# Patient Record
Sex: Female | Born: 1960 | Race: Black or African American | Hispanic: No | Marital: Single | Smoking: Current some day smoker
Health system: Southern US, Community
[De-identification: ages and names within clinical notes are randomized; demographics above are authoritative.]

## PROBLEM LIST (undated history)

## (undated) DIAGNOSIS — R7689 Other specified abnormal immunological findings in serum: Secondary | ICD-10-CM

## (undated) DIAGNOSIS — K219 Gastro-esophageal reflux disease without esophagitis: Secondary | ICD-10-CM

## (undated) DIAGNOSIS — T8859XA Other complications of anesthesia, initial encounter: Secondary | ICD-10-CM

## (undated) DIAGNOSIS — M1990 Unspecified osteoarthritis, unspecified site: Secondary | ICD-10-CM

## (undated) DIAGNOSIS — M62830 Muscle spasm of back: Secondary | ICD-10-CM

## (undated) DIAGNOSIS — Z9889 Other specified postprocedural states: Secondary | ICD-10-CM

## (undated) DIAGNOSIS — L309 Dermatitis, unspecified: Secondary | ICD-10-CM

## (undated) DIAGNOSIS — B999 Unspecified infectious disease: Secondary | ICD-10-CM

## (undated) DIAGNOSIS — R0609 Other forms of dyspnea: Secondary | ICD-10-CM

## (undated) DIAGNOSIS — I10 Essential (primary) hypertension: Secondary | ICD-10-CM

## (undated) DIAGNOSIS — E119 Type 2 diabetes mellitus without complications: Secondary | ICD-10-CM

## (undated) DIAGNOSIS — R768 Other specified abnormal immunological findings in serum: Secondary | ICD-10-CM

## (undated) DIAGNOSIS — M62838 Other muscle spasm: Secondary | ICD-10-CM

## (undated) DIAGNOSIS — E7800 Pure hypercholesterolemia, unspecified: Secondary | ICD-10-CM

## (undated) DIAGNOSIS — T4145XA Adverse effect of unspecified anesthetic, initial encounter: Secondary | ICD-10-CM

## (undated) DIAGNOSIS — R112 Nausea with vomiting, unspecified: Secondary | ICD-10-CM

## (undated) DIAGNOSIS — H409 Unspecified glaucoma: Secondary | ICD-10-CM

## (undated) DIAGNOSIS — R0600 Dyspnea, unspecified: Secondary | ICD-10-CM

## (undated) HISTORY — DX: Pure hypercholesterolemia, unspecified: E78.00

## (undated) HISTORY — PX: TUBAL LIGATION: SHX77

## (undated) HISTORY — PX: SHOULDER SURGERY: SHX246

## (undated) HISTORY — DX: Other specified abnormal immunological findings in serum: R76.89

## (undated) HISTORY — DX: Unspecified glaucoma: H40.9

## (undated) HISTORY — DX: Other forms of dyspnea: R06.09

## (undated) HISTORY — DX: Essential (primary) hypertension: I10

## (undated) HISTORY — DX: Other specified abnormal immunological findings in serum: R76.8

## (undated) HISTORY — PX: JOINT REPLACEMENT: SHX530

## (undated) HISTORY — PX: MOUTH SURGERY: SHX715

## (undated) HISTORY — DX: Type 2 diabetes mellitus without complications: E11.9

## (undated) HISTORY — DX: Dyspnea, unspecified: R06.00

---

## 2000-12-26 ENCOUNTER — Encounter: Payer: Self-pay | Admitting: Internal Medicine

## 2000-12-26 ENCOUNTER — Emergency Department (HOSPITAL_COMMUNITY): Admission: EM | Admit: 2000-12-26 | Discharge: 2000-12-26 | Payer: Self-pay | Admitting: Internal Medicine

## 2001-08-15 ENCOUNTER — Emergency Department (HOSPITAL_COMMUNITY): Admission: EM | Admit: 2001-08-15 | Discharge: 2001-08-15 | Payer: Self-pay | Admitting: *Deleted

## 2001-10-01 ENCOUNTER — Emergency Department (HOSPITAL_COMMUNITY): Admission: EM | Admit: 2001-10-01 | Discharge: 2001-10-01 | Payer: Self-pay | Admitting: Emergency Medicine

## 2001-10-05 ENCOUNTER — Encounter: Payer: Self-pay | Admitting: Emergency Medicine

## 2001-10-05 ENCOUNTER — Emergency Department (HOSPITAL_COMMUNITY): Admission: EM | Admit: 2001-10-05 | Discharge: 2001-10-05 | Payer: Self-pay | Admitting: Emergency Medicine

## 2001-11-06 ENCOUNTER — Emergency Department (HOSPITAL_COMMUNITY): Admission: EM | Admit: 2001-11-06 | Discharge: 2001-11-06 | Payer: Self-pay | Admitting: Emergency Medicine

## 2001-11-24 ENCOUNTER — Emergency Department (HOSPITAL_COMMUNITY): Admission: EM | Admit: 2001-11-24 | Discharge: 2001-11-24 | Payer: Self-pay | Admitting: *Deleted

## 2002-05-03 ENCOUNTER — Emergency Department (HOSPITAL_COMMUNITY): Admission: EM | Admit: 2002-05-03 | Discharge: 2002-05-03 | Payer: Self-pay | Admitting: Emergency Medicine

## 2002-08-05 ENCOUNTER — Emergency Department (HOSPITAL_COMMUNITY): Admission: EM | Admit: 2002-08-05 | Discharge: 2002-08-05 | Payer: Self-pay | Admitting: Emergency Medicine

## 2002-08-22 ENCOUNTER — Emergency Department (HOSPITAL_COMMUNITY): Admission: EM | Admit: 2002-08-22 | Discharge: 2002-08-22 | Payer: Self-pay | Admitting: Emergency Medicine

## 2002-09-18 ENCOUNTER — Emergency Department (HOSPITAL_COMMUNITY): Admission: EM | Admit: 2002-09-18 | Discharge: 2002-09-18 | Payer: Self-pay | Admitting: Emergency Medicine

## 2002-10-03 ENCOUNTER — Emergency Department (HOSPITAL_COMMUNITY): Admission: EM | Admit: 2002-10-03 | Discharge: 2002-10-03 | Payer: Self-pay | Admitting: Emergency Medicine

## 2003-01-07 ENCOUNTER — Emergency Department (HOSPITAL_COMMUNITY): Admission: EM | Admit: 2003-01-07 | Discharge: 2003-01-07 | Payer: Self-pay | Admitting: Internal Medicine

## 2003-01-17 ENCOUNTER — Ambulatory Visit (HOSPITAL_COMMUNITY): Admission: RE | Admit: 2003-01-17 | Discharge: 2003-01-17 | Payer: Self-pay | Admitting: Orthopaedic Surgery

## 2003-01-17 ENCOUNTER — Encounter: Payer: Self-pay | Admitting: Orthopaedic Surgery

## 2003-05-28 ENCOUNTER — Emergency Department (HOSPITAL_COMMUNITY): Admission: EM | Admit: 2003-05-28 | Discharge: 2003-05-28 | Payer: Self-pay | Admitting: Emergency Medicine

## 2003-05-28 ENCOUNTER — Encounter: Payer: Self-pay | Admitting: Emergency Medicine

## 2003-06-23 ENCOUNTER — Emergency Department (HOSPITAL_COMMUNITY): Admission: EM | Admit: 2003-06-23 | Discharge: 2003-06-23 | Payer: Self-pay | Admitting: Emergency Medicine

## 2003-10-28 ENCOUNTER — Emergency Department (HOSPITAL_COMMUNITY): Admission: EM | Admit: 2003-10-28 | Discharge: 2003-10-28 | Payer: Self-pay | Admitting: Emergency Medicine

## 2004-01-20 ENCOUNTER — Emergency Department (HOSPITAL_COMMUNITY): Admission: EM | Admit: 2004-01-20 | Discharge: 2004-01-20 | Payer: Self-pay | Admitting: *Deleted

## 2004-03-05 ENCOUNTER — Emergency Department (HOSPITAL_COMMUNITY): Admission: EM | Admit: 2004-03-05 | Discharge: 2004-03-05 | Payer: Self-pay | Admitting: Emergency Medicine

## 2004-05-27 ENCOUNTER — Emergency Department (HOSPITAL_COMMUNITY): Admission: EM | Admit: 2004-05-27 | Discharge: 2004-05-27 | Payer: Self-pay | Admitting: Emergency Medicine

## 2004-06-27 ENCOUNTER — Emergency Department (HOSPITAL_COMMUNITY): Admission: EM | Admit: 2004-06-27 | Discharge: 2004-06-27 | Payer: Self-pay | Admitting: Emergency Medicine

## 2004-07-16 ENCOUNTER — Ambulatory Visit (HOSPITAL_COMMUNITY): Admission: RE | Admit: 2004-07-16 | Discharge: 2004-07-16 | Payer: Self-pay | Admitting: Orthopaedic Surgery

## 2005-05-09 ENCOUNTER — Emergency Department (HOSPITAL_COMMUNITY): Admission: EM | Admit: 2005-05-09 | Discharge: 2005-05-09 | Payer: Self-pay | Admitting: Emergency Medicine

## 2005-12-24 ENCOUNTER — Emergency Department (HOSPITAL_COMMUNITY): Admission: EM | Admit: 2005-12-24 | Discharge: 2005-12-24 | Payer: Self-pay | Admitting: Emergency Medicine

## 2006-02-24 ENCOUNTER — Emergency Department (HOSPITAL_COMMUNITY): Admission: EM | Admit: 2006-02-24 | Discharge: 2006-02-24 | Payer: Self-pay | Admitting: Emergency Medicine

## 2006-04-19 ENCOUNTER — Emergency Department (HOSPITAL_COMMUNITY): Admission: EM | Admit: 2006-04-19 | Discharge: 2006-04-19 | Payer: Self-pay | Admitting: Emergency Medicine

## 2006-10-02 ENCOUNTER — Emergency Department (HOSPITAL_COMMUNITY): Admission: EM | Admit: 2006-10-02 | Discharge: 2006-10-02 | Payer: Self-pay | Admitting: Emergency Medicine

## 2007-08-28 ENCOUNTER — Emergency Department (HOSPITAL_COMMUNITY): Admission: EM | Admit: 2007-08-28 | Discharge: 2007-08-28 | Payer: Self-pay | Admitting: Emergency Medicine

## 2007-09-26 ENCOUNTER — Ambulatory Visit (HOSPITAL_COMMUNITY): Admission: RE | Admit: 2007-09-26 | Discharge: 2007-09-26 | Payer: Self-pay | Admitting: Family Medicine

## 2008-10-21 ENCOUNTER — Emergency Department (HOSPITAL_COMMUNITY): Admission: EM | Admit: 2008-10-21 | Discharge: 2008-10-21 | Payer: Self-pay | Admitting: Emergency Medicine

## 2008-11-07 ENCOUNTER — Emergency Department (HOSPITAL_COMMUNITY): Admission: EM | Admit: 2008-11-07 | Discharge: 2008-11-07 | Payer: Self-pay | Admitting: Emergency Medicine

## 2009-03-24 ENCOUNTER — Emergency Department (HOSPITAL_COMMUNITY): Admission: EM | Admit: 2009-03-24 | Discharge: 2009-03-24 | Payer: Self-pay | Admitting: Emergency Medicine

## 2009-04-09 ENCOUNTER — Emergency Department (HOSPITAL_COMMUNITY): Admission: EM | Admit: 2009-04-09 | Discharge: 2009-04-09 | Payer: Self-pay | Admitting: Emergency Medicine

## 2009-07-31 ENCOUNTER — Emergency Department (HOSPITAL_COMMUNITY): Admission: EM | Admit: 2009-07-31 | Discharge: 2009-07-31 | Payer: Self-pay | Admitting: Emergency Medicine

## 2009-11-21 ENCOUNTER — Emergency Department (HOSPITAL_COMMUNITY): Admission: EM | Admit: 2009-11-21 | Discharge: 2009-11-21 | Payer: Self-pay | Admitting: Emergency Medicine

## 2010-02-08 ENCOUNTER — Emergency Department (HOSPITAL_COMMUNITY): Admission: EM | Admit: 2010-02-08 | Discharge: 2010-02-08 | Payer: Self-pay | Admitting: Emergency Medicine

## 2010-04-13 ENCOUNTER — Emergency Department (HOSPITAL_COMMUNITY): Admission: EM | Admit: 2010-04-13 | Discharge: 2010-04-13 | Payer: Self-pay | Admitting: Emergency Medicine

## 2010-06-14 ENCOUNTER — Emergency Department (HOSPITAL_COMMUNITY): Admission: EM | Admit: 2010-06-14 | Discharge: 2010-06-14 | Payer: Self-pay | Admitting: Emergency Medicine

## 2010-07-14 ENCOUNTER — Emergency Department (HOSPITAL_COMMUNITY): Admission: EM | Admit: 2010-07-14 | Discharge: 2010-07-14 | Payer: Self-pay | Admitting: Emergency Medicine

## 2010-09-22 ENCOUNTER — Ambulatory Visit (HOSPITAL_COMMUNITY)
Admission: RE | Admit: 2010-09-22 | Discharge: 2010-09-22 | Payer: Self-pay | Source: Home / Self Care | Attending: Family Medicine | Admitting: Family Medicine

## 2011-01-01 ENCOUNTER — Emergency Department (HOSPITAL_COMMUNITY)
Admission: EM | Admit: 2011-01-01 | Discharge: 2011-01-01 | Disposition: A | Discharge status: Home / Self Care | Payer: Self-pay | Attending: Emergency Medicine | Admitting: Emergency Medicine

## 2011-01-01 DIAGNOSIS — L259 Unspecified contact dermatitis, unspecified cause: Secondary | ICD-10-CM | POA: Insufficient documentation

## 2011-01-01 DIAGNOSIS — M129 Arthropathy, unspecified: Secondary | ICD-10-CM | POA: Insufficient documentation

## 2011-01-01 DIAGNOSIS — Z79899 Other long term (current) drug therapy: Secondary | ICD-10-CM | POA: Insufficient documentation

## 2011-07-07 ENCOUNTER — Emergency Department (HOSPITAL_COMMUNITY)
Admission: EM | Admit: 2011-07-07 | Discharge: 2011-07-07 | Disposition: A | Discharge status: Home / Self Care | Payer: Medicaid Other | Attending: Emergency Medicine | Admitting: Emergency Medicine

## 2011-07-07 ENCOUNTER — Encounter: Payer: Self-pay | Admitting: Emergency Medicine

## 2011-07-07 DIAGNOSIS — F17200 Nicotine dependence, unspecified, uncomplicated: Secondary | ICD-10-CM | POA: Insufficient documentation

## 2011-07-07 DIAGNOSIS — J029 Acute pharyngitis, unspecified: Secondary | ICD-10-CM

## 2011-07-07 DIAGNOSIS — L259 Unspecified contact dermatitis, unspecified cause: Secondary | ICD-10-CM | POA: Insufficient documentation

## 2011-07-07 DIAGNOSIS — M129 Arthropathy, unspecified: Secondary | ICD-10-CM | POA: Insufficient documentation

## 2011-07-07 HISTORY — DX: Dermatitis, unspecified: L30.9

## 2011-07-07 HISTORY — DX: Unspecified osteoarthritis, unspecified site: M19.90

## 2011-07-07 LAB — RAPID STREP SCREEN (MED CTR MEBANE ONLY): Streptococcus, Group A Screen (Direct): NEGATIVE

## 2011-07-07 MED ORDER — TRIAMCINOLONE ACETONIDE 0.1 % EX CREA: TOPICAL_CREAM | Freq: Two times a day (BID) | CUTANEOUS | Status: DC

## 2011-07-07 MED ORDER — IBUPROFEN 600 MG PO TABS: 600.0000 mg | ORAL_TABLET | Freq: Two times a day (BID) | ORAL | Status: AC

## 2011-07-07 MED ORDER — METHYLPREDNISOLONE SODIUM SUCC 125 MG IJ SOLR
125.0000 mg | Freq: Once | INTRAMUSCULAR | Status: AC
  Administered 2011-07-07: 125 mg via INTRAMUSCULAR
  Filled 2011-07-07: qty 2

## 2011-07-07 NOTE — ED Notes (Signed)
No adverse reaction at injection site.

## 2011-07-07 NOTE — ED Notes (Signed)
Patient c/o sore throat and bilateral ear pain that started yesterday. Per patient had "bumps in ear" with green drainage that "busted." Patient also requesting refill prescription on triamcinolone for her eczema and ibuprofen 600mg  for her arthritis.

## 2011-07-07 NOTE — ED Provider Notes (Signed)
History    Scribed for Benny Lennert, MD, the patient was seen in room APA03/APA03. This chart was scribed by Katha Cabal.   CSN: [id number] Arrival date & time: 07/07/2011  9:17 AM   First MD Initiated Contact with Patient 07/07/11 [id number]      Chief Complaint  Patient presents with  . Sore Throat  . Otalgia  . Medication Refill    (Consider location/radiation/quality/duration/timing/severity/associated sxs/prior treatment) Patient is a 50 y.o. female presenting with pharyngitis and ear pain. The history is provided by the patient. No language interpreter was used.  Sore Throat This is a new problem. The current episode started yesterday. The problem has not changed since onset.Pertinent negatives include no abdominal pain. The symptoms are aggravated by swallowing. The symptoms are relieved by nothing. She has tried nothing for the symptoms.  Otalgia This is a new problem. The current episode started more than 2 days ago. There is pain in both ears. The problem has been gradually improving (Patient reports "green discharge that ran out" of  bumps that  patient released using a hairpin.  Patient treated  ears with hydrrogen perroxide. Reports improvement. ). There has been no fever. The pain is mild. Associated symptoms include sore throat and cough. Pertinent negatives include no ear discharge, no abdominal pain, no diarrhea and no rash.    Past Medical History  Diagnosis Date  . Arthritis   . Eczema     History reviewed. No pertinent past surgical history.  History reviewed. No pertinent family history.  History  Substance Use Topics  . Smoking status: Current Everyday Smoker -- 0.1 packs/day for 1 years    Types: Cigarettes  . Smokeless tobacco: Never Used  . Alcohol Use: No    OB History    Grav Para Term Preterm Abortions TAB SAB Ect Mult Living   6 6 6       6       Review of Systems  Constitutional: Negative for fatigue.  HENT: Positive for ear pain, sore  throat and trouble swallowing. Negative for congestion, sinus pressure and ear discharge.   Eyes: Negative for discharge.  Respiratory: Positive for cough.   Gastrointestinal: Negative for abdominal pain and diarrhea.  Genitourinary: Negative for frequency and hematuria.  Musculoskeletal: Negative for back pain.  Skin: Negative for rash.  Neurological: Negative for seizures.  Hematological: Negative.   Psychiatric/Behavioral: Negative for hallucinations.    Allergies  Review of patient's allergies indicates no known allergies.  Home Medications   Current Outpatient Rx  Name Route Sig Dispense Refill  . BC FAST PAIN RELIEF PO Oral Take 1 packet by mouth 2 (two) times daily as needed. Pain     . IBUPROFEN 800 MG PO TABS Oral Take 800 mg by mouth every 8 (eight) hours as needed. Pain     . NEOMYCIN-COLIST-HC-THONZONIUM 3.10-31-08-0.5 MG/ML OT SUSP Both Ears Place 3 drops into both ears 3 (three) times daily as needed. Ear Pain     . IBUPROFEN 600 MG PO TABS Oral Take 1 tablet (600 mg total) by mouth 2 (two) times daily. 60 tablet 0  . TRIAMCINOLONE ACETONIDE 0.1 % EX CREA Topical Apply topically 2 (two) times daily. 240 g 0    BP 118/68  Pulse 88  Temp(Src) 98.9 F (37.2 C) (Oral)  Resp 19  Ht 5\' 2"  (1.575 m)  Wt 191 lb 4.8 oz (86.773 kg)  BMI 34.99 kg/m2  SpO2 100%  LMP 06/28/2011  Physical Exam  Constitutional:  She is oriented to person, place, and time. She appears well-developed and well-nourished. No distress.  HENT:  Head: Normocephalic and atraumatic.  Right Ear: Tympanic membrane normal.  Left Ear: Tympanic membrane normal.  Mouth/Throat: Posterior oropharyngeal erythema (mild ) present. No oropharyngeal exudate or posterior oropharyngeal edema.  Eyes: Conjunctivae and EOM are normal. No scleral icterus.  Neck: Neck supple. No thyromegaly present.  Cardiovascular: Normal rate, regular rhythm and intact distal pulses.  Exam reveals no gallop and no friction rub.   No  murmur heard. Pulmonary/Chest: Effort normal and breath sounds normal. No stridor. No respiratory distress. She has no wheezes. She has no rales. She exhibits no tenderness.  Abdominal: Soft. She exhibits no distension. There is no tenderness. There is no rebound.  Musculoskeletal: Normal range of motion. She exhibits no edema.  Lymphadenopathy:    She has no cervical adenopathy.  Neurological: She is alert and oriented to person, place, and time. Coordination normal.  Skin: No rash noted. No erythema.  Psychiatric: She has a normal mood and affect. Her behavior is normal.    ED Course  Procedures (including critical care time)   DIAGNOSTIC STUDIES: Oxygen Saturation is 100% on room air, normal by my interpretation.    COORDINATION OF CARE:  9: 38 AM  Physical exam complete.  Rapid strep.  Will provide prescriptions for eczema and ibuprofen medications.   10:18 AM  Plan to discharge patient.  Patient agrees with plan.    Orders Placed This Encounter  Procedures  . Rapid strep screen     LABS / RADIOLOGY:     Labs Reviewed  RAPID STREP SCREEN   Results for orders placed during the hospital encounter of 07/07/11  RAPID STREP SCREEN      Component Value Range   Streptococcus, Group A Screen (Direct) NEGATIVE  NEGATIVE     No results found.       MDM   MDM: pharyngitis.  Possible chemical irritation  Pt states she had been cleaning with a chemical  And was coughing and had irritation in throat. The pharyngitis could be inflamation from the chemical Pt also needed refill of motrin and steroid cream  MEDICATIONS GIVEN IN THE E.D. Scheduled Meds:    . methylPREDNISolone (SOLU-MEDROL) injection  125 mg Intramuscular Once         IMPRESSION: 1. Pharyngitis       The chart was scribed for me under my direct supervision.  I personally performed the history, physical, and medical decision making and all procedures in the evaluation of this  patient.Benny Lennert, MD 07/07/11 1025

## 2011-08-11 ENCOUNTER — Ambulatory Visit (INDEPENDENT_AMBULATORY_CARE_PROVIDER_SITE_OTHER): Payer: Medicaid Other | Admitting: Orthopedic Surgery

## 2011-08-11 ENCOUNTER — Encounter: Payer: Self-pay | Admitting: Orthopedic Surgery

## 2011-08-11 VITALS — BP 140/80 | Ht 62.0 in | Wt 191.0 lb

## 2011-08-11 DIAGNOSIS — M259 Joint disorder, unspecified: Secondary | ICD-10-CM

## 2011-08-11 DIAGNOSIS — R2230 Localized swelling, mass and lump, unspecified upper limb: Secondary | ICD-10-CM

## 2011-08-13 ENCOUNTER — Encounter: Payer: Self-pay | Admitting: Orthopedic Surgery

## 2011-08-13 DIAGNOSIS — R2230 Localized swelling, mass and lump, unspecified upper limb: Secondary | ICD-10-CM | POA: Insufficient documentation

## 2011-08-13 NOTE — Patient Instructions (Signed)
You have an MRI of her LEFT shoulder and then results will be relayed to you

## 2011-08-13 NOTE — Progress Notes (Addendum)
  Subjective:    Patient ID: [name], female    DOB: 1961-04-13, 50 y.o.   MRN: [id number]  Shoulder Pain  The pain is present in the left shoulder. This is a chronic problem. The current episode started more than 1 year ago. There has been no history of extremity trauma. The problem occurs constantly. The problem has been gradually worsening. The quality of the pain is described as pounding and sharp. The pain is at a severity of 10/10. The pain is severe. Associated symptoms include a fever, joint locking, a limited range of motion and numbness. The symptoms are aggravated by lying down. Family history does not include gout or rheumatoid arthritis.      Review of Systems  Constitutional: Positive for fever and chills.  Respiratory: Positive for cough and wheezing.   Cardiovascular: Positive for chest pain.  Genitourinary: Negative.   Musculoskeletal: Positive for joint swelling and arthralgias.  Skin: Positive for rash.  Neurological: Positive for numbness.  Hematological: Positive for adenopathy. Bruises/bleeds easily.  Psychiatric/Behavioral: Negative.        Objective:   Physical Exam  Constitutional: She is oriented to person, place, and time. She appears well-developed and well-nourished.  Neck: Normal range of motion.  Musculoskeletal:       Left shoulder: She exhibits decreased range of motion, tenderness, swelling, crepitus, deformity and pain. She exhibits normal pulse and normal strength.       Left elbow: Normal.       Left wrist: Normal.       Arms:      Fluid-filled mass over the LEFT shoulder primarily over the deltoid area and clavicle distally.  It is soft fluctuant and measures 6 x 6 inches.    Lymphadenopathy:    She has no cervical adenopathy.    She has no axillary adenopathy.  Neurological: She is alert and oriented to person, place, and time. She has normal reflexes.  Skin: Skin is warm and dry.  Psychiatric: She has a normal mood and affect. Her  behavior is normal. Judgment and thought content normal.          Assessment & Plan:  Mass LEFT shoulder  Plan MRI.  Separate note for x-ray AP lateral LEFT shoulder  Glenohumeral joint is normal with some irregularity at the end of the humeral head inferiorly which may represent an early osteophyte.  The acromion is slightly curved but not hooked.  Soft tissue swelling is noted over the shoulder.  Impression fairly normal-appearing glenohumeral joint

## 2011-09-07 ENCOUNTER — Other Ambulatory Visit: Payer: Self-pay | Admitting: *Deleted

## 2011-09-07 DIAGNOSIS — Z01812 Encounter for preprocedural laboratory examination: Secondary | ICD-10-CM

## 2011-09-09 ENCOUNTER — Ambulatory Visit (HOSPITAL_COMMUNITY)
Admission: RE | Admit: 2011-09-09 | Discharge: 2011-09-09 | Disposition: A | Discharge status: Home / Self Care | Payer: Medicaid Other | Source: Ambulatory Visit | Attending: Orthopedic Surgery | Admitting: Orthopedic Surgery

## 2011-09-09 DIAGNOSIS — R2230 Localized swelling, mass and lump, unspecified upper limb: Secondary | ICD-10-CM

## 2011-09-10 ENCOUNTER — Telehealth: Payer: Self-pay | Admitting: Orthopedic Surgery

## 2011-09-10 NOTE — Telephone Encounter (Signed)
[name] mother came by today with [name] and said that she went for the MRI but was not able to get it done.   Said she will need to go  to [facility] for an open unit.  Explained to her that you  will have to start over with the precert   process   And it will several  Days before you can get to this

## 2011-09-18 ENCOUNTER — Other Ambulatory Visit (HOSPITAL_COMMUNITY): Payer: Self-pay | Admitting: Family Medicine

## 2011-09-18 DIAGNOSIS — Z139 Encounter for screening, unspecified: Secondary | ICD-10-CM

## 2011-09-21 ENCOUNTER — Other Ambulatory Visit: Payer: Self-pay | Admitting: *Deleted

## 2011-09-21 DIAGNOSIS — R2230 Localized swelling, mass and lump, unspecified upper limb: Secondary | ICD-10-CM

## 2011-09-25 ENCOUNTER — Ambulatory Visit (HOSPITAL_COMMUNITY)
Admission: RE | Admit: 2011-09-25 | Discharge: 2011-09-25 | Disposition: A | Discharge status: Home / Self Care | Payer: Medicaid Other | Source: Ambulatory Visit | Attending: Family Medicine | Admitting: Family Medicine

## 2011-09-25 DIAGNOSIS — Z139 Encounter for screening, unspecified: Secondary | ICD-10-CM

## 2011-09-25 DIAGNOSIS — Z1231 Encounter for screening mammogram for malignant neoplasm of breast: Secondary | ICD-10-CM | POA: Insufficient documentation

## 2011-10-05 ENCOUNTER — Other Ambulatory Visit: Payer: Self-pay | Admitting: *Deleted

## 2011-10-05 DIAGNOSIS — R2230 Localized swelling, mass and lump, unspecified upper limb: Secondary | ICD-10-CM

## 2011-10-06 ENCOUNTER — Telehealth: Payer: Self-pay | Admitting: Radiology

## 2011-10-06 NOTE — Telephone Encounter (Signed)
I called to give the patient her MRI appointment at [facility] on 10-12-11 at 2:45. She has Medicaid, authorization #[id number]. Patient is  aware.

## 2011-10-12 ENCOUNTER — Other Ambulatory Visit: Payer: Medicaid Other

## 2011-10-13 ENCOUNTER — Telehealth: Payer: Self-pay | Admitting: Orthopedic Surgery

## 2011-10-13 NOTE — Telephone Encounter (Signed)
Received call from [place] at [facility] ph [phone], needing to verify which test is scheduled to be performed tomorrow 10/13/12 for "evaluate a mass". States they normally do a "with and without contrast MRI for this problem (which is one of the orders in patient's chart?   Is it an arthrogram (as also ordered) or is it an MRI w/and w/o contrast?  Please advise.  States either is fine as they can do it tomorrow as scheduled.

## 2011-10-14 ENCOUNTER — Ambulatory Visit
Admission: RE | Admit: 2011-10-14 | Discharge: 2011-10-14 | Disposition: A | Discharge status: Home / Self Care | Payer: Medicaid Other | Source: Ambulatory Visit | Attending: Orthopedic Surgery | Admitting: Orthopedic Surgery

## 2011-10-14 ENCOUNTER — Telehealth: Payer: Self-pay | Admitting: Orthopedic Surgery

## 2011-10-14 DIAGNOSIS — R2230 Localized swelling, mass and lump, unspecified upper limb: Secondary | ICD-10-CM

## 2011-10-14 MED ORDER — DIAZEPAM 10 MG PO TABS: ORAL_TABLET | ORAL | Status: DC

## 2011-10-14 NOTE — Telephone Encounter (Signed)
Order for MRI sent.

## 2011-10-14 NOTE — Telephone Encounter (Signed)
Ok

## 2011-10-14 NOTE — Telephone Encounter (Signed)
Judeth Cornfield with [facility] called this afternoon for [employer].  Annslee was scheduled for her MRI today and could not get it done Because she was too anxious.  [place] wants you to call her something in to relax her.  She uses [facility] in [place]. Judeth Cornfield said she will reschedule her once she gets the medicine. Deyana's # [phone]

## 2011-10-14 NOTE — Telephone Encounter (Signed)
Check into this

## 2011-10-23 ENCOUNTER — Ambulatory Visit
Admission: RE | Admit: 2011-10-23 | Discharge: 2011-10-23 | Disposition: A | Discharge status: Home / Self Care | Payer: Medicaid Other | Source: Ambulatory Visit | Attending: Orthopedic Surgery | Admitting: Orthopedic Surgery

## 2011-10-23 MED ORDER — GADOBENATE DIMEGLUMINE 529 MG/ML IV SOLN
18.0000 mL | Freq: Once | INTRAVENOUS | Status: AC | PRN
  Administered 2011-10-23: 18 mL via INTRAVENOUS

## 2011-10-28 ENCOUNTER — Ambulatory Visit (HOSPITAL_COMMUNITY)
Admission: RE | Admit: 2011-10-28 | Discharge: 2011-10-28 | Disposition: A | Discharge status: Home / Self Care | Payer: Medicaid Other | Source: Ambulatory Visit | Attending: Family Medicine | Admitting: Family Medicine

## 2011-10-28 ENCOUNTER — Other Ambulatory Visit (HOSPITAL_COMMUNITY): Payer: Self-pay | Admitting: Family Medicine

## 2011-10-28 DIAGNOSIS — M549 Dorsalgia, unspecified: Secondary | ICD-10-CM

## 2011-10-28 DIAGNOSIS — M5137 Other intervertebral disc degeneration, lumbosacral region: Secondary | ICD-10-CM | POA: Insufficient documentation

## 2011-10-28 DIAGNOSIS — M545 Low back pain, unspecified: Secondary | ICD-10-CM | POA: Insufficient documentation

## 2011-10-28 DIAGNOSIS — M546 Pain in thoracic spine: Secondary | ICD-10-CM | POA: Insufficient documentation

## 2011-10-28 DIAGNOSIS — M51379 Other intervertebral disc degeneration, lumbosacral region without mention of lumbar back pain or lower extremity pain: Secondary | ICD-10-CM | POA: Insufficient documentation

## 2011-10-28 DIAGNOSIS — R079 Chest pain, unspecified: Secondary | ICD-10-CM | POA: Insufficient documentation

## 2011-11-04 ENCOUNTER — Ambulatory Visit (INDEPENDENT_AMBULATORY_CARE_PROVIDER_SITE_OTHER): Payer: Medicaid Other | Admitting: Orthopedic Surgery

## 2011-11-04 ENCOUNTER — Telehealth: Payer: Self-pay | Admitting: Orthopedic Surgery

## 2011-11-04 ENCOUNTER — Encounter: Payer: Self-pay | Admitting: Orthopedic Surgery

## 2011-11-04 VITALS — BP 122/64 | Ht 62.0 in | Wt 191.0 lb

## 2011-11-04 DIAGNOSIS — R2230 Localized swelling, mass and lump, unspecified upper limb: Secondary | ICD-10-CM

## 2011-11-04 DIAGNOSIS — M259 Joint disorder, unspecified: Secondary | ICD-10-CM

## 2011-11-04 NOTE — Telephone Encounter (Signed)
Faxed referral to Dr. Malvin Johns to Fax# [phone]; appointment has already been scheduled for 11/11/11 at 2:00PM per Dr. Romeo Apple, spoke w/Linda, nurse.

## 2011-11-04 NOTE — Progress Notes (Signed)
Patient ID: [name], female   DOB: 09-20-1960, 51 y.o.   MRN: [id number] Chief Complaint  Patient presents with  . Follow-up    MRI results of left shoulder.   Large LEFT shoulder mass, consistent with lipoma.  The mass appears to have gotten bigger. After the MRI was done.  Recommend general surgery consult for removal

## 2011-11-04 NOTE — Patient Instructions (Signed)
Will follow with Dr Malvin Johns weds at 2 pm

## 2011-11-05 ENCOUNTER — Telehealth: Payer: Self-pay | Admitting: *Deleted

## 2011-11-05 DIAGNOSIS — R2230 Localized swelling, mass and lump, unspecified upper limb: Secondary | ICD-10-CM

## 2011-11-05 NOTE — Telephone Encounter (Signed)
Message copied by Diamantina Monks on Thu Nov 05, 2011  2:22 PM ------      Message from: Cammie Sickle A      Created: Wed Nov 04, 2011  1:13 PM       Marijean Niemann,       Medical referral needs to be entered for [employer].  Dr. Romeo Apple already made the referral appt to Dr. Malvin Johns. I ent'd a front office ref only. Thanks,Carol

## 2011-11-12 ENCOUNTER — Encounter (HOSPITAL_COMMUNITY)
Admission: RE | Admit: 2011-11-12 | Discharge: 2011-11-12 | Disposition: A | Discharge status: Home / Self Care | Payer: Medicaid Other | Source: Ambulatory Visit | Attending: General Surgery | Admitting: General Surgery

## 2011-11-12 ENCOUNTER — Encounter (HOSPITAL_COMMUNITY): Payer: Self-pay

## 2011-11-12 HISTORY — DX: Muscle spasm of back: M62.830

## 2011-11-12 LAB — DIFFERENTIAL
Basophils Absolute: 0.1 10*3/uL (ref 0.0–0.1)
Basophils Relative: 0 % (ref 0–1)
Eosinophils Absolute: 0.4 10*3/uL (ref 0.0–0.7)
Eosinophils Relative: 2 % (ref 0–5)
Lymphocytes Relative: 16 % (ref 12–46)
Monocytes Absolute: 1 10*3/uL (ref 0.1–1.0)

## 2011-11-12 LAB — CBC
HCT: 34.3 % — ABNORMAL LOW (ref 36.0–46.0)
MCH: 24.7 pg — ABNORMAL LOW (ref 26.0–34.0)
MCHC: 32.1 g/dL (ref 30.0–36.0)
MCV: 76.9 fL — ABNORMAL LOW (ref 78.0–100.0)
Platelets: 390 10*3/uL (ref 150–400)
RDW: 16.4 % — ABNORMAL HIGH (ref 11.5–15.5)

## 2011-11-12 LAB — BASIC METABOLIC PANEL
CO2: 26 mEq/L (ref 19–32)
Calcium: 9.6 mg/dL (ref 8.4–10.5)
Creatinine, Ser: 0.79 mg/dL (ref 0.50–1.10)
GFR calc non Af Amer: >90 mL/min (ref >90)

## 2011-11-12 LAB — SURGICAL PCR SCREEN: MRSA, PCR: POSITIVE — AB

## 2011-11-12 MED ORDER — CHLORHEXIDINE GLUCONATE 4 % EX LIQD
1.0000 "application " | Freq: Once | CUTANEOUS | Status: DC
  Filled 2011-11-12: qty 15

## 2011-11-12 NOTE — Consult Note (Signed)
[name], [name] NO.:  [id number]  MEDICAL RECORD NO.:  [id number]  LOCATION:                                 FACILITY:  PHYSICIAN:  Barbaraann Barthel, M.D. DATE OF BIRTH:  [birth year]  DATE OF CONSULTATION:  11/11/2011 DATE OF DISCHARGE:                                CONSULTATION   DIAGNOSIS:  Large neoplasm, left shoulder.  HISTORY OF PRESENT MEDICAL ILLNESS:  The patient states that she has had a mass that had continued to grow on her left shoulder since she was in her teenage years.  She recently saw the orthopedic surgeon who referred her to me.  In essence, this appeared to be a large subcutaneous mass extending from approximately the mid shoulder on the left side to the did deltoid muscle at least 10 or 11 cm in diameter and at least 5-7 cm in width.  We have reviewed the MRI of this.  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  We discussed removing this as an outpatient.  I discussed the need for a for a drain.  Discussed complications, not limited to, but including bleeding, infection, and the possibility that further surgery might be required.  Informed consent was obtained.  MEDICATIONS:  See med rec sheet.  She has no known drug allergies.  She is a nonsmoker, nondrinker.  PHYSICAL EXAMINATION:  GENERAL: This is a pleasant 51 year old black female. VITAL SIGNS:  She is 5 foot 2, weighs 195 pounds.  Her temperature is 98.7, pulse rate 72 and regular, respirations 12 per minute, blood pressure 120/80. HEAD:  Normocephalic.  EYES:  Extraocular movements are intact.  Pupils were round reactive to light and accommodation.  There is no conjunctive pallor or scleral injection.  The sclerae have a normal tincture. NECK:  No bruits are auscultated.  No adenopathy and no thyromegaly. CHEST:  Clear on both anterior and posterior auscultation. HEART:  Regular rhythm. BREASTS AND AXILLAE:  Without masses. ABDOMEN:  Soft.  There is no  visceromegaly.  No hernias are palpated. RECTAL AND PELVIC:  Deferred. EXTREMITIES:  The patient has chronic knee pain, and she has some swelling on her right knee greater than her left knee.  She is in consultation for these problems with the Orthopedic Service.  REVIEW OF SYSTEMS:  NEURO:  No history of migraines or seizures. ENDOCRINE:  No history of diabetes or thyroid disease.  CARDIOPULMONARY: The patient does have a history of hypertension.  This is controlled at present.  MUSCULOSKELETAL:  The patient is overweight and has chronic knee pain.  OB/GYN HISTORY:  She is a gravida 6, para 6, abortus 0, cesarean 0 female whose last menstrual period was October 30, 2011.  Her last mammogram was in 2013, and she reports as normal.  GI:  No history of hepatitis, constipation, diarrhea, bright red rectal bleeding, melena, or history of inflammatory bowel disease or irritable bowel syndrome.  She has no unexplained weight loss, and she has never had a colonoscopy.  GU:  No history of nephrolithiasis or dysuria or frequency.  REVIEW OF HISTORY AND PHYSICAL:  Therefore, [name] is a 51 year old female who has a large neoplasm on the left  shoulder, and we will plan to remove this via the outpatient department.  I discussed this in detail with her and all questions were answered and informed consent was obtained.     Barbaraann Barthel, M.D.     WB/MEDQ  D:  11/11/2011  T:  11/11/2011  Job:  [id number]  cc:   Vickki Hearing, M.D. Fax: [phone]  Angus G. Renard Matter, MD Fax: [phone]

## 2011-11-12 NOTE — Patient Instructions (Addendum)
Excision of Skin Lesions Excision of a skin lesion refers to the removal of a section of skin by making small cuts (incisions) in the skin. This is typically done to remove a cancerous growth (basal cell carcinoma, squamous cell carcinoma, or melanoma) or a noncancerous growth (cyst). It may be done to treat or prevent cancer or infection. It may also be done to improve cosmetic appearance (removal of mole, skin tag). LET YOUR CAREGIVER KNOW ABOUT:   Allergies to food or medicine.   Medicines taken, including vitamins, herbs, eyedrops, over-the-counter medicines, and creams.   Use of steroids (by mouth or creams).   Previous problems with anesthetics or numbing medicines.   History of bleeding problems or blood clots.   History of any prostheses.   Previous surgery.   Other health problems, including diabetes and kidney problems.   Possibility of pregnancy, if this applies.  RISKS AND COMPLICATIONS  Many complications can be managed. With appropriate treatment and rehabilitation, the following complications are very uncommon:  Bleeding.   Infection.   Scarring.   Recurrence of cyst or cancer.   Changes in skin sensation or appearance (discoloration, swelling).   Reaction to anesthesia.   Allergic reaction to surgical materials or ointments.   Damage to nerves, blood vessels, muscles, or other structures.   Continued pain.  BEFORE THE PROCEDURE  It is important to follow your caregiver's instructions prior to your procedure to avoid complications. Steps before your procedure may include:  Physical exam, blood tests, other procedures, such as removing a small sample for examination under a microscope (biopsy).   Your caregiver may review the procedure, the anesthesia being used, and what to expect after the procedure with you.  You may be asked to:  Stop taking certain medicines, such as blood thinners (including aspirin, clopidogrel, ibuprofen), for several days prior  to your procedure.   Take certain medicines.   Stop smoking.  It is a good idea to arrange for a ride home after surgery and to have someone to help you with activities during recovery. PROCEDURE  There are several excision techniques. The type of excision or surgical technique used will depend on your condition, the location of the lesion, and your overall health. After the lesion is sterilized and a local anesthetic is applied, the following may be performed: Complete surgical excision The area to be removed is marked with a pen. Using a small scalpel and scissors, the surgeon gently cuts around and under the lesion until it is completely removed. The lesion is placed in a special fluid and sent to the lab for examination. If necessary, bleeding will be controlled with a device that delivers heat. The edges of the wound are stitched together and a dressing is applied. This procedure may be performed to treat a cancerous growth or noncancerous cyst or lesion. Surgeons commonly perform an elliptical excision, to minimize scarring. Excision of a cyst The surgeon makes an incision on the cyst. The entire cyst is removed through the incision. The wound may be closed with a suture (stitch). Shave excision During shave excision, the surgeon uses a small blade or loop instrument to shave off the lesion. This may be done to remove a mole or skin tag. The wound is usually left to heal on its own without stitches. Punch excision During punch excision, the surgeon uses a small, round tool (like a cookie cutter) to cut a circle shape out of the skin. The outer edges of the skin are stitched  together. This may be done to remove a mole or scar or to perform a biopsy of the lesion. Mohs micrographic surgery During Mohs micrographic surgery, layers of the lesion are removed with a scalpel or loop instrument and immediately examined under a microscope until all of the abnormal or cancerous tissue is removed. This  procedure is minimally invasive and ensures the best cosmetic outcome, with removal of as little normal tissue as possible. Mohs is usually done to treat skin cancer, such as basal cell carcinoma or squamous cell carcinoma, particularly on the face and ears. Antibiotic ointment is applied to the surgical area after each of the procedures listed above, as necessary. AFTER THE PROCEDURE  How well you heal depends on many factors. Most patients heal quite well with proper techniques and self-care. Scarring will lessen over time. HOME CARE INSTRUCTIONS   Take medicines for pain as directed.   Keep the incision area clean, dry, and protected for at least 48 hours. Change dressings as directed.   For bleeding, apply gentle but firm pressure to the wound using a folded towel for 20 minutes. Call your caregiver if bleeding does not stop.   Avoid high-impact exercise and activities until the stitches are removed or the area heals.   Follow your caregiver's instructions to minimize scarring. Avoid sun exposure until the area has healed. Scarring should lessen over time.   Follow up with your caregiver as directed. Removal of stitches within 4 to 14 days may be necessary.  Finding out the results of your test Not all test results are available during your visit. If your test results are not back during the visit, make an appointment with your caregiver to find out the results. Do not assume everything is normal if you have not heard from your caregiver or the medical facility. It is important for you to follow up on all of your test results. SEEK MEDICAL CARE IF:   You or your child has an oral temperature above 102 F (38.9 C).   You develop signs of infection (chills, feeling unwell).   You notice bleeding, pain, discharge, redness, or swelling at the incision site.   You notice skin irregularities or changes in sensation.  MAKE SURE YOU:   Understand these instructions.   Will watch your  condition.   Will get help right away if you are not doing well or get worse.  FOR MORE INFORMATION  American Academy of [employer]icians: www.https://powers.com/ American Academy of Dermatology: InfoExam.si Document Released: 11/11/2009 Document Revised: 08/06/2011 Document Reviewed: 11/11/2009 [facility] Patient Information 2012 [place], [place].  20 Breawna Montenegro  11/12/2011   Your procedure is scheduled on:  November 16, 2011  [facility] to [facility] [place] AM.  Call this number if you have problems the morning of surgery: [phone]   Remember:   Do not eat food:After Midnight.  May have clear liquids:until Midnight .  Clear liquids include soda, tea, black coffee, apple or grape juice, broth.  Take these medicines the morning of surgery with A SIP OF WATER:Flexeril, Valium (If needed)   Do not wear jewelry, make-up or nail polish.  Do not wear lotions, powders, or perfumes. You may wear deodorant.  Do not shave 48 hours prior to surgery.  Do not bring valuables to the hospital.  Contacts, dentures or bridgework may not be worn into surgery.  Leave suitcase in the car. After surgery it may be brought to your room.  For patients admitted to the hospital, checkout  time is 11:00 AM the day of discharge.   Patients discharged the day of surgery will not be allowed to drive home.  Name and phone number of your driver: Family  Special Instructions: CHG Shower Use Special Wash: 1/2 bottle night before surgery and 1/2 bottle morning of surgery.   Please read over the following fact sheets that you were given: Pain Booklet, Coughing and Deep Breathing, Surgical Site Infection Prevention, Anesthesia Post-op Instructions and Care and Recovery After Surgery    PATIENT INSTRUCTIONS POST-ANESTHESIA  IMMEDIATELY FOLLOWING SURGERY:  Do not drive or operate machinery for the first twenty four hours after surgery.  Do not make any important decisions for twenty four hours after surgery or while taking  narcotic pain medications or sedatives.  If you develop intractable nausea and vomiting or a severe headache please notify your doctor immediately.  FOLLOW-UP:  Please make an appointment with your surgeon as instructed. You do not need to follow up with anesthesia unless specifically instructed to do so.  WOUND CARE INSTRUCTIONS (if applicable):  Keep a dry clean dressing on the anesthesia/puncture wound site if there is drainage.  Once the wound has quit draining you may leave it open to air.  Generally you should leave the bandage intact for twenty four hours unless there is drainage.  If the epidural site drains for more than 36-48 hours please call the anesthesia department.  QUESTIONS?:  Please feel free to call your physician or the hospital operator if you have any questions, and they will be happy to assist you.

## 2011-11-12 NOTE — Progress Notes (Signed)
Results for [name], [name] (MRN [id number]) as of 11/12/2011 11:29  Ref. Range 11/12/2011 10:28  Sodium Latest Range: 135-145 mEq/L 138  Potassium Latest Range: 3.5-5.1 mEq/L 4.5  Chloride Latest Range: 96-112 mEq/L 104  CO2 Latest Range: 19-32 mEq/L 26  BUN Latest Range: 6-23 mg/dL 18  Creat Latest Range: 0.50-1.10 mg/dL 4.09  Calcium Latest Range: 8.4-10.5 mg/dL 9.6  GFR calc non Af Amer Latest Range: >90 mL/min >90  GFR calc Af Amer Latest Range: >90 mL/min >90  Glucose Latest Range: 70-99 mg/dL 83  WBC Latest Range: 8.1-19.1 K/uL 19.2 (H)  RBC Latest Range: 3.87-5.11 MIL/uL 4.46  HGB Latest Range: 12.0-15.0 g/dL 47.8 (L)  HCT Latest Range: 36.0-46.0 % 34.3 (L)  MCV Latest Range: 78.0-100.0 fL 76.9 (L)  MCH Latest Range: 26.0-34.0 pg 24.7 (L)  MCHC Latest Range: 30.0-36.0 g/dL 29.5  RDW Latest Range: 11.5-15.5 % 16.4 (H)  Platelets Latest Range: 150-400 K/uL 390  Neutrophils Relative Latest Range: 43-77 % 76  Lymphocytes Relative Latest Range: 12-46 % 16  Monocytes Relative Latest Range: 3-12 % 5  Eosinophils Relative Latest Range: 0-5 % 2  Basophils Relative Latest Range: 0-1 % 0  Neutrophils Absolute Latest Range: 1.7-7.7 K/uL 14.6 (H)  Lymphocytes Absolute Latest Range: 0.7-4.0 K/uL 3.1  Monocytes Absolute Latest Range: 0.1-1.0 K/uL 1.0  Eosinophils Absolute Latest Range: 0.0-0.7 K/uL 0.4  Basophils Absolute Latest Range: 0.0-0.1 K/uL 0.1

## 2011-11-13 ENCOUNTER — Encounter (HOSPITAL_COMMUNITY): Payer: Self-pay | Admitting: Pharmacy Technician

## 2011-11-16 ENCOUNTER — Encounter (HOSPITAL_COMMUNITY)
Admission: RE | Disposition: A | Discharge status: Home / Self Care | Payer: Self-pay | Source: Ambulatory Visit | Attending: General Surgery

## 2011-11-16 ENCOUNTER — Encounter (HOSPITAL_COMMUNITY): Payer: Self-pay | Admitting: *Deleted

## 2011-11-16 ENCOUNTER — Ambulatory Visit (HOSPITAL_COMMUNITY)
Admission: RE | Admit: 2011-11-16 | Discharge: 2011-11-16 | Disposition: A | Discharge status: Home / Self Care | Payer: Medicaid Other | Source: Ambulatory Visit | Attending: General Surgery | Admitting: General Surgery

## 2011-11-16 ENCOUNTER — Ambulatory Visit (HOSPITAL_COMMUNITY): Payer: Medicaid Other | Admitting: Anesthesiology

## 2011-11-16 ENCOUNTER — Encounter (HOSPITAL_COMMUNITY): Payer: Self-pay | Admitting: Anesthesiology

## 2011-11-16 DIAGNOSIS — R2230 Localized swelling, mass and lump, unspecified upper limb: Secondary | ICD-10-CM

## 2011-11-16 DIAGNOSIS — D1779 Benign lipomatous neoplasm of other sites: Secondary | ICD-10-CM | POA: Insufficient documentation

## 2011-11-16 DIAGNOSIS — I10 Essential (primary) hypertension: Secondary | ICD-10-CM | POA: Insufficient documentation

## 2011-11-16 HISTORY — PX: LIPOMA EXCISION: SHX5283

## 2011-11-16 SURGERY — EXCISION LIPOMA
Anesthesia: General | Site: Shoulder | Laterality: Left | Wound class: Clean

## 2011-11-16 MED ORDER — MIDAZOLAM HCL 2 MG/2ML IJ SOLN
1.0000 mg | INTRAMUSCULAR | Status: DC | PRN
  Administered 2011-11-16: 2 mg via INTRAVENOUS

## 2011-11-16 MED ORDER — LACTATED RINGERS IV SOLN
INTRAVENOUS | Status: DC | PRN
  Administered 2011-11-16 (×2): via INTRAVENOUS

## 2011-11-16 MED ORDER — FENTANYL CITRATE 0.05 MG/ML IJ SOLN
INTRAMUSCULAR | Status: AC
  Administered 2011-11-16: 25 ug via INTRAVENOUS
  Filled 2011-11-16: qty 2

## 2011-11-16 MED ORDER — BACITRACIN-NEOMYCIN-POLYMYXIN 400-5-5000 EX OINT
TOPICAL_OINTMENT | CUTANEOUS | Status: DC | PRN
  Administered 2011-11-16: 1 via TOPICAL

## 2011-11-16 MED ORDER — CEFAZOLIN SODIUM 1-5 GM-% IV SOLN
INTRAVENOUS | Status: AC
  Filled 2011-11-16: qty 50

## 2011-11-16 MED ORDER — FENTANYL CITRATE 0.05 MG/ML IJ SOLN
25.0000 ug | INTRAMUSCULAR | Status: DC | PRN
  Administered 2011-11-16 (×2): 25 ug via INTRAVENOUS

## 2011-11-16 MED ORDER — LIDOCAINE HCL (PF) 1 % IJ SOLN
INTRAMUSCULAR | Status: AC
  Filled 2011-11-16: qty 5

## 2011-11-16 MED ORDER — MIDAZOLAM HCL 2 MG/2ML IJ SOLN
INTRAMUSCULAR | Status: AC
  Administered 2011-11-16: 2 mg via INTRAVENOUS
  Filled 2011-11-16: qty 2

## 2011-11-16 MED ORDER — LACTATED RINGERS IV SOLN: INTRAVENOUS | Status: DC

## 2011-11-16 MED ORDER — FENTANYL CITRATE 0.05 MG/ML IJ SOLN
INTRAMUSCULAR | Status: AC
  Filled 2011-11-16: qty 2

## 2011-11-16 MED ORDER — FENTANYL CITRATE 0.05 MG/ML IJ SOLN
INTRAMUSCULAR | Status: DC | PRN
  Administered 2011-11-16 (×8): 25 ug via INTRAVENOUS

## 2011-11-16 MED ORDER — CEFAZOLIN SODIUM 1-5 GM-% IV SOLN
INTRAVENOUS | Status: DC | PRN
  Administered 2011-11-16: 1 g via INTRAVENOUS

## 2011-11-16 MED ORDER — CEFAZOLIN SODIUM 1-5 GM-% IV SOLN: 1.0000 g | INTRAVENOUS | Status: DC

## 2011-11-16 MED ORDER — BACITRACIN-NEOMYCIN-POLYMYXIN 400-5-5000 EX OINT
TOPICAL_OINTMENT | CUTANEOUS | Status: AC
  Filled 2011-11-16: qty 1

## 2011-11-16 MED ORDER — 0.9 % SODIUM CHLORIDE (POUR BTL) OPTIME
TOPICAL | Status: DC | PRN
  Administered 2011-11-16: 1000 mL

## 2011-11-16 MED ORDER — LIDOCAINE HCL (CARDIAC) 10 MG/ML IV SOLN
INTRAVENOUS | Status: DC | PRN
  Administered 2011-11-16: 50 mg via INTRAVENOUS

## 2011-11-16 MED ORDER — ONDANSETRON HCL 4 MG/2ML IJ SOLN: 4.0000 mg | Freq: Once | INTRAMUSCULAR | Status: DC | PRN

## 2011-11-16 MED ORDER — STERILE WATER FOR IRRIGATION IR SOLN
Status: DC | PRN
  Administered 2011-11-16: 2000 mL

## 2011-11-16 MED ORDER — MUPIROCIN 2 % EX OINT
TOPICAL_OINTMENT | CUTANEOUS | Status: AC
  Administered 2011-11-16: 2 via NASAL
  Filled 2011-11-16: qty 22

## 2011-11-16 MED ORDER — PROPOFOL 10 MG/ML IV EMUL
INTRAVENOUS | Status: DC | PRN
  Administered 2011-11-16: 150 mg via INTRAVENOUS

## 2011-11-16 MED ORDER — PROPOFOL 10 MG/ML IV EMUL
INTRAVENOUS | Status: AC
  Filled 2011-11-16: qty 20

## 2011-11-16 SURGICAL SUPPLY — 74 items
APL SKNCLS STERI-STRIP NONHPOA (GAUZE/BANDAGES/DRESSINGS) ×1
APPLICATOR COTTON TIP 6IN STRL (MISCELLANEOUS) ×1 IMPLANT
APPLIER CLIP 11 MED OPEN (CLIP)
APPLIER CLIP 9.375 SM OPEN (CLIP)
APR CLP MED 11 20 MLT OPN (CLIP)
APR CLP SM 9.3 20 MLT OPN (CLIP)
ATTRACTOMAT 16X20 MAGNETIC DRP (DRAPES) ×1 IMPLANT
BAG HAMPER (MISCELLANEOUS) ×2 IMPLANT
BALL CTTN STRL GZE (GAUZE/BANDAGES/DRESSINGS)
BENZOIN TINCTURE PRP APPL 2/3 (GAUZE/BANDAGES/DRESSINGS) ×1 IMPLANT
BLADE SURG 15 STRL LF DISP TIS (BLADE) ×1 IMPLANT
BLADE SURG 15 STRL SS (BLADE) ×2
CLIP APPLIE 11 MED OPEN (CLIP) ×1 IMPLANT
CLIP APPLIE 9.375 SM OPEN (CLIP) ×1 IMPLANT
CLOTH BEACON ORANGE TIMEOUT ST (SAFETY) ×2 IMPLANT
COTTON BALL STERILE (GAUZE/BANDAGES/DRESSINGS)
COTTON BALL STERILE 2 PK (GAUZE/BANDAGES/DRESSINGS) ×1 IMPLANT
COVER SURGICAL LIGHT HANDLE (MISCELLANEOUS) ×4 IMPLANT
ELECT NDL TIP 2.8 STRL (NEEDLE) ×1 IMPLANT
ELECT NEEDLE TIP 2.8 STRL (NEEDLE) IMPLANT
ELECT REM PT RETURN 9FT ADLT (ELECTROSURGICAL) ×2
ELECTRODE REM PT RTRN 9FT ADLT (ELECTROSURGICAL) ×1 IMPLANT
EVACUATOR DRAINAGE 10X20 100CC (DRAIN) ×1 IMPLANT
EVACUATOR SILICONE 100CC (DRAIN)
FORMALIN 10 PREFIL 120ML (MISCELLANEOUS) ×2 IMPLANT
GAUZE PETROLATUM 1 X8 (GAUZE/BANDAGES/DRESSINGS) ×1 IMPLANT
GLOVE BIOGEL PI IND STRL 7.0 (GLOVE) IMPLANT
GLOVE BIOGEL PI IND STRL 7.5 (GLOVE) IMPLANT
GLOVE BIOGEL PI INDICATOR 7.0 (GLOVE) ×1
GLOVE BIOGEL PI INDICATOR 7.5 (GLOVE) ×1
GLOVE ECLIPSE 7.0 STRL STRAW (GLOVE) ×2 IMPLANT
GLOVE EXAM NITRILE MD LF STRL (GLOVE) ×1 IMPLANT
GLOVE SKINSENSE NS SZ7.0 (GLOVE) ×1
GLOVE SKINSENSE STRL SZ7.0 (GLOVE) ×1 IMPLANT
GOWN STRL REIN XL XLG (GOWN DISPOSABLE) ×5 IMPLANT
INST SET MINOR GENERAL (KITS) ×2 IMPLANT
KIT ROOM TURNOVER APOR (KITS) ×2 IMPLANT
MANIFOLD NEPTUNE II (INSTRUMENTS) ×2 IMPLANT
MARKER SKIN DUAL TIP RULER LAB (MISCELLANEOUS) ×2 IMPLANT
NDL HYPO 25X1 1.5 SAFETY (NEEDLE) IMPLANT
NEEDLE HYPO 25X1 1.5 SAFETY (NEEDLE) IMPLANT
PACK BASIC LIMB (CUSTOM PROCEDURE TRAY) IMPLANT
PACK MINOR (CUSTOM PROCEDURE TRAY) ×1 IMPLANT
PAD ABD 5X9 TENDERSORB (GAUZE/BANDAGES/DRESSINGS) ×1 IMPLANT
PAD ARMBOARD 7.5X6 YLW CONV (MISCELLANEOUS) ×2 IMPLANT
SET BASIN LINEN APH (SET/KITS/TRAYS/PACK) ×2 IMPLANT
SOL PREP PROV IODINE SCRUB 4OZ (MISCELLANEOUS) ×2 IMPLANT
SPONGE GAUZE 4X4 12PLY (GAUZE/BANDAGES/DRESSINGS) ×2 IMPLANT
SPONGE INTESTINAL PEANUT (DISPOSABLE) ×1 IMPLANT
SPONGE LAP 18X18 X RAY DECT (DISPOSABLE) ×2 IMPLANT
STAPLER VISISTAT 35W (STAPLE) ×1 IMPLANT
STRIP CLOSURE SKIN 1/2X4 (GAUZE/BANDAGES/DRESSINGS) ×2 IMPLANT
STRIP CLOSURE SKIN 1/4X3 (GAUZE/BANDAGES/DRESSINGS) ×1 IMPLANT
SUT ETHILON 3 0 FSL (SUTURE) ×1 IMPLANT
SUT ETHILON 4 0 PS 2 18 (SUTURE) ×1 IMPLANT
SUT ETHILON 5 0 P 3 18 (SUTURE)
SUT NYLON ETHILON 5-0 P-3 1X18 (SUTURE) ×1 IMPLANT
SUT SILK 2 0 (SUTURE) ×2
SUT SILK 2-0 18XBRD TIE 12 (SUTURE) ×1 IMPLANT
SUT SILK 3 0 (SUTURE)
SUT SILK 3-0 18XBRD TIE 12 (SUTURE) ×1 IMPLANT
SUT VIC AB 3-0 SH 27 (SUTURE) ×4
SUT VIC AB 3-0 SH 27X BRD (SUTURE) ×1 IMPLANT
SUT VIC AB 4-0 PS2 27 (SUTURE) ×2 IMPLANT
SUT VIC AB 4-0 SH 27 (SUTURE)
SUT VIC AB 4-0 SH 27XBRD (SUTURE) ×1 IMPLANT
SUT VIC AB 5-0 P-3 18X BRD (SUTURE) ×1 IMPLANT
SUT VIC AB 5-0 P3 18 (SUTURE)
SUT VICRYL AB 3 0 TIES (SUTURE) ×1 IMPLANT
SYR BULB IRRIGATION 50ML (SYRINGE) ×2 IMPLANT
SYR CONTROL 10ML LL (SYRINGE) ×2 IMPLANT
TAPE CLOTH SURG 4X10 WHT LF (GAUZE/BANDAGES/DRESSINGS) ×1 IMPLANT
TOWEL OR 17X26 4PK STRL BLUE (TOWEL DISPOSABLE) ×2 IMPLANT
WATER STERILE IRR 1000ML POUR (IV SOLUTION) ×4 IMPLANT

## 2011-11-16 NOTE — Progress Notes (Signed)
Post OP Check  Pt. Awake and alert; pain controlled.  Dressings dry and in tact. Filed Vitals:   11/16/11 0930  BP: 130/77  Pulse: 64  Temp:   Resp: 11  Temp 98.3  Discharge and F/U arranged.

## 2011-11-16 NOTE — Progress Notes (Signed)
50 yr. Old B. Female with large mass Left shoulder for excisional biopsy.  Procedure and risks explained and informed consent obtained.  Labs reviewed.  WBC 19-- no obvious infection site.  ? leucamoid reaction of some sort.  Site marked and all questions answered.  Filed Vitals:   11/16/11 0654  Temp: 98.4 F (36.9 C)  BP 145/74 HR 77/min  Resp. 20/min  O2 sat 99%  No clinical changes since H&P done.  H&P dict. # [id number].

## 2011-11-16 NOTE — Transfer of Care (Signed)
Immediate Anesthesia Transfer of Care Note  Patient: [name]  Procedure(s) Performed: Procedure(s) (LRB): EXCISION LIPOMA (Left)  Patient Location: PACU  Anesthesia Type: General  Level of Consciousness: awake, alert  and oriented  Airway & Oxygen Therapy: Patient Spontanous Breathing and Patient connected to face mask oxygen  Post-op Assessment: Report given to PACU RN and Post -op Vital signs reviewed and stable  Post vital signs: Reviewed and stable  Complications: No apparent anesthesia complications

## 2011-11-16 NOTE — Preoperative (Signed)
Beta Blockers   Reason not to administer Beta Blockers:Not Applicable 

## 2011-11-16 NOTE — Anesthesia Preprocedure Evaluation (Addendum)
Anesthesia Evaluation  Patient identified by MRN, date of birth, ID band Patient awake    Reviewed: Allergy & Precautions, H&P , NPO status , Patient's Chart, lab work & pertinent test results  Airway Mallampati: II      Dental  (+) Missing, Poor Dentition, Chipped and Dental Advisory Given   Pulmonary neg pulmonary ROS, Current Smoker,  breath sounds clear to auscultation        Cardiovascular hypertension, Rhythm:Regular Rate:Normal     Neuro/Psych    GI/Hepatic   Endo/Other    Renal/GU      Musculoskeletal   Abdominal   Peds  Hematology   Anesthesia Other Findings   Reproductive/Obstetrics                           Anesthesia Physical Anesthesia Plan  ASA: II  Anesthesia Plan: General   Post-op Pain Management:    Induction: Intravenous  Airway Management Planned: LMA  Additional Equipment:   Intra-op Plan:   Post-operative Plan: Extubation in OR  Informed Consent: I have reviewed the patients History and Physical, chart, labs and discussed the procedure including the risks, benefits and alternatives for the proposed anesthesia with the patient or authorized representative who has indicated his/her understanding and acceptance.     Plan Discussed with:   Anesthesia Plan Comments:         Anesthesia Quick Evaluation

## 2011-11-16 NOTE — Anesthesia Postprocedure Evaluation (Signed)
  Anesthesia Post-op Note  Patient: [name]  Procedure(s) Performed: Procedure(s) (LRB): EXCISION LIPOMA (Left)  Patient Location: PACU  Anesthesia Type: General  Level of Consciousness: awake, alert , oriented and patient cooperative  Airway and Oxygen Therapy: Patient Spontanous Breathing and Patient connected to face mask oxygen  Post-op Pain: mild  Post-op Assessment: Post-op Vital signs reviewed, Patient's Cardiovascular Status Stable, Respiratory Function Stable, Patent Airway and No signs of Nausea or vomiting  Post-op Vital Signs: Reviewed and stable  Complications: No apparent anesthesia complications

## 2011-11-16 NOTE — Op Note (Signed)
NAMETASHIMA, SCARPULLA            ACCOUNT NO.:  [id number]  MEDICAL RECORD NO.:  [id number]  LOCATION:  [facility]                          FACILITY:  [facility]  PHYSICIAN:  Barbaraann Barthel, M.D. 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  PROCEDURE:  Excision of neoplasm, left shoulder.  SURGEON:  Barbaraann Barthel, MD  SPECIMEN:  Lesion from left shoulder.  WOUND CLASSIFICATION:  Clean.  ESTIMATED BLOOD LOSS:  Less than 30 mL.  FLUID:  One liter of crystalloids.  DRAINS:  None.  NOTE:  This is a 51 year old black female who had a mass on her left shoulder that had continued to increase in size over the years.  She has had this for many years.  She does not know how many exactly.  She was referred by Dr. Renard Matter to Dr. Romeo Apple and then she was referred to me. An MRI showed that this was a large lesion.  I measured intraoperatively approximately 15 lengthwise, 12 cm in its largest dimensions.  This appeared to be a lipoma with multiple pseudopods subcutaneously and deep over the deltoid and supraclavicular area.  TECHNIQUE:  The patient was placed in the right lateral decubitus position and supported by inflatable beanbag.  She was prepped with Betadine solution and draped in the usual manner.  Incision was then carried out through skin and subcutaneous tissue down to the lipoma which was dissected free from the muscular fascia underlying this lesion.  We removed this mass in toto.  This was a tedious dissection, but without any problems.  No serious bleeding was encountered.  She had some subcutaneous ooze and basically lost less than 30 mL of blood. After this was removed, we irrigated with normal saline solution and dilute Betadine and then closed the skin subcuticularly with 4-0 Polysorb suture with half inch  Steri-Strips to further approximate the skin layer.  No drain was placed.  Prior to closure, all sponge, needle, and instrument counts were found to be correct. Estimated blood loss was as I said 30 mL.  There were no complications.     Barbaraann Barthel, M.D.     WB/MEDQ  D:  11/16/2011  T:  11/16/2011  Job:  [id number]  cc:   Angus G. Renard Matter, MD Fax: [phone]  Vickki Hearing, M.D. Fax: [phone]

## 2011-11-16 NOTE — Discharge Instructions (Addendum)
Keep dressings dry and follow up in office 10:00 AM 11/17/11.  Ice pack to left shoulder as needed.   General Anesthetic, Adult A doctor specialized in giving anesthesia (anesthesiologist) or a nurse specialized in giving anesthesia (nurse anesthetist) gives medicine that makes you sleep while a procedure is performed (general anesthetic). Once the general anesthetic has been administered, you will be in a sleeplike state in which you feel no pain. After having a general anestheticyou may feel:   Dizzy.   Weak.   Drowsy.   Confused.  These feelings are normal and can be expected to last for up to 24 hours after the procedure is completed.  LET YOUR CAREGIVER KNOW ABOUT:  Allergies you have.   Medications you are taking, including herbs, eye drops, over the counter medications, dietary supplements, and creams.   Previous problems you have had with anesthetics or numbing medicines.   Use of cigarettes, alcohol, or illicit drugs.   Possibility of pregnancy, if this applies.   History of bleeding or blood disorders, including blood clots and clotting disorders.   Previous surgeries you have had and types of anesthetics you have received.   Family medical history, especially anesthetic problems.   Other health problems.  BEFORE THE PROCEDURE  You may brush your teeth on the morning of surgery but you should have no solid food or non-clear liquids for a minimum of 8 hours prior to your procedure. Clear liquids (water, black coffee, and tea) are acceptable in small amounts until 2 hours prior to your procedure.   You may take your regular medications the morning of your procedure unless your caregiver indicates otherwise.  AFTER THE PROCEDURE  After surgery, you will be taken to the recovery area where a nurse will monitor your progress. You will be allowed to go home when you are awake, stable, taking fluids well, and without serious pain or complications.   For the first 24  hours following an anesthetic:   Have a responsible person with you.   Do not drive a car. If you are alone, do not take public transportation.   Do not engage in strenuous activity. You may usually resume normal activities the next day, or as advised by your caregiver.   Do not drink alcohol.   Do not take medicine that has not been prescribed by your caregiver.   Do not sign important papers or make important decisions as your judgement may be impaired.   You may resume a normal diet as directed.   Change bandages (dressings) as directed.   Only take over-the-counter or prescription medicines for pain, discomfort, or fever as directed by your caregiver.  If you have questions or problems that seem related to the anesthetic, call the hospital and ask for the anesthetist, anesthesiologist, or anesthesia department. SEEK IMMEDIATE MEDICAL CARE IF:   You develop a rash.   You have difficulty breathing.   You have chest pain.   You have allergic problems.   You have uncontrolled nausea.   You have uncontrolled vomiting.   You develop any serious bleeding, especially from the incision site.  Document Released: 11/24/2007 Document Revised: 08/06/2011 Document Reviewed: 12/18/2010 [facility] Patient Information 2012 [place], [place].

## 2011-11-16 NOTE — Anesthesia Procedure Notes (Signed)
Procedure Name: LMA Insertion Date/Time: 11/16/2011 7:54 AM Performed by: Carolyne Littles, Asiel Chrostowski L Pre-anesthesia Checklist: Patient identified, Timeout performed, Emergency Drugs available, Suction available and Patient being monitored Patient Re-evaluated:Patient Re-evaluated prior to inductionOxygen Delivery Method: Circle system utilized Preoxygenation: Pre-oxygenation with 100% oxygen Intubation Type: IV induction Ventilation: Mask ventilation without difficulty LMA: LMA inserted LMA Size: 4.0 Number of attempts: 1 Placement Confirmation: positive ETCO2 and breath sounds checked- equal and bilateral Tube secured with: Tape Dental Injury: Teeth and Oropharynx as per pre-operative assessment

## 2011-11-16 NOTE — Brief Op Note (Signed)
11/16/2011  9:23 AM  PATIENT:  [name]  51 y.o. female  PRE-OPERATIVE DIAGNOSIS:  Large Neoplasm Left Shoulder  POST-OPERATIVE DIAGNOSIS:  Large Neoplasm Left Shoulder  PROCEDURE:  Procedure(s) (LRB): EXCISION LIPOMA (Left)  SURGEON:  Surgeon(s) and Role:    * Marlane Hatcher, MD - Primary  PHYSICIAN ASSISTANT:   ASSISTANTS: none   ANESTHESIA:   general  EBL:  Total I/O In: 1000 [I.V.:1000] Out: 30 [Blood:30]  BLOOD ADMINISTERED:none  DRAINS: none   LOCAL MEDICATIONS USED:  NONE  SPECIMEN:  Source of Specimen:  Left shoulder  DISPOSITION OF SPECIMEN:  PATHOLOGY  COUNTS:  YES  TOURNIQUET:  * No tourniquets in log *  DICTATION: .Other Dictation: Dictation Number OR dict. # [id number]  PLAN OF CARE: Discharge to home after PACU  PATIENT DISPOSITION:  PACU - hemodynamically stable.   Delay start of Pharmacological VTE agent (>24hrs) due to surgical blood loss or risk of bleeding: not applicable

## 2011-11-17 ENCOUNTER — Encounter (HOSPITAL_COMMUNITY): Payer: Self-pay | Admitting: General Surgery

## 2011-11-19 NOTE — Addendum Note (Signed)
Addendum  created 11/19/11 1125 by Franco Nones, CRNA   Modules edited:Charges VN

## 2011-12-23 ENCOUNTER — Encounter (HOSPITAL_COMMUNITY)
Admission: RE | Admit: 2011-12-23 | Discharge: 2011-12-23 | Disposition: A | Discharge status: Home / Self Care | Payer: Medicaid Other | Source: Ambulatory Visit | Attending: Oral Surgery | Admitting: Oral Surgery

## 2011-12-23 ENCOUNTER — Encounter (HOSPITAL_COMMUNITY): Payer: Self-pay

## 2011-12-23 LAB — CBC
Hemoglobin: 11 g/dL — ABNORMAL LOW (ref 12.0–15.0)
MCV: 77.1 fL — ABNORMAL LOW (ref 78.0–100.0)
Platelets: 377 10*3/uL (ref 150–400)
RBC: 4.36 MIL/uL (ref 3.87–5.11)
WBC: 9.6 10*3/uL (ref 4.0–10.5)

## 2011-12-23 NOTE — Pre-Procedure Instructions (Signed)
20 MYNDI WAMBLE  12/23/2011   Your procedure is scheduled on:  12/28/2011  Report to [facility] at 0830  AM.  Call this number if you have problems the morning of surgery: [phone]   Remember:   Do not eat food:After Midnight.  May have clear liquids: up to 4 Hours before arrival.0430.09/09/1960 NOT DRINK ANY LIQUIDS AFTER 0430 AM THE DAY OF SURGERY.   Clear liquids include soda, tea, black coffee, apple or grape juice, broth.  Take these medicines the morning of surgery with A SIP OF WATER: NONE   Do not wear jewelry, make-up or nail polish.  Do not wear lotions, powders, or perfumes. You may wear deodorant.  Do not shave 48 hours prior to surgery.  Do not bring valuables to the hospital.  Contacts, dentures or bridgework may not be worn into surgery.  Leave suitcase in the car. After surgery it may be brought to your room.  For patients admitted to the hospital, checkout time is 11:00 AM the day of discharge.   Patients discharged the day of surgery will not be allowed to drive home.  Name and phone number of your driver: [employer] [facility]AN    Special Instructions: CHG Shower Use Special Wash: 1/2 bottle night before surgery and 1/2 bottle morning of surgery.   Please read over the following fact sheets that you were given: Pain Booklet, Coughing and Deep Breathing, MRSA Information and Surgical Site Infection Prevention

## 2011-12-28 ENCOUNTER — Encounter (HOSPITAL_COMMUNITY): Payer: Self-pay | Admitting: Certified Registered Nurse Anesthetist

## 2011-12-28 ENCOUNTER — Encounter (HOSPITAL_COMMUNITY): Payer: Self-pay | Admitting: *Deleted

## 2011-12-28 ENCOUNTER — Ambulatory Visit (HOSPITAL_COMMUNITY): Payer: Medicaid Other | Admitting: Anesthesiology

## 2011-12-28 ENCOUNTER — Encounter (HOSPITAL_COMMUNITY): Payer: Self-pay | Admitting: Anesthesiology

## 2011-12-28 ENCOUNTER — Ambulatory Visit (HOSPITAL_COMMUNITY)
Admission: RE | Admit: 2011-12-28 | Discharge: 2011-12-28 | Disposition: A | Discharge status: Home / Self Care | Payer: Medicaid Other | Source: Ambulatory Visit | Attending: Oral Surgery | Admitting: Oral Surgery

## 2011-12-28 ENCOUNTER — Encounter (HOSPITAL_COMMUNITY)
Admission: RE | Disposition: A | Discharge status: Home / Self Care | Payer: Self-pay | Source: Ambulatory Visit | Attending: Oral Surgery

## 2011-12-28 DIAGNOSIS — R2230 Localized swelling, mass and lump, unspecified upper limb: Secondary | ICD-10-CM

## 2011-12-28 DIAGNOSIS — Z01812 Encounter for preprocedural laboratory examination: Secondary | ICD-10-CM | POA: Insufficient documentation

## 2011-12-28 DIAGNOSIS — K029 Dental caries, unspecified: Secondary | ICD-10-CM | POA: Insufficient documentation

## 2011-12-28 HISTORY — PX: MULTIPLE EXTRACTIONS WITH ALVEOLOPLASTY: SHX5342

## 2011-12-28 SURGERY — MULTIPLE EXTRACTION WITH ALVEOLOPLASTY
Anesthesia: General | Site: Mouth | Laterality: Bilateral | Wound class: Clean Contaminated

## 2011-12-28 MED ORDER — MIDAZOLAM HCL 2 MG/2ML IJ SOLN: 1.0000 mg | INTRAMUSCULAR | Status: DC | PRN

## 2011-12-28 MED ORDER — OXYMETAZOLINE HCL 0.05 % NA SOLN
NASAL | Status: DC | PRN
  Administered 2011-12-28: 1 via NASAL

## 2011-12-28 MED ORDER — PROPOFOL 10 MG/ML IV EMUL
INTRAVENOUS | Status: DC | PRN
  Administered 2011-12-28: 200 mg via INTRAVENOUS
  Administered 2011-12-28: 100 mg via INTRAVENOUS

## 2011-12-28 MED ORDER — MIDAZOLAM HCL 5 MG/5ML IJ SOLN
INTRAMUSCULAR | Status: DC | PRN
  Administered 2011-12-28: 2 mg via INTRAVENOUS

## 2011-12-28 MED ORDER — FENTANYL CITRATE 0.05 MG/ML IJ SOLN: 50.0000 ug | INTRAMUSCULAR | Status: DC | PRN

## 2011-12-28 MED ORDER — LORAZEPAM 2 MG/ML IJ SOLN: 1.0000 mg | Freq: Once | INTRAMUSCULAR | Status: DC | PRN

## 2011-12-28 MED ORDER — HYDROMORPHONE HCL PF 1 MG/ML IJ SOLN: 0.2500 mg | INTRAMUSCULAR | Status: DC | PRN

## 2011-12-28 MED ORDER — SUCCINYLCHOLINE CHLORIDE 20 MG/ML IJ SOLN
INTRAMUSCULAR | Status: DC | PRN
  Administered 2011-12-28: 100 mg via INTRAVENOUS

## 2011-12-28 MED ORDER — OXYCODONE-ACETAMINOPHEN 5-325 MG PO TABS: 1.0000 | ORAL_TABLET | ORAL | Status: AC | PRN

## 2011-12-28 MED ORDER — FENTANYL CITRATE 0.05 MG/ML IJ SOLN
INTRAMUSCULAR | Status: DC | PRN
  Administered 2011-12-28: 125 ug via INTRAVENOUS

## 2011-12-28 MED ORDER — ONDANSETRON HCL 4 MG/2ML IJ SOLN
INTRAMUSCULAR | Status: DC | PRN
  Administered 2011-12-28: 4 mg via INTRAVENOUS

## 2011-12-28 MED ORDER — SODIUM CHLORIDE 0.9 % IR SOLN
Status: DC | PRN
  Administered 2011-12-28: 1000 mL

## 2011-12-28 MED ORDER — CEFAZOLIN SODIUM 1-5 GM-% IV SOLN
INTRAVENOUS | Status: DC | PRN
  Administered 2011-12-28: 1 g via INTRAVENOUS

## 2011-12-28 MED ORDER — CEFAZOLIN SODIUM 1-5 GM-% IV SOLN
INTRAVENOUS | Status: AC
  Filled 2011-12-28: qty 50

## 2011-12-28 MED ORDER — PHENYLEPHRINE HCL 10 MG/ML IJ SOLN
INTRAMUSCULAR | Status: DC | PRN
  Administered 2011-12-28: 40 ug via INTRAVENOUS
  Administered 2011-12-28: 80 ug via INTRAVENOUS
  Administered 2011-12-28: 40 ug via INTRAVENOUS
  Administered 2011-12-28: 80 ug via INTRAVENOUS

## 2011-12-28 MED ORDER — LACTATED RINGERS IV SOLN
INTRAVENOUS | Status: DC | PRN
  Administered 2011-12-28: 11:00:00 via INTRAVENOUS

## 2011-12-28 MED ORDER — LIDOCAINE-EPINEPHRINE 2 %-1:100000 IJ SOLN
INTRAMUSCULAR | Status: DC | PRN
  Administered 2011-12-28: 17 mL

## 2011-12-28 SURGICAL SUPPLY — 31 items
BUR CROSS CUT FISSURE 1.6 (BURR) ×2 IMPLANT
BUR EGG ELITE 4.0 (BURR) ×2 IMPLANT
CANISTER SUCTION 2500CC (MISCELLANEOUS) ×2 IMPLANT
CATH URET WHISTLE 8FR 331008 (CATHETERS) ×1 IMPLANT
CLOTH BEACON ORANGE TIMEOUT ST (SAFETY) ×2 IMPLANT
COVER SURGICAL LIGHT HANDLE (MISCELLANEOUS) ×2 IMPLANT
CRADLE DONUT ADULT HEAD (MISCELLANEOUS) ×2 IMPLANT
DECANTER SPIKE VIAL GLASS SM (MISCELLANEOUS) ×2 IMPLANT
GAUZE PACKING FOLDED 2  STR (GAUZE/BANDAGES/DRESSINGS) ×1
GAUZE PACKING FOLDED 2 STR (GAUZE/BANDAGES/DRESSINGS) ×1 IMPLANT
GLOVE BIO SURGEON STRL SZ 6.5 (GLOVE) ×2 IMPLANT
GLOVE BIO SURGEON STRL SZ7 (GLOVE) ×2 IMPLANT
GLOVE BIO SURGEON STRL SZ7.5 (GLOVE) ×2 IMPLANT
GLOVE BIOGEL PI IND STRL 7.0 (GLOVE) ×1 IMPLANT
GLOVE BIOGEL PI INDICATOR 7.0 (GLOVE) ×3
GOWN STRL NON-REIN LRG LVL3 (GOWN DISPOSABLE) ×4 IMPLANT
GOWN STRL REIN XL XLG (GOWN DISPOSABLE) ×2 IMPLANT
KIT BASIN OR (CUSTOM PROCEDURE TRAY) ×2 IMPLANT
KIT ROOM TURNOVER OR (KITS) ×2 IMPLANT
NEEDLE 22X1 1/2 (OR ONLY) (NEEDLE) ×2 IMPLANT
NS IRRIG 1000ML POUR BTL (IV SOLUTION) ×2 IMPLANT
PAD ARMBOARD 7.5X6 YLW CONV (MISCELLANEOUS) ×3 IMPLANT
SUT CHROMIC 3 0 PS 2 (SUTURE) ×3 IMPLANT
SYR 50ML SLIP (SYRINGE) IMPLANT
SYR CONTROL 10ML LL (SYRINGE) ×1 IMPLANT
TOWEL OR 17X24 6PK STRL BLUE (TOWEL DISPOSABLE) ×1 IMPLANT
TOWEL OR 17X26 10 PK STRL BLUE (TOWEL DISPOSABLE) ×2 IMPLANT
TRAY ENT MC OR (CUSTOM PROCEDURE TRAY) ×2 IMPLANT
TUBING IRRIGATION (MISCELLANEOUS) ×1 IMPLANT
WATER STERILE IRR 1000ML POUR (IV SOLUTION) IMPLANT
YANKAUER SUCT BULB TIP NO VENT (SUCTIONS) ×2 IMPLANT

## 2011-12-28 NOTE — Preoperative (Signed)
Beta Blockers   Reason not to administer Beta Blockers:Not Applicable 

## 2011-12-28 NOTE — Transfer of Care (Signed)
Immediate Anesthesia Transfer of Care Note  Patient: [name]  Procedure(s) Performed: Procedure(s) (LRB): MULTIPLE EXTRACION WITH ALVEOLOPLASTY (Bilateral)  Patient Location: PACU  Anesthesia Type: General  Level of Consciousness: awake, alert , oriented and patient cooperative  Airway & Oxygen Therapy: Patient Spontanous Breathing and Patient connected to nasal cannula oxygen  Post-op Assessment: Report given to PACU RN, Post -op Vital signs reviewed and stable and Patient moving all extremities  Post vital signs: Reviewed and stable  Complications: No apparent anesthesia complications

## 2011-12-28 NOTE — H&P (Signed)
HISTORY AND PHYSICAL  [name] is a 51 y.o. female patient with CC: Painful teeth.  No diagnosis found.  Past Medical History  Diagnosis Date  . Eczema   . Muscle spasm of back   . Shortness of breath     with exertion     Current Facility-Administered Medications  Medication Dose Route Frequency Provider Last Rate Last Dose  . fentaNYL (SUBLIMAZE) injection 50-100 mcg  50-100 mcg Intravenous PRN Bedelia Person, MD      . midazolam (VERSED) injection 1-2 mg  1-2 mg Intravenous PRN Bedelia Person, MD       Allergies  Allergen Reactions  . Chocolate     hives   Active Problems:  * No active hospital problems. *   Vitals: Blood pressure 140/87, pulse 84, temperature 98.7 F (37.1 C), temperature source Oral, resp. rate 18, SpO2 98.00%. Lab results:No results found for this or any previous visit (from the past 24 hour(s)). Radiology Results: No results found. General appearance: alert, cooperative, appears stated age and no distress Head: Normocephalic, without obvious abnormality, atraumatic Eyes: conjunctivae/corneas clear. PERRL, EOM's intact. Fundi benign. Ears: normal TM's and external ear canals both ears Nose: Nares normal. Septum midline. Mucosa normal. No drainage or sinus tenderness. Throat: multiple severe dental caries. Neck: no adenopathy, no carotid bruit, no JVD, supple, symmetrical, trachea midline and thyroid not enlarged, symmetric, no tenderness/mass/nodules Resp: clear to auscultation bilaterally Cardio: regular rate and rhythm, S1, S2 normal, no murmur, click, rub or gallop GI: soft, non-tender; bowel sounds normal; no masses,  no organomegaly  Assessment: 51 YO BF Multiple non-restorable teeth #'s 1, 2, 3, 4, 7, 8, 9, 12, 13, 14, 15, 16, 17, 29, 30, 31.  Plan: Extractionnon-restorable teeth #'s 1, 2, 3, 4, 7, 8, 9, 12, 13, 14, 15, 16, 17, 29, 30, 31. Alveoloplasty.    Georgia Lopes 12/28/2011

## 2011-12-28 NOTE — Anesthesia Postprocedure Evaluation (Signed)
  Anesthesia Post-op Note  Patient: [name]  Procedure(s) Performed: Procedure(s) (LRB): MULTIPLE EXTRACION WITH ALVEOLOPLASTY (Bilateral)  Patient Location: PACU and Short Stay  Anesthesia Type: General  Level of Consciousness: awake, alert  and oriented  Airway and Oxygen Therapy: Patient Spontanous Breathing  Post-op Pain: none  Post-op Assessment: Post-op Vital signs reviewed, Patient's Cardiovascular Status Stable, Respiratory Function Stable, Patent Airway, No signs of Nausea or vomiting and Pain level controlled  Post-op Vital Signs: Reviewed and stable  Complications: No apparent anesthesia complications

## 2011-12-28 NOTE — Anesthesia Preprocedure Evaluation (Addendum)
Anesthesia Evaluation  Patient identified by MRN, date of birth, ID band Patient awake    Reviewed: Allergy & Precautions, H&P , NPO status , Patient's Chart, lab work & pertinent test results  Airway Mallampati: I TM Distance: >3 FB Neck ROM: Full    Dental   Pulmonary shortness of breath, Current Smoker,    Pulmonary exam normal       Cardiovascular     Neuro/Psych    GI/Hepatic   Endo/Other    Renal/GU      Musculoskeletal   Abdominal (+) + obese,   Peds  Hematology   Anesthesia Other Findings   Reproductive/Obstetrics                          Anesthesia Physical Anesthesia Plan  ASA: II  Anesthesia Plan: General   Post-op Pain Management:    Induction: Intravenous  Airway Management Planned: Oral ETT  Additional Equipment:   Intra-op Plan:   Post-operative Plan: Extubation in OR  Informed Consent: I have reviewed the patients History and Physical, chart, labs and discussed the procedure including the risks, benefits and alternatives for the proposed anesthesia with the patient or authorized representative who has indicated his/her understanding and acceptance.     Plan Discussed with: CRNA and Surgeon  Anesthesia Plan Comments:         Anesthesia Quick Evaluation

## 2011-12-28 NOTE — Op Note (Signed)
NAMEJAYDEE, INGMAN            ACCOUNT NO.:  [id number]  MEDICAL RECORD NO.:  [id number]  LOCATION:  [facility]                         FACILITY:  [facility]  PHYSICIAN:  Georgia Lopes, M.D.  DATE OF BIRTH:  02/26/1961  DATE OF PROCEDURE:  12/28/2011 DATE OF DISCHARGE:  12/28/2011                              OPERATIVE REPORT   PREOPERATIVE DIAGNOSIS:  Nonrestorable teeth numbers 1, 2, 3, 4, 7, 8, 9, 12, 13, 14, 15, 16, 17, 29, 31.  POSTOPERATIVE DIAGNOSIS:  Nonrestorable teeth numbers 1, 2, 3, 4, 7, 8, 9, 12, 13, 14, 15, 16, 17, 29, 31.  PROCEDURES: 1. Removal of nonrestorable teeth numbers 1, 2, 3, 4, 7, 8, 9, 12, 13,     14, 15, 16, 17, 29, 31. 2. Alveoplasty of right and left maxilla.  SURGEON:  Georgia Lopes, MD  ANESTHESIA:  General, Dr. Gypsy Balsam attending.  INDICATIONS FOR PROCEDURE:  [name] is a 51 year old female who was referred to me by her general dentist for multiple dental extractions. She had multiple teeth that were deemed to be nonrestorable because of dental caries, and she needed alveoplasty in the right and left maxilla. Because of the extensiveness of this procedure, it was recommended that the patient undergo the procedure with general anesthesia.  PROCEDURE:  The patient was taken to the operating room, placed on the table in supine position.  General anesthesia was administered intravenously, and nasal endotracheal tube was placed atraumatically and secured.  The eyes were protected.  The patient was then draped for the procedure.  Time-out was performed.  The posterior pharynx was suctioned with Yankauer suction and a throat pack was placed.  Then 2% lidocaine with 1:100,000 epinephrine was infiltrated in the inferior alveolar block on the right and left side and the buccal and palatal infiltration in the maxilla, a total of 17 mL was utilized.  A bite block was placed on the right side of the mouth and a 15 blade was used to make an incision around  tooth #17 in the left mandible and around teeth numbers 7, 8, 9, 12, 13, 14, 15, 16 in the left maxilla.  The periosteum was reflected from around these teeth using the periosteal elevator.  Bone was removed around tooth numbers 17, 16, 15, 14, 13, and 12 using the Stryker handpiece with a fissure bur under irrigation.  The teeth were then elevated.  Tooth #17 was removed with the lower universal forceps, then the upper teeth were removed with the upper number 150 forceps and rongeurs.  Teeth numbers 14, 13, and 12 all fractured during removal and additional bone was removed with the dental drill and then the teeth were elevated with a 301 elevator and removed with the hemostat.  The periosteum was further reflected in the left maxilla and an alveoplasty was performed with an egg-shaped bur and a bone file.  Then, the teeth numbers 7, 8, and 9 were elevated with a 301 elevator and removed from the mouth with the universal forceps.  All sockets were then irrigated and then closed with 3-0 chromic.  The bite block and sweetheart retractor were repositioned to the other side of the mouth.  A 15 blade  was used to make a full-thickness incision in the area of teeth numbers 31 and 29 and were connected together.  It appeared on x-ray that fragment of tooth #30 may be present.  The periosteum was reflected in this area and there was no tooth identified in the area of tooth #30. Bone was then removed from around these teeth with a dental drill and then the teeth were elevated and removed with the lower universal forceps.  The sockets were irrigated and then closed with 3-0 chromic. Teeth numbers 1 through 4 were removed using a 15 blade to make a palatal and buccal incision.  The periosteum was reflected, bone was removed from around these teeth.  The teeth were elevated with a 301 elevator and removed from the mouth with the upper universal 150 forceps and the rongeurs.  In removal of teeth  numbers 3 and 4, a portion of the buccal plate fractured, it was deemed not to have enough periosteal contact to be viable, so it was removed and morselized and then replaced back into the right maxillary area.  The periosteum releasing incisions were made with a 15 blade, and the area was sutured primarily after alveoplasty with 3-0 chromic.  Then, the oral cavity was inspected and found to have good contact closure hemostasis.  The oral cavity was irrigated, suctioned.  Throat pack was removed.  The patient was awakened and taken to the recovery room breathing spontaneously in good condition.  ESTIMATED BLOOD LOSS:  Minimum.  COMPLICATIONS:  None.  SPECIMENS:  None.     Georgia Lopes, M.D.     SMJ/MEDQ  D:  12/28/2011  T:  12/28/2011  Job:  [id number]

## 2011-12-28 NOTE — Op Note (Signed)
12/28/2011  12:32 PM  PATIENT:  [name]  51 y.o. female  PRE-OPERATIVE DIAGNOSIS:  Nonrestorable Teeth #'s 1, 2, 3, 4, 7, 8, 9, 12, 13, 14, 15, 16, 17, 29, 31  POST-OPERATIVE DIAGNOSIS:  SAME  PROCEDURE:  Procedure(s): MULTIPLE EXTRACIONNonrestorable Teeth #"s 1, 2, 3, 4, 7, 8, 9, 12, 13, 14, 15, 16, 17, 29, 31. WITH ALVEOLOPLASTY R and L mandible  SURGEON:  Surgeon(s): Georgia Lopes, DDS  ANESTHESIA:   local and general  EBL:  minimal  DRAINS: none   SPECIMEN:  No Specimen  COUNTS:  YES  PLAN OF CARE: Discharge to home after PACU  PATIENT DISPOSITION:  PACU - hemodynamically stable.   PROCEDURE DETAILS: Dictation # [id number]  Georgia Lopes, DMD 12/28/2011 12:32 PM

## 2011-12-28 NOTE — Discharge Instructions (Signed)
Instructions Following General Anesthetic, Adult A nurse specialized in giving anesthesia (anesthetist) or a doctor specialized in giving anesthesia (anesthesiologist) gave you a medicine that made you sleep while a procedure was performed. For as long as 24 hours following this procedure, you may feel:  Dizzy.   Weak.   Drowsy.  AFTER THE PROCEDURE After surgery, you will be taken to the recovery area where a nurse will monitor your progress. You will be allowed to go home when you are awake, stable, taking fluids well, and without complications. For the first 24 hours following an anesthetic:  Have a responsible person with you.   Do not drive a car. If you are alone, do not take public transportation.   Do not drink alcohol.   Do not take medicine that has not been prescribed by your caregiver.   Do not sign important papers or make important decisions.   You may resume normal diet and activities as directed.   Change bandages (dressings) as directed.   Only take over-the-counter or prescription medicines for pain, discomfort, or fever as directed by your caregiver.  If you have questions or problems that seem related to the anesthetic, call the hospital and ask for the anesthetist or anesthesiologist on call. SEEK IMMEDIATE MEDICAL CARE IF:   You develop a rash.   You have difficulty breathing.   You have chest pain.   You develop any allergic problems.  Document Released: 11/23/2000 Document Revised: 08/06/2011 Document Reviewed: 07/04/2007 [employer] Patient Information 2012 [employer] 

## 2011-12-29 ENCOUNTER — Encounter (HOSPITAL_COMMUNITY): Payer: Self-pay | Admitting: Oral Surgery

## 2012-02-16 ENCOUNTER — Encounter: Payer: Self-pay | Admitting: Orthopedic Surgery

## 2012-02-16 ENCOUNTER — Ambulatory Visit (INDEPENDENT_AMBULATORY_CARE_PROVIDER_SITE_OTHER): Payer: Medicaid Other | Admitting: Orthopedic Surgery

## 2012-02-16 ENCOUNTER — Ambulatory Visit (INDEPENDENT_AMBULATORY_CARE_PROVIDER_SITE_OTHER): Payer: Medicaid Other

## 2012-02-16 VITALS — BP 130/90 | Ht 62.0 in | Wt 202.0 lb

## 2012-02-16 DIAGNOSIS — M25569 Pain in unspecified knee: Secondary | ICD-10-CM

## 2012-02-16 MED ORDER — DICLOFENAC POTASSIUM 50 MG PO TABS: 50.0000 mg | ORAL_TABLET | Freq: Two times a day (BID) | ORAL | Status: DC

## 2012-02-16 NOTE — Patient Instructions (Addendum)
Knee Problems, Questions and Answers Knee problems are common in young people and adults. This document contains general information about several knee problems. It includes:  Descriptions of the different parts of the knee.   Diagram of the different parts of the knee.  Individual sections describe specific types of knee injuries and their:    Symptoms.   Diagnosis.   Treatment.  Information on how to prevent these problems is also provided. WHAT DO THE KNEES DO? HOW DO THEY WORK? The knees provide stable support for the body. Knees allow the legs to bend and straighten. Flexibility and stability are needed for standing and for motions like:  Walking.   Jumping.   Running.   Turning.   Crouching.  Supporting and moving parts help the knees do their job, these parts include:  Bones.   Cartilage.   Muscles.   Ligaments.   Tendons.  Any of these parts can be involved in knee pain or a knee not working right (dysfunction). WHAT CAUSES KNEE PROBLEMS? There are two general kinds of knee problems: mechanical and inflammatory. Mechanical Knee Problems are problems that result from:  Injury, such as a direct blow or sudden movements that strain the knee beyond its normal range of movement.   Overuse, repetitive motions that produce partial fiber failure in tendon or ligaments.   Osteoarthritis in the knee, result from wear and tear on its parts.  Inflammatory Knee Problems are inflammation that occurs in certain rheumatic diseases, such as:    Rheumatoid arthritis.   Systemic lupus erythematosus.  JOINT BASICS  The point at which two or more bones are connected is called a joint.   In all joints, the bones are kept from grinding against each other by the tissue lining the ends of the bones called cartilage.   Bones are joined to bones by strong, elastic bands of tissue called ligaments.   Tendons are tough cords of tissue that connect muscle to bone.   Muscles  work in opposing pairs to bend and straighten joints. While muscles are not technically part of a joint, they are important because strong muscles help support and protect joints.  WHAT ARE THE PARTS OF THE KNEE? Like any joint, the knee is composed of bones and cartilage, ligaments, tendons, and muscles. BONES AND CARTILAGE The knee joint is the junction of four bones:    The thigh bone or upper leg bone (femur).   The shin bone or larger bone of the lower leg (tibia).   The small bone on the outside of the knee where ligaments attach (fibula).   The knee cap (patella). The patella is 2 to 3 inches wide and 3 to 4 inches long. It sits over the other bones at the front of the knee joint and slides when the leg moves. It protects the knee and gives leverage to muscles.  The ends of the bones in the knee joint are covered with articular cartilage, a tough, elastic material that helps absorb shock and allows the knee joint to move smoothly. Separating the bones of the knee are pads of connective tissue which are called meniscus. The plural is menisci. The menisci are divided into two crescent-shaped discs positioned between the tibia and femur on the outer and inner sides of each knee. The two menisci in each knee act as shock absorbers, cushioning the lower part of the leg from the weight of the rest of the body as well as enhancing stability. MUSCLES There are  two groups of muscles at the knee.  The quadriceps muscle are four muscles on the front of the thigh that work to straighten the leg from a bent position.   The hamstring muscles, which bend the leg at the knee, run along the back of the thigh from the hip to just below the knee.  Keeping these muscles strong with exercises such as walking up stairs or riding a stationary bicycle helps support and protect the knee. TENDONS AND LIGAMENTS  The quadriceps tendon connects the quadriceps muscle to the patella and provides the power to extend  the leg. The patella is a bone within this tendon. Four ligaments connect the femur and tibia and give the joint strength and stability:   The medial collateral ligament (MCL) provides stability to the inner (medial) part of the knee.   The lateral collateral ligament (LCL) provides stability to the outer (lateral) part of the knee.   The anterior cruciate ligament (ACL), in the center of the knee, limits rotation and the forward movement of the tibia.   The posterior cruciate ligament (PCL), also in the center of the knee, limits backward movement of the tibia.   Other ligaments are part of the knee capsule. This is the protective, fiber-like structure that wraps around the knee joint. Inside the capsule, the joint is lined with a thin, soft tissue called synovium. This tissue produces the fluid (synovial fluid) which lubricates the joint.  HOW ARE KNEE PROBLEMS DIAGNOSED? Caregivers use several methods to diagnose knee problems:  Medical history--The patient tells the caregiver details about:   Symptoms.   Injuries.   Medical conditions.   Physical examination-- To help the caregiver understand how the knee is working, the patient may be asked to stand, walk or squat. The caregiver, to discover the limits of movement and the location of pain in the knee, may:   Bend the knee.   Straighten the knee.   Rotate (turn) turn the knee.   Press on the knee to feel for injury.   Diagnostic tests--The caregiver uses one or more stress tests to determine the nature of a knee problem.   X-ray (radiography)--An x-ray beam is passed through the knee to produce a two-dimensional picture of the bones.   Computerized axial tomography (CAT) scan--X-rays are passed through the knee at different angles, detected by a scanner, and analyzed by a computer. This produces a series of clear cross-sectional images ("slices") of the knee tissues on a computer screen. CAT scan images show details of bone  structure, show soft tissues such as ligaments or muscles to a limited degree, can give a three-dimensional view of the knee.   Bone scan (radionuclide scanning)--A very small amount of radioactive material is injected into the patient's bloodstream and detected by a scanner. This test detects blood flow to the bone and cell activity within the bone and can show abnormalities. This may help the caregiver understand what is wrong.   Magnetic resonance imaging (MRI)--Energy from a powerful magnet (rather than x-rays) stimulates knee tissue to produce signals. These signals are detected by a scanner and analyzed by a computer. Like a CAT scan, a computer is used to produce three-dimensional views of the knee during MRI. The MRI provides precise details of ligament, tendon and cartilage structure.   Arthroscopy--The surgeon manipulates a small, lighted optic tube (arthroscope) that has been inserted into the joint through a small incision in the knee. Images of the inside of the knee joint are  projected onto a television screen. While the arthroscope is inside the knee joint, removal of loose pieces of bone or cartilage, or the repair of torn ligaments and menisci can be preformed.   Biopsy--The caregiver removes tissue to examine under a microscope.   Aspiration of fluid from the knee--The laboratory will analyze the fluid for cell count, presence of crystals that produce inflammation (as in gout where Uric Acid crystals are the cause of the inflammation) and check for infection.  WHAT IS ARTHRITIS OF THE KNEE?    Arthritis of the knee is most often osteoarthritis. In this disease, the cartilage in the joint gradually wears away. It may be caused by excess stress on the joint from:   Trauma.   Deformity.   Repeated injury.   Excess weight.   It most often affects middle-aged and older people. A young person who develops osteoarthritis may have an inherited form of the disease or may have  experienced continuous irritation from an unrepaired knee injury or other injury.   In rheumatoid arthritis, which can also affect the knees, the joint becomes inflamed and cartilage may be destroyed. Rheumatoid arthritis often affects people at an earlier age than osteoarthritis and often involves multiple joints.   Arthritis can also affect supporting structures such as muscles, tendons, and ligaments.  WHAT ARE SIGNS OF ARTHRITIS OF THE KNEE?  Someone who has arthritis of the knee may experience:   Pain.   Swelling/ fluid on the knee.   A decrease in knee motion.   A common symptom is morning stiffness. This generally improves as the person moves around.   Sometimes the joint locks or clicks. These signs may occur in other knee disorders as well.   The caregiver may confirm the diagnosis by:   Performing a physical examination.   Examining x-rays, which typically show a loss of joint space.   Blood tests may be helpful for diagnosing rheumatoid arthritis, but other tests may be needed.   Analyzing fluid from the knee joint may be helpful in diagnosing some kinds of arthritis.   The caregiver may use arthroscopy to directly see damage to cartilage, tendons, and ligaments and to confirm a diagnosis. Arthroscopy is usually done only if a repair procedure is to be performed.  WHAT IS TREATMENT FOR ARTHRITIS OF THE KNEE?  Most often osteoarthritis of the knee is treated with pain-reducing medicines, such as:   Nonsteroidal anti-inflammatory drugs (NSAID's)   Exercises to restore joint movement and strengthen the knee.   Losing excess weight can also help people with osteoarthritis.   Rheumatoid arthritis of the knee may require physical therapy and more powerful medicines. In people with severe arthritis of the knee, a seriously damaged joint may need to be replaced with an artificial one.  WHAT IS CHONDROMALACIA?  Chondromalacia refers to softening of the articular cartilage  of the knee cap. This disorder occurs most often in young adults. Instead of gliding smoothly across the lower end of the thigh bone, the knee cap rubs against it, thereby roughening the cartilage underneath the knee cap. The damage may range from a slightly abnormal surface of the cartilage to a surface that has been worn away to the bone. It can be caused by:   Injury.   Overuse.   Misalignment of the patellar tendon.   Muscle weakness (generally the quadriceps).   Chondromalacia related to injury occurs when a blow to the knee cap tears off either a small piece of cartilage or  a large fragment containing a piece of bone.  WHAT ARE SYMPTOMS OF CHONDROMALACIA?  The most frequent symptom is a dull pain around or under the knee cap. This pain worsens when walking down stairs, or hills or sitting with the knee bent for long periods of time.   A person may also feel pain when climbing stairs or when the knee bears weight as it straightens.   The disorder is common in:   Runners.   Skiers.   Cyclists.   Soccer players.   A patient's description of symptoms, the physical exam, and a follow-up x-ray usually help the caregiver make a diagnosis.   Although arthroscopy can confirm the diagnosis. It is not used unless the condition requires extensive treatment.  WHAT IS TREATMENT FOR CHONDROMALACIA?  Many caregivers recommend that patients with chondromalacia perform low-impact exercises. The knee must not bend more than 90 degrees. This includes:   Swimming.   Riding a stationary bicycle.   Using a cross-country ski machine.   Electrical stimulation may also be used to strengthen the muscles.   If these treatments do not improve the condition, the caregiver may perform arthroscopic. Goals of this surgery include smoothing the surface of the cartilage and "washing out" the cartilage fragments that cause the joint to catch during bending and straightening.   In more severe cases,  surgery may be necessary to:   Correct the alignment of the knee cap.   Decrease the pressure on the undersurface of the patella.   Relieve friction with the cartilage.   Reposition parts that are out of alignment.  WHAT CAUSES INJURIES TO THE MENISCUS? The meniscus is easily injured by the force of rotating the knee while bearing weight. A partial or total tear may occur when a person quickly twists or rotates the upper leg while the foot stays still. For example, when dribbling a basketball around an opponent or turning to hit a tennis ball. If the tear is tiny, the meniscus stays connected to the front and back of the knee. If the tear is large, the meniscus may be left in an abnormally mobile position which produces instability. The seriousness of a tear depends on its location and extent. WHAT ARE SYMPTOMS OF INJURIES TO THE MENISCUS?  Pain, particularly when the knee is straightened.   If the pain is mild, the patient may continue with normal activity.   Severe pain may occur if a fragment of the meniscus catches between the femur and the tibia.   Swelling may occur soon after injury if blood vessels are disrupted. Swelling may occur several hours later if the joint fills with fluid produced by the joint lining (synovium) as a result of inflammation. If the synovium is injured, it may become inflamed and produce fluid. This makes the knee swell.   Sometimes, an injury that occurred in the past but was not treated becomes painful months or years later.   After any injury, the knee may click, lock, feel weak, or give way without warning.   Although symptoms of meniscal injury may disappear on their own (particularly with a stable meniscal tear), they frequently persist or return and require treatment.  HOW IS INJURY TO THE MENISCUS DIAGNOSED?  The caregiver will listen to the patient's description of the pain and swelling. The caregiver will perform a physical examination and take  x-rays of the knee. The examination may include a test in which the caregiver bends the leg, and then rotates the leg outward  and inward while extending it. Pain along the joint line or an audible click suggests a meniscal tear.   An MRI may be done.   Occasionally, the caregiver may use arthroscopy without obtaining the MRI to diagnose and treat a meniscal tear.  WHAT IS TREATMENT FOR INJURY TO THE MENISCUS?  The caregiver may recommend a muscle-strengthening program if:   The tear is minor.   The pain and symptoms are improving.   Exercises for meniscal problems are best started with guidance from a caregiver and physical therapist or athletic trainer. The therapist will make sure that the patient does the exercises properly and without risking new or repeat injury. The following exercises after injury to the meniscus are designed to build up the quadriceps and hamstring muscles and increase flexibility and strength.   Warming up the joint by riding a stationary bicycle, then straightening and raising the leg (but not straightening it too much).   Extending the leg while sitting (a weight may be worn on the ankle for this exercise).   Raising the leg while lying on the stomach.   Exercising in a pool (walking as fast as possible in chest-deep water, performing small flutter kicks while holding onto the side of the pool, and raising each leg to 90 in chest-deep water while pressing the back against the side of the pool).   If the tear is more extensive, the caregiver may perform arthroscopic with or without open surgery to see the extent of injury and to repair the tear. The caregiver can sew the meniscus back in place if the patient is relatively young, if the injury is in an area with a good blood supply, and if the ligaments are intact. Most young athletes are able to return to active sports after meniscus repair.   If the patient is elderly or the tear is in an area with a poor blood  supply, the caregiver may trim a small portion of the meniscus to even the surface. In rare cases, the caregiver removes the entire meniscus. Osteoarthritis is more likely to develop in the knee if the entire meniscus is removed.   Recovery after surgical repair takes several weeks to months. Activity after surgery is slightly more restricted than when the meniscus is partially removed. However, putting weight on the joint actually helps recovery. Regardless of the form of surgery, rehabilitation usually includes:   Walking.   Bending the legs.   Doing exercises that stretch and build up leg muscles.   The best results of treatment for meniscal injury are obtained in people who:   Do not have articular cartilage changes.   Have an intact ACL.  LIGAMENT INJURIES WHAT ARE THE CAUSES OF ANTERIOR AND POSTERIOR CRUCIATE LIGAMENT INJURIES?  Injury to the cruciate ligaments is sometimes referred to as a "sprain".   The ACL is most often stretched or torn (or both) by a sudden twisting or pushing the ACL beyond its normal range. For example, when the feet are planted one way and the knee rotates in the opposite direction.   The PCL is most often injured by a direct impact, such as in an automobile accident or football tackle.  WHAT ARE SYMPTOMS?  Injury to a cruciate ligament may not cause pain. Symptoms may include:   A popping sound   Buckling when trying to stand on the leg.   The caregiver will perform several physical exam tests. These tests are to see whether the parts of the knee  stay in proper position when pressure is applied in different directions.   A thorough examination is essential. An MRI is very accurate in detecting a complete tear. Arthroscopy may be the only reliable means of detecting a partial one.  TREATMENT  For an incomplete tear, the caregiver may recommend that the patient begin an exercise program to strengthen surrounding muscles.   The caregiver may also  prescribe a brace to protect the knee during activity.   For a completely torn ACL in an active athlete and motivated person, the caregiver is likely to recommend surgery. The surgeon may reconstruct the torn ligament by using:   A piece (graft) of healthy ligament from the patient (autograft)   A piece of ligament from a tissue bank (allograft). One of the most important elements in a patient's successful recovery after cruciate ligament surgery is a 4- to 44-month exercise program. This program may involve using special exercise equipment at a rehabilitation or sports center. Successful surgery special exercises will allow the patient to return to a normal, active lifestyle.  MEDIAL AND LATERAL COLLATERAL LIGAMENT INJURIES WHAT IS THE MOST COMMON CAUSE OF MEDIAL AND LATERAL COLLATERAL LIGAMENT INJURIES? The MCL is more commonly injured than the LCL. The cause is most often a blow to the outer side of the knee. This injury stretches and tears the ligament on the inner side of the knee. Such blows frequently occur in contact sports like football or hockey. SYMPTOMS AND DIAGNOSIS  When injury to the MCL occurs, a person may feel a pop and the knee may buckle sideways.   Pain and swelling are common.   A thorough exam is needed to determine the kind and extent of the injury.   To diagnose a collateral ligament injury, the caregiver exerts pressure on the side of the knee to determine the degree of pain and the looseness of the joint.   An MRI is helpful in diagnosing injuries to these ligaments.  TREATMENT  Most sprains of the collateral ligaments will heal if the patient follows a prescribed exercise program.   In addition to exercise, the caregiver may recommend ice packs to reduce pain and swelling and a small sleeve-type brace to protect and stabilize the knee.   A sprain may take 4 to 6 weeks to heal.   A patient with a severely sprained or torn collateral ligament may also have a torn  ACL. This usually requires surgical repair.  TENDON INJURIES AND DISORDERS WHAT CAUSES TENDINITIS AND RUPTURED TENDONS?  Knee tendon injuries range from tendinitis to a torn (ruptured) tendon.   If a person overuses a tendon during certain activities such as dancing, cycling, or running, the tendon stretches like a worn-out rubber band and becomes inflamed.   Also, trying to break a fall may cause the quadriceps muscles to contract and tear the quadriceps tendon above the patella or the patellar tendon below the patella. This type of injury is most likely to happen in older people.   Tendinitis of the patellar tendon is sometimes called jumper's knee because in sports that require jumping, such as basketball or volleyball, the muscle contraction and force of hitting the ground after a jump strain the tendon.   After repeated stress, the tendon may become inflamed or tear.  SYMPTOMS AND DIAGNOSIS  People with tendinitis often have tenderness at the point where the patellar tendon meets the bone. In addition, they may feel pain during running, fast walking, or jumping.   A complete  rupture of the quadriceps or patellar tendon is painful. It also makes it difficult for a person to bend, extend, or lift the leg against gravity.   If there is not much swelling, the caregiver may be able to feel a defect in the tendon near the tear during a physical examination.   An x-ray will show that the patella is lower than normal in a quadriceps tendon tear and higher than normal in a patellar tendon tear. The caregiver may use an MRI to confirm a partial or total tear.  TREATMENT  Initially, the caregiver may ask a patient with tendinitis to rest, elevate, and apply ice to the knee and to take medicines to relieve pain and decrease inflammation and swelling.   If the quadriceps or patellar tendon is completely ruptured, a surgeon will reattach the ends. After surgery, the patient will wear a cast or brace  for 3 to 6 weeks and use crutches.   For a partial tear, the caregiver might apply a cast or an extension knee brace without performing surgery.   Rehabilitating a partial or complete tear of a tendon requires an exercise program that is similar to but less forceful than that used for ligament injuries. The goals of exercise are to restore the ability to bend and straighten the knee and to strengthen the leg to prevent repeat injury. A rehabilitation program may last 4 to 6 months. A patient can return to many activities before then.  OSGOOD-SCHLATTER DISEASE WHAT CAUSES OSGOOD-SCHLATTER DISEASE?  Osgood-Schlatter disease is caused by repetitive stress or tension on part of the growth area of the upper tibia (the apophysis). Symptoms included inflammation of the patellar tendon and surrounding soft tissues at the point where the tendon attaches to the tibia.   The disease may also be associated with an injury in which the tendon is stretched so much that it tears away from the tibia and takes a fragment of bone with it.   The disease generally affects active young people. Particularly boys between the ages of 21 and 20, who play games or sports that include frequent running and jumping and who have open growth plates.  SYMPTOMS AND DIAGNOSIS  People with this disease experience pain just below the knee joint. This pain usually worsens with activity and is relieved by rest.   The bony bump that is particularly painful when pressed may increase in size at the upper edge of the tibia (below the knee cap).   Usually motion of the knee is not affected.   Pain may last a few months and may come back with periods of high activity until the child's growth is completed.   Osgood-Schlatter disease is most often diagnosed by the symptoms and the physical exam. An x-ray may be normal, or show an injury. An x-ray, more typically, will show that the growth area is fragmented.  TREATMENT  Usually, the  disease goes away without aggressive treatment.   Applying ice to the knee when pain begins helps relieve inflammation. Applying ice is sometimes used along with stretching and strengthening exercises.   The caregiver may advise the patient to limit participation in vigorous sports. Children who wish to continue less stressful sports activities may need to wear knee pads for protection and apply ice to the knee after activity. If there is a great deal of pain, sports activities may be limited until discomfort becomes tolerable.  ILIOTIBIAL BAND SYNDROME WHAT CAUSES ILIOTIBIAL BAND SYNDROME? This is an overuse condition in  which inflammation results when a band of a tendon rubs over the outer bone of the knee. Although iliotibial band syndrome may be caused by direct injury to the knee, it is most often caused by the stress of long-term overuse. SYMPTOMS AND DIAGNOSIS  A person with this syndrome feels an ache or burning sensation at the outside of the knee during activity. Pain may be localized at the outside of the knee or radiate up the side of the thigh.   A person may also feel a snap when the knee is bent and then straightened.   Swelling may be absent and knee motion is normal.   The diagnosis of this disorder is typically based on the symptoms. Symptoms include pain at the outer side of the knee. Other problems with similar symptoms must also be excluded.  TREATMENT  Usually, the problem disappears if the person reduces activity and performs stretching exercises followed by muscle-strengthening exercises.   In rare cases when the syndrome does not disappear, surgery may be necessary to split the tendon so it is not stretched too tightly over the bone.  OTHER KNEE INJURIES WHAT IS OSTEOCHONDRITIS DISSECANS?  Osteochondritis dissecans results from a loss of the blood supply to an area of bone at the joint surface and usually involves the knee. The affected bone and its covering of  cartilage gradually loosen and cause pain.   This problem usually arises in an active adolescent or young adult. It may be due to a slight blockage of a small artery or to an unrecognized injury or tiny fracture that damages the overlying cartilage.   Lack of a blood supply can cause bone to break down (avascular necrosis).   The involvement of several joints or the appearance of the condition in several family members may indicate that the disorder is inherited.   A person with this condition may eventually develop osteoarthritis.  SYMPTOMS AND DIAGNOSIS  If normal healing does not occur, cartilage separates from the diseased bone and a fragment breaks loose into the knee joint. This causes weakness, sharp pain, and locking of the joint.   An x-ray, MRI, or arthroscopy can determine the condition of the cartilage and can be used to diagnose osteochondritis dissecans.  TREATMENT  If cartilage fragments have not broken loose, a surgeon may fix the cartilage and underlying bone in place with pins or screws. These pins or screws are sunk into the cartilage to stimulate a new blood supply.   If fragments are loose, the surgeon may scrape down the cavity to reach fresh bone and add a bone graft and fix the fragments in position. Fragments that cannot be mended are removed, and the cavity is drilled or scraped to stimulate new cartilage growth.   Research is being done to assess the use of cartilage cell implants and other tissue transplants to treat this disorder.  WHAT IS PLICA SYNDROME?  Plica syndrome occurs when plicae (bands of synovial tissue) are irritated by overuse or injury.   Synovial plicae are the remains of tissue pouches found in the early stages of fetal development. As the fetus develops, these pouches normally combine to form one large synovial cavity. If this process is incomplete, plicae remain as folds or bands of synovial tissue within the knee.   Injury, chronic overuse,  or inflammatory conditions are associated with this syndrome.  SYMPTOMS AND DIAGNOSIS  People with this syndrome are likely to experience pain and swelling, a clicking sensation, and locking and weakness  of the knee.   Because the symptoms are similar to those of some other knee problems, plica syndrome is often misdiagnosed. Diagnosis usually depends on excluding other conditions that cause similar symptoms.  TREATMENT  The goal of treatment is to reduce inflammation of the synovium and thickening of the plicae.   The caregiver usually prescribes medicine to reduce inflammation.   The patient is also advised to reduce activity, apply ice and an elastic bandage to the knee, and do strengthening exercises.   A cortisone injection into the plica folds helps about half of those treated.   If treatment fails to relieve symptoms within 3 months, the caregiver may recommend arthroscopic or open surgery to remove the plicae.  WHAT KINDS OF CAREGIVERS TREAT KNEE PROBLEMS?  Extensive injuries and diseases of the knees are usually treated by an orthopedic surgeon, a doctor who has been trained in the nonsurgical and surgical treatment of bones, joints, and soft tissues such as ligaments, tendons, and muscles.   Patients seeking nonsurgical treatment of arthritis of the knee may also consult a rheumatologist. This is a caregiver specializing in the diagnosis and treatment of arthritis and related disorders.  HOW CAN PEOPLE PREVENT KNEE PROBLEMS? Some knee problems cannot be foreseen or prevented. However, a person can prevent many knee problems by following these suggestions:  Before exercising or playing sports, warm up by walking or riding a stationary bicycle, and then do stretches. Stretching the muscles in the front of the thigh (quadriceps) and back of the thigh (hamstrings) reduces tension on the tendons. Stretching also relieves pressure on the knee during activity.   Strengthen the leg  muscles by doing specific exercises (for example, by walking up stairs or hills, or by riding a stationary bicycle). A supervised workout with weights is another way to strengthen the leg muscles that support the knee.   Avoid sudden changes in the intensity of exercise. Increase the force or duration of activity gradually.   Wear shoes that both fit properly and are in good condition. Good shoes will help maintain balance and leg alignment when walking or running. Knee problems can be caused by flat feet or feet that roll inward. People can often reduce some of these problems by wearing special shoe inserts (orthotics).   Maintain a healthy weight to reduce stress on the knee. Obesity increases the risk of degenerative (wearing) conditions such as osteoarthritis of the knee.  WHAT TYPES OF EXERCISE ARE MOST SUITABLE FOR SOMEONE WITH KNEE PROBLEMS? Three types of exercise are best for people with arthritis:  Range-of-motion exercises help maintain normal joint movement and relieve stiffness. This type of exercise helps maintain or increase flexibility.   Strengthening exercises help maintain or increase muscle strength. Strong muscles help support and protect joints affected by arthritis.   Aerobic or endurance exercises improve function of the heart and circulation and help control weight. Weight control can be important to people who have arthritis because extra weight puts pressure on many joints. Some studies show that aerobic exercise can reduce inflammation in some joints.  WHERE CAN PEOPLE FIND MORE INFORMATION ABOUT KNEE PROBLEMS? [employer] of Arthritis and Musculoskeletal and Skin: www.niams.SelfMillionaire.nl American Academy of Orthopedic Surgeons: www.aaos.org [employer] of Rheumatology: www.rheumatology.org American Physical Therapy Association: www.apta.org Arthritis Foundation: www.arthritis.org Document Released: 08/20/2003 Document Revised: 08/06/2011 Document Reviewed:  12/05/2008 [facility] Patient Information 2012 [place], [place].  You have received a steroid shot. 15% of patients experience increased pain at the injection site with in the  next 24 hours. This is best treated with ice and tylenol extra strength 2 tabs every 8 hours. If you are still having pain please call the office.  New med sent to your pharmacy

## 2012-02-25 ENCOUNTER — Encounter: Payer: Self-pay | Admitting: Orthopedic Surgery

## 2012-02-25 NOTE — Progress Notes (Signed)
  Subjective:    Patient ID: [name], female    DOB: [birth year], 51 y.o.   MRN: [id number] Chief Complaint  Patient presents with  . Knee Pain    right knee pain and some swelling for several years, gradual onset, no known injury- patient does have a history of back pain and leg pain     Knee Pain    51 year old female reports lateral knee pain gradual onset started years ago partially relieved by ibuprofen as worse at night but per persist throughout the day she reports 10 out of 10 pain which is sharp burning with swelling but no mechanical symptoms  Review of systems rash itching and redness related to her skin she also reports easy bleeding and bruising and joint stiffness otherwise normal review of systems  No major medical problems     Review of Systems     Objective:   Physical Exam  Constitutional: Vital signs are normal. She appears well-developed and well-nourished.  Non-toxic appearance.  Neck: Neck supple.  Cardiovascular: Intact distal pulses.   Musculoskeletal:       Right knee: She exhibits normal range of motion, no swelling, no effusion, no ecchymosis, no deformity, no laceration, no erythema, normal alignment, no LCL laxity, normal patellar mobility, no bony tenderness, normal meniscus and no MCL laxity. tenderness found. No MCL, no LCL and no patellar tendon tenderness noted.       Left knee: She exhibits normal range of motion, no swelling, no effusion, no ecchymosis, no deformity, no laceration, no erythema, normal alignment, no LCL laxity, normal patellar mobility, no bony tenderness, normal meniscus and no MCL laxity. tenderness found. No MCL, no LCL and no patellar tendon tenderness noted.       Upper extremity exam  Inspection and palpation revealed no abnormalities in the upper extremities.  Range of motion is full without contracture.  Motor exam is normal with grade 5 strength.  The joints are fully reduced without subluxation.  There is  no atrophy or tremor and muscle tone is normal.  All joints are stable.       Neurological: She is alert.   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 Time out was completed.The joint was prepped with alcohol, followed by  Ethyl chloride spray and A 20 gauge needle was inserted into the knee via lateral approach; 4ml 1% lidocaine and 1 ml of depomedrol  was then injected into the joint . The needle was removed and the area cleansed and dressed.  Complications:  None; patient tolerated the procedure well.

## 2012-04-02 ENCOUNTER — Emergency Department (HOSPITAL_COMMUNITY): Payer: Medicaid Other

## 2012-04-02 ENCOUNTER — Emergency Department (HOSPITAL_COMMUNITY)
Admission: EM | Admit: 2012-04-02 | Discharge: 2012-04-02 | Disposition: A | Discharge status: Home / Self Care | Payer: Medicaid Other | Attending: Emergency Medicine | Admitting: Emergency Medicine

## 2012-04-02 ENCOUNTER — Encounter (HOSPITAL_COMMUNITY): Payer: Self-pay

## 2012-04-02 DIAGNOSIS — N898 Other specified noninflammatory disorders of vagina: Secondary | ICD-10-CM | POA: Insufficient documentation

## 2012-04-02 DIAGNOSIS — R509 Fever, unspecified: Secondary | ICD-10-CM | POA: Insufficient documentation

## 2012-04-02 DIAGNOSIS — R10819 Abdominal tenderness, unspecified site: Secondary | ICD-10-CM | POA: Insufficient documentation

## 2012-04-02 DIAGNOSIS — R319 Hematuria, unspecified: Secondary | ICD-10-CM | POA: Insufficient documentation

## 2012-04-02 DIAGNOSIS — R109 Unspecified abdominal pain: Secondary | ICD-10-CM

## 2012-04-02 LAB — CBC WITH DIFFERENTIAL/PLATELET
Basophils Absolute: 0.1 10*3/uL (ref 0.0–0.1)
Eosinophils Absolute: 0.1 10*3/uL (ref 0.0–0.7)
Eosinophils Relative: 1 % (ref 0–5)
Lymphocytes Relative: 30 % (ref 12–46)
MCH: 26.8 pg (ref 26.0–34.0)
MCV: 78.9 fL (ref 78.0–100.0)
Neutrophils Relative %: 61 % (ref 43–77)
Platelets: 306 10*3/uL (ref 150–400)
RDW: 17.2 % — ABNORMAL HIGH (ref 11.5–15.5)
WBC: 9.2 10*3/uL (ref 4.0–10.5)

## 2012-04-02 LAB — BASIC METABOLIC PANEL
Calcium: 10 mg/dL (ref 8.4–10.5)
GFR calc non Af Amer: >90 mL/min (ref >90)
Sodium: 135 mEq/L (ref 135–145)

## 2012-04-02 LAB — URINALYSIS, ROUTINE W REFLEX MICROSCOPIC
Leukocytes, UA: NEGATIVE
Nitrite: NEGATIVE
Protein, ur: NEGATIVE mg/dL
Urobilinogen, UA: 0.2 mg/dL (ref 0.0–1.0)

## 2012-04-02 LAB — PREGNANCY, URINE: Preg Test, Ur: NEGATIVE

## 2012-04-02 MED ORDER — OXYCODONE-ACETAMINOPHEN 5-325 MG PO TABS: 1.0000 | ORAL_TABLET | Freq: Four times a day (QID) | ORAL | Status: AC | PRN

## 2012-04-02 MED ORDER — SODIUM CHLORIDE 0.9 % IV BOLUS (SEPSIS)
500.0000 mL | Freq: Once | INTRAVENOUS | Status: AC
  Administered 2012-04-02: 500 mL via INTRAVENOUS

## 2012-04-02 MED ORDER — MORPHINE SULFATE 4 MG/ML IJ SOLN
4.0000 mg | Freq: Once | INTRAMUSCULAR | Status: AC
  Administered 2012-04-02: 4 mg via INTRAVENOUS
  Filled 2012-04-02: qty 1

## 2012-04-02 NOTE — ED Notes (Signed)
Discharge instructions reviewed with pt, questions answered. Pt verbalized understanding.  

## 2012-04-02 NOTE — ED Notes (Signed)
Pt reports right flank pain and lower left groin pain for the past few days.  Pt reports some hematuria.  Pt also reports some nausea.

## 2012-04-02 NOTE — ED Provider Notes (Signed)
History   This chart was scribed for [employer]. Rubin Payor, MD by Charolett Bumpers . The patient was seen in room APA10/APA10. Patient's care was started at 1518.    CSN: [id number]  Arrival date & time 04/02/12  1506   First MD Initiated Contact with Patient 04/02/12 1518      Chief Complaint  Patient presents with  . Flank Pain    (Consider location/radiation/quality/duration/timing/severity/associated sxs/prior treatment) HPI [name] is a 51 y.o. female who presents to the Emergency Department complaining of constant, moderate right-sided flank pain for the past few days. Pt reports associated lower left abdominal pain, vaginal bleeding, hematuria and nausea. Pt also reports an intermittent subjective fever and chills. Pt denies any blood in stool. Pt states that her pain started in her LLQ and then moved to her right flank. Pt denies any known injuries or recent falls. Pt reports that her symptoms are worsening. Pt denies any modifying factors. Pt states that she still has her menstrual cycle intermittently. Pt states that she has an appointment at [facility] Monday.    Past Medical History  Diagnosis Date  . Eczema   . Muscle spasm of back   . Shortness of breath     with exertion     Past Surgical History  Procedure Date  . No past surgeries   . Lipoma excision 11/16/2011    Procedure: EXCISION LIPOMA;  Surgeon: Marlane Hatcher, MD;  Location: [facility];  Service: General;  Laterality: Left;  Excision of neoplasm left shoulder  . Tubal ligation     and burned per patient.   . Multiple extractions with alveoloplasty 12/28/2011    Procedure: MULTIPLE EXTRACION WITH ALVEOLOPLASTY;  Surgeon: Georgia Lopes, DDS;  Location: [facility] OR;  Service: Oral Surgery;  Laterality: Bilateral;    Family History  Problem Relation Age of Onset  . Arthritis    . Anesthesia problems Neg Hx   . Hypotension Neg Hx   . Malignant hyperthermia Neg Hx   . Pseudochol deficiency Neg  Hx     History  Substance Use Topics  . Smoking status: Former Smoker -- 0.1 packs/day for 1 years    Types: Cigarettes  . Smokeless tobacco: Never Used  . Alcohol Use: No    OB History    Grav Para Term Preterm Abortions TAB SAB Ect Mult Living   6 6 6       6       Review of Systems  Constitutional: Positive for fever and chills. Negative for unexpected weight change.  Gastrointestinal: Positive for nausea and abdominal pain. Negative for vomiting and blood in stool.  Genitourinary: Positive for hematuria, flank pain and vaginal bleeding. Negative for dysuria.  Musculoskeletal: Negative for back pain.  Neurological: Negative for dizziness and light-headedness.  Hematological: Does not bruise/bleed easily.  All other systems reviewed and are negative.    Allergies  Chocolate  Home Medications   Current Outpatient Rx  Name Route Sig Dispense Refill  . CYCLOBENZAPRINE HCL 5 MG PO TABS Oral Take 5 mg by mouth 4 (four) times daily. For muscle spasms    . DICLOFENAC POTASSIUM 50 MG PO TABS Oral Take 1 tablet (50 mg total) by mouth 2 (two) times daily. 60 tablet 5  . MEGESTROL ACETATE 40 MG PO TABS Oral Take 40 mg by mouth daily.      BP 134/83  Pulse 129  Temp 98.3 F (36.8 C) (Oral)  Resp 18  Ht  5\' 2"  (1.575 m)  Wt 220 lb (99.791 kg)  BMI 40.24 kg/m2  SpO2 100%  Physical Exam  Nursing note and vitals reviewed. Constitutional: She is oriented to person, place, and time. She appears well-developed and well-nourished. No distress.  HENT:  Head: Normocephalic and atraumatic.  Eyes: Conjunctivae and EOM are normal.  Neck: Neck supple. No tracheal deviation present.  Cardiovascular: Normal rate, regular rhythm and normal heart sounds.   Pulmonary/Chest: Effort normal and breath sounds normal. No respiratory distress. She has no wheezes.  Abdominal: Soft. Bowel sounds are normal. She exhibits no distension and no mass. There is no tenderness. There is CVA tenderness.         Right-sided CVA tenderness.   Musculoskeletal: Normal range of motion. She exhibits no edema.  Neurological: She is alert and oriented to person, place, and time. No sensory deficit.  Skin: Skin is warm and dry.  Psychiatric: She has a normal mood and affect. Her behavior is normal.    ED Course  Procedures (including critical care time)  DIAGNOSTIC STUDIES: Oxygen Saturation is 100% on room air, normal by my interpretation.    COORDINATION OF CARE:  15:29-Discussed planned course of treatment with the patient, who is agreeable at this time.   15:45-Medication Orders: Morphine 4 mg/mL injection 4 mg-once; Sodium chloride 0.9% bolus 500 mL-once.   16:53-Recheck: Informed pt of lab results and discussed the need for a CT scan, pt is agreeable with this plan.   Results for orders placed during the hospital encounter of 04/02/12  CBC WITH DIFFERENTIAL      Component Value Range   WBC 9.2  4.0 - 10.5 K/uL   RBC 4.89  3.87 - 5.11 MIL/uL   Hemoglobin 13.1  12.0 - 15.0 g/dL   HCT 95.6  21.3 - 08.6 %   MCV 78.9  78.0 - 100.0 fL   MCH 26.8  26.0 - 34.0 pg   MCHC 33.9  30.0 - 36.0 g/dL   RDW 57.8 (*) 46.9 - 62.9 %   Platelets 306  150 - 400 K/uL   Neutrophils Relative 61  43 - 77 %   Neutro Abs 5.6  1.7 - 7.7 K/uL   Lymphocytes Relative 30  12 - 46 %   Lymphs Abs 2.8  0.7 - 4.0 K/uL   Monocytes Relative 7  3 - 12 %   Monocytes Absolute 0.6  0.1 - 1.0 K/uL   Eosinophils Relative 1  0 - 5 %   Eosinophils Absolute 0.1  0.0 - 0.7 K/uL   Basophils Relative 1  0 - 1 %   Basophils Absolute 0.1  0.0 - 0.1 K/uL  BASIC METABOLIC PANEL      Component Value Range   Sodium 135  135 - 145 mEq/L   Potassium 3.8  3.5 - 5.1 mEq/L   Chloride 101  96 - 112 mEq/L   CO2 22  19 - 32 mEq/L   Glucose, Bld 90  70 - 99 mg/dL   BUN 11  6 - 23 mg/dL   Creatinine, Ser 5.28  0.50 - 1.10 mg/dL   Calcium 41.3  8.4 - 24.4 mg/dL   GFR calc non Af Amer >90  >90 mL/min   GFR calc Af Amer >90  >90  mL/min  URINALYSIS, ROUTINE W REFLEX MICROSCOPIC      Component Value Range   Color, Urine YELLOW  YELLOW   APPearance CLEAR  CLEAR   Specific Gravity, Urine <1.005 (*)  1.005 - 1.030   pH 6.0  5.0 - 8.0   Glucose, UA NEGATIVE  NEGATIVE mg/dL   Hgb urine dipstick TRACE (*) NEGATIVE   Bilirubin Urine NEGATIVE  NEGATIVE   Ketones, ur NEGATIVE  NEGATIVE mg/dL   Protein, ur NEGATIVE  NEGATIVE mg/dL   Urobilinogen, UA 0.2  0.0 - 1.0 mg/dL   Nitrite NEGATIVE  NEGATIVE   Leukocytes, UA NEGATIVE  NEGATIVE  PREGNANCY, URINE      Component Value Range   Preg Test, Ur NEGATIVE  NEGATIVE  URINE MICROSCOPIC-ADD ON      Component Value Range   Squamous Epithelial / LPF RARE  RARE   RBC / HPF 0-2  <3 RBC/hpf   Bacteria, UA RARE  RARE   Ct Abdomen Pelvis Wo Contrast  04/02/2012  *RADIOLOGY REPORT*  Clinical Data: Right-sided flank pain.  CT ABDOMEN AND PELVIS WITHOUT CONTRAST  Technique:  Multidetector CT imaging of the abdomen and pelvis was performed following the standard protocol without intravenous contrast.  Comparison: No priors.  Findings:  Lung Bases: Unremarkable.  Abdomen/Pelvis:  No abnormal calcifications within the collecting system of either kidney, along the course of either ureter, or within the lumen of the urinary bladder.  No hydroureteronephrosis or perinephric stranding to suggest urinary tract obstruction. Notably, however, there are numerous phleboliths within the gonadal veins bilaterally, which may appear as though there are ureteral calculi on plain film examinations.  The unenhanced appearance of the liver, gallbladder, pancreas, spleen, bilateral adrenal glands and bilateral kidneys is unremarkable.  There is no ascites or pneumoperitoneum and no pathologic distension of bowel.  No definite pathologic lymphadenopathy identified within the abdomen or pelvis.  Normal appendix.  Small metallic foreign body appears to be within the fecal stream in the region of the splenic flexure of  the colon (images 32 - 33 of series 2), presumably something ingested by the patient.  The uterus appears mildly enlarged and slightly heterogeneous in appearance, likely indicative of underlying fibroids.  Urinary bladder is normal in appearance.  Musculoskeletal: There are no aggressive appearing lytic or blastic lesions noted in the visualized portions of the skeleton.  IMPRESSION: 1.  No acute findings in the abdomen or pelvis to account for the patient's symptoms.  Specifically, no abnormal urinary tract calculi or findings to suggest urinary tract obstruction. 2.  Normal appendix. 3.  Small metallic foreign body within the fecal stream in the splenic flexure of the colon, presumably something ingested by the patient.  A foreign body of this size would be expected to pass spontaneously. 4.  Probable fibroid uterus.  Original Report Authenticated By: Florencia Reasons, M.D.      No diagnosis found.    MDM  Patient presented with right flank pain. She states it began with left lower quadrant abdominal pain about a month ago. She states it now hurts in her right flank. She states she has had some vaginal bleeding which which is going to see someone for started on medicines for her. No fevers. She states she has had some blood in her urine but thinks it may have come from the vagina. She had CVA tenderness. Patient felt much better after some IV pain medicine. Lab work is overall reassuring but she did have some hemoglobin in the urine. CT scan was done and was negative for acute cause the pain. She does have fibroids her uterus. Patient be discharged home to followup as needed.  I personally performed the services described in  this documentation, which was scribed in my presence. The recorded information has been reviewed and considered.         Juliet Rude. Rubin Payor, MD 04/02/12 1728

## 2012-08-29 ENCOUNTER — Other Ambulatory Visit (HOSPITAL_COMMUNITY): Payer: Self-pay | Admitting: Family Medicine

## 2012-08-29 DIAGNOSIS — Z139 Encounter for screening, unspecified: Secondary | ICD-10-CM

## 2012-09-05 ENCOUNTER — Other Ambulatory Visit: Payer: Self-pay | Admitting: *Deleted

## 2012-09-05 DIAGNOSIS — M1990 Unspecified osteoarthritis, unspecified site: Secondary | ICD-10-CM

## 2012-09-05 MED ORDER — DICLOFENAC POTASSIUM 50 MG PO TABS: 50.0000 mg | ORAL_TABLET | Freq: Two times a day (BID) | ORAL | Status: DC

## 2012-09-26 ENCOUNTER — Ambulatory Visit (HOSPITAL_COMMUNITY)
Admission: RE | Admit: 2012-09-26 | Discharge: 2012-09-26 | Disposition: A | Discharge status: Home / Self Care | Payer: Medicaid Other | Source: Ambulatory Visit | Attending: Family Medicine | Admitting: Family Medicine

## 2012-09-26 DIAGNOSIS — Z139 Encounter for screening, unspecified: Secondary | ICD-10-CM

## 2012-09-26 DIAGNOSIS — Z1231 Encounter for screening mammogram for malignant neoplasm of breast: Secondary | ICD-10-CM | POA: Insufficient documentation

## 2012-11-24 ENCOUNTER — Other Ambulatory Visit: Payer: Self-pay | Admitting: Obstetrics & Gynecology

## 2012-12-01 ENCOUNTER — Other Ambulatory Visit: Payer: Self-pay | Admitting: Obstetrics & Gynecology

## 2013-01-20 ENCOUNTER — Encounter: Payer: Self-pay | Admitting: *Deleted

## 2013-01-24 ENCOUNTER — Ambulatory Visit: Payer: Medicaid Other | Admitting: Obstetrics & Gynecology

## 2013-01-24 ENCOUNTER — Ambulatory Visit (INDEPENDENT_AMBULATORY_CARE_PROVIDER_SITE_OTHER): Payer: Medicaid Other | Admitting: Obstetrics & Gynecology

## 2013-01-24 ENCOUNTER — Encounter: Payer: Self-pay | Admitting: Obstetrics & Gynecology

## 2013-01-24 ENCOUNTER — Encounter (HOSPITAL_COMMUNITY): Payer: Self-pay

## 2013-01-24 ENCOUNTER — Emergency Department (HOSPITAL_COMMUNITY)
Admission: EM | Admit: 2013-01-24 | Discharge: 2013-01-24 | Disposition: A | Discharge status: Home / Self Care | Payer: Medicaid Other | Attending: Emergency Medicine | Admitting: Emergency Medicine

## 2013-01-24 VITALS — BP 140/90 | Ht 62.0 in | Wt 227.5 lb

## 2013-01-24 DIAGNOSIS — W57XXXA Bitten or stung by nonvenomous insect and other nonvenomous arthropods, initial encounter: Secondary | ICD-10-CM | POA: Insufficient documentation

## 2013-01-24 DIAGNOSIS — N949 Unspecified condition associated with female genital organs and menstrual cycle: Secondary | ICD-10-CM

## 2013-01-24 DIAGNOSIS — D259 Leiomyoma of uterus, unspecified: Secondary | ICD-10-CM

## 2013-01-24 DIAGNOSIS — Z8709 Personal history of other diseases of the respiratory system: Secondary | ICD-10-CM | POA: Insufficient documentation

## 2013-01-24 DIAGNOSIS — Y9389 Activity, other specified: Secondary | ICD-10-CM | POA: Insufficient documentation

## 2013-01-24 DIAGNOSIS — S40269A Insect bite (nonvenomous) of unspecified shoulder, initial encounter: Secondary | ICD-10-CM | POA: Insufficient documentation

## 2013-01-24 DIAGNOSIS — Z79899 Other long term (current) drug therapy: Secondary | ICD-10-CM | POA: Insufficient documentation

## 2013-01-24 DIAGNOSIS — Y9289 Other specified places as the place of occurrence of the external cause: Secondary | ICD-10-CM | POA: Insufficient documentation

## 2013-01-24 DIAGNOSIS — Z872 Personal history of diseases of the skin and subcutaneous tissue: Secondary | ICD-10-CM | POA: Insufficient documentation

## 2013-01-24 DIAGNOSIS — Z8739 Personal history of other diseases of the musculoskeletal system and connective tissue: Secondary | ICD-10-CM | POA: Insufficient documentation

## 2013-01-24 DIAGNOSIS — Z87891 Personal history of nicotine dependence: Secondary | ICD-10-CM | POA: Insufficient documentation

## 2013-01-24 MED ORDER — MEGESTROL ACETATE 40 MG PO TABS: 40.0000 mg | ORAL_TABLET | Freq: Every day | ORAL | Status: DC

## 2013-01-24 MED ORDER — DOXYCYCLINE HYCLATE 100 MG PO CAPS: 100.0000 mg | ORAL_CAPSULE | Freq: Two times a day (BID) | ORAL | Status: DC

## 2013-01-24 MED ORDER — DIPHENHYDRAMINE HCL 25 MG PO CAPS
25.0000 mg | ORAL_CAPSULE | Freq: Once | ORAL | Status: AC
  Administered 2013-01-24: 25 mg via ORAL
  Filled 2013-01-24: qty 1

## 2013-01-24 MED ORDER — DOXYCYCLINE HYCLATE 100 MG PO TABS
100.0000 mg | ORAL_TABLET | Freq: Once | ORAL | Status: AC
  Administered 2013-01-24: 100 mg via ORAL
  Filled 2013-01-24: qty 1

## 2013-01-24 NOTE — ED Provider Notes (Signed)
History     CSN: [id number]  Arrival date & time 01/24/13  1156   First MD Initiated Contact with Patient 01/24/13 1210      Chief Complaint  Patient presents with  . Insect Bite    (Consider location/radiation/quality/duration/timing/severity/associated sxs/prior treatment) HPI Comments: [name] is a 52 y.o. female who presents to the Emergency Department complaining of insect bite.  States she was sitting outside when she felt some type of bite or sting to her left shoulder.  States she did not see what type of insect it was.  Noticed a red blistered area to her shoulder with a red streak extending to her underarm.  She also c/o mild pain and itching to the area.  She denies fever,chills, swelling or numbness to the arm.     The history is provided by the patient.    Past Medical History  Diagnosis Date  . Eczema   . Muscle spasm of back   . Shortness of breath     with exertion   . Arthritis     Past Surgical History  Procedure Laterality Date  . No past surgeries    . Lipoma excision  11/16/2011    Procedure: EXCISION LIPOMA;  Surgeon: Marlane Hatcher, MD;  Location: [facility];  Service: General;  Laterality: Left;  Excision of neoplasm left shoulder  . Tubal ligation      and burned per patient.   . Multiple extractions with alveoloplasty  12/28/2011    Procedure: MULTIPLE EXTRACION WITH ALVEOLOPLASTY;  Surgeon: Georgia Lopes, DDS;  Location: [facility];  Service: Oral Surgery;  Laterality: Bilateral;  . Mouth surgery    . Shoulder surgery      Family History  Problem Relation Age of Onset  . Arthritis    . Anesthesia problems Neg Hx   . Hypotension Neg Hx   . Malignant hyperthermia Neg Hx   . Pseudochol deficiency Neg Hx   . Diabetes Brother     History  Substance Use Topics  . Smoking status: Former Smoker -- 0.10 packs/day for 1 years    Types: Cigarettes  . Smokeless tobacco: Never Used  . Alcohol Use: No    OB History   Grav Para Term  Preterm Abortions TAB SAB Ect Mult Living   5 5 5      1 6       Review of Systems  Constitutional: Negative for fever, chills, activity change and appetite change.  HENT: Negative for sore throat, facial swelling, trouble swallowing, neck pain and neck stiffness.   Respiratory: Negative for chest tightness, shortness of breath and wheezing.   Skin: Positive for color change.       Insect bite   Neurological: Negative for dizziness, weakness, numbness and headaches.  All other systems reviewed and are negative.    Allergies  Chocolate  Home Medications   Current Outpatient Rx  Name  Route  Sig  Dispense  Refill  . cyclobenzaprine (FLEXERIL) 5 MG tablet   Oral   Take 10 mg by mouth 4 (four) times daily. For muscle spasms         . diclofenac (CATAFLAM) 50 MG tablet   Oral   Take 1 tablet (50 mg total) by mouth 2 (two) times daily.   60 tablet   5   . ibuprofen (ADVIL,MOTRIN) 600 MG tablet   Oral   Take 600 mg by mouth 2 (two) times daily.         Marland Kitchen  megestrol (MEGACE) 40 MG tablet   Oral   Take 1 tablet (40 mg total) by mouth daily.   30 tablet   11   . traMADol (ULTRAM) 50 MG tablet   Oral   Take 50 mg by mouth every 4 (four) hours as needed for pain.         Marland Kitchen travoprost, benzalkonium, (TRAVATAN) 0.004 % ophthalmic solution   Both Eyes   Place 1 drop into both eyes at bedtime.         Marland Kitchen zolpidem (AMBIEN) 10 MG tablet   Oral   Take 10 mg by mouth at bedtime as needed for sleep.           BP 148/94  Pulse 90  Temp(Src) 98.2 F (36.8 C) (Oral)  Resp 18  Ht 5\' 2"  (1.575 m)  Wt 210 lb (95.255 kg)  BMI 38.4 kg/m2  SpO2 100%  Physical Exam  Nursing note and vitals reviewed. Constitutional: She is oriented to person, place, and time. She appears well-developed and well-nourished. No distress.  HENT:  Head: Normocephalic and atraumatic.  Mouth/Throat: Oropharynx is clear and moist.  Neck: Normal range of motion. Neck supple.  Cardiovascular:  Normal rate, regular rhythm, normal heart sounds and intact distal pulses.   No murmur heard. Pulmonary/Chest: Effort normal and breath sounds normal. No respiratory distress.  Musculoskeletal: She exhibits no edema and no tenderness.  Lymphadenopathy:    She has no cervical adenopathy.  Neurological: She is alert and oriented to person, place, and time. She exhibits normal muscle tone. Coordination normal.  Skin: Rash noted. There is erythema.  Vesicular lesion to the left shoulder with moderate surrounding erythema and single red streak extending to the left axilla.    ED Course  Procedures (including critical care time)  Labs Reviewed - No data to display No results found.      MDM    Patient has a 5 x 7 cm area of erythema to the left shoulder with a single red streak radiating to the left axilla.  Leading edge of erythema was marked by me.  Will start  Doxy.  Pt agrees to return here tomorrow if the sx's are worsening.    Are plan discussed and patient appears stable for discharge     Jasia Hiltunen L. Kolt Mcwhirter, PA-C 01/28/13 2231

## 2013-01-24 NOTE — Progress Notes (Signed)
Patient ID: [name], female   DOB: [birth year], 52 y.o.   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

## 2013-01-24 NOTE — ED Notes (Signed)
Pt reports was at a cookout last night and was bit by a bug.  Pt says felt something bite or sting her but didn't see what kind of insect it was.  Pt has blistered area to left shoulder with red streak radiating under left arm.

## 2013-01-29 NOTE — ED Provider Notes (Signed)
Medical screening examination/treatment/procedure(s) were performed by non-physician practitioner and as supervising physician I was immediately available for consultation/collaboration.   Jguadalupe Opiela W. Belton Peplinski, MD 01/29/13 1558 

## 2013-02-15 ENCOUNTER — Ambulatory Visit: Payer: Medicaid Other | Admitting: Orthopedic Surgery

## 2013-02-21 ENCOUNTER — Other Ambulatory Visit: Payer: Self-pay | Admitting: *Deleted

## 2013-02-21 ENCOUNTER — Ambulatory Visit: Payer: Medicaid Other | Admitting: Orthopedic Surgery

## 2013-02-21 DIAGNOSIS — M1990 Unspecified osteoarthritis, unspecified site: Secondary | ICD-10-CM

## 2013-02-21 MED ORDER — DICLOFENAC POTASSIUM 50 MG PO TABS: 50.0000 mg | ORAL_TABLET | Freq: Two times a day (BID) | ORAL | Status: DC

## 2013-03-09 ENCOUNTER — Ambulatory Visit: Payer: Medicaid Other | Admitting: Orthopedic Surgery

## 2013-03-30 ENCOUNTER — Ambulatory Visit (INDEPENDENT_AMBULATORY_CARE_PROVIDER_SITE_OTHER): Payer: Medicaid Other

## 2013-03-30 ENCOUNTER — Ambulatory Visit (INDEPENDENT_AMBULATORY_CARE_PROVIDER_SITE_OTHER): Payer: Medicaid Other | Admitting: Orthopedic Surgery

## 2013-03-30 ENCOUNTER — Encounter: Payer: Self-pay | Admitting: Orthopedic Surgery

## 2013-03-30 ENCOUNTER — Other Ambulatory Visit: Payer: Self-pay | Admitting: *Deleted

## 2013-03-30 VITALS — BP 150/94 | Ht 62.0 in | Wt 225.0 lb

## 2013-03-30 DIAGNOSIS — M25561 Pain in right knee: Secondary | ICD-10-CM

## 2013-03-30 DIAGNOSIS — M25569 Pain in unspecified knee: Secondary | ICD-10-CM

## 2013-03-30 DIAGNOSIS — M1711 Unilateral primary osteoarthritis, right knee: Secondary | ICD-10-CM | POA: Insufficient documentation

## 2013-03-30 NOTE — Progress Notes (Signed)
Patient ID: Madolyn Frieze, female   DOB: Nov 11, 1960, 52 y.o.   MRN: [id number] Chief Complaint  Patient presents with  . Follow-up    Annual recheck right knee with Xray    The patient complains of continued pain in her right knee with constant pain unrelieved by oral anti-inflammatories or analgesics. She's currently using a cane. The activities of daily living are  becoming more difficult for her. She would like to proceed with total knee replacement. We discussed the risks and benefits of the procedure which include but are not limited to stiffness infection blood clot. She would still like to proceed with the procedure she understands 3 day hospitalization   her knee still has a fairly good range of motion at 115 she is in valgus her knee is stable strength is normal scans intact pulses are good no peripheral edema she is ambulating with a cane. She has a limp she is oriented x3 mood and affect are normal general appearance shows mild obesity  BP 150/94  Ht 5\' 2"  (1.575 m)  Wt 225 lb (102.059 kg)  BMI 41.14 kg/m2   Today's film shows 14 of valgus and severe osteoarthritis   Arthritis right knee   Right total knee   Call patient with details

## 2013-03-30 NOTE — Patient Instructions (Signed)
Rt tka will be scheduled   We will call with the details

## 2013-03-31 ENCOUNTER — Other Ambulatory Visit: Payer: Self-pay | Admitting: *Deleted

## 2013-03-31 ENCOUNTER — Encounter (HOSPITAL_COMMUNITY): Payer: Self-pay

## 2013-03-31 MED ORDER — BUPIVACAINE LIPOSOME 1.3 % IJ SUSP: 20.0000 mL | Freq: Once | INTRAMUSCULAR | Status: DC

## 2013-04-06 ENCOUNTER — Telehealth: Payer: Self-pay | Admitting: Orthopedic Surgery

## 2013-04-06 NOTE — Telephone Encounter (Signed)
Regarding in-patient surgery scheduled at [facility], CPT [phone], ICD-9 codes 715.96, 715.16 - Contacted insurer Medicaid's phone and on-line system, [place] [employer], and no prior authorization is required.

## 2013-04-12 ENCOUNTER — Encounter (HOSPITAL_COMMUNITY): Payer: Self-pay

## 2013-04-12 ENCOUNTER — Telehealth: Payer: Self-pay | Admitting: Orthopedic Surgery

## 2013-04-12 ENCOUNTER — Encounter (HOSPITAL_COMMUNITY)
Admission: RE | Admit: 2013-04-12 | Discharge: 2013-04-12 | Disposition: A | Discharge status: Home / Self Care | Payer: Medicaid Other | Source: Ambulatory Visit | Attending: Orthopedic Surgery | Admitting: Orthopedic Surgery

## 2013-04-12 DIAGNOSIS — Z01812 Encounter for preprocedural laboratory examination: Secondary | ICD-10-CM | POA: Insufficient documentation

## 2013-04-12 LAB — CBC
Hemoglobin: 13.4 g/dL (ref 12.0–15.0)
MCV: 83.4 fL (ref 78.0–100.0)
Platelets: 358 10*3/uL (ref 150–400)
RDW: 15.4 % (ref 11.5–15.5)

## 2013-04-12 LAB — BASIC METABOLIC PANEL
CO2: 27 mEq/L (ref 19–32)
Chloride: 103 mEq/L (ref 96–112)
Creatinine, Ser: 0.97 mg/dL (ref 0.50–1.10)
GFR calc Af Amer: 77 mL/min — ABNORMAL LOW (ref >90)
Potassium: 4.6 mEq/L (ref 3.5–5.1)
Sodium: 139 mEq/L (ref 135–145)

## 2013-04-12 MED ORDER — MUPIROCIN 2 % EX OINT
TOPICAL_OINTMENT | CUTANEOUS | Status: AC
  Filled 2013-04-12: qty 22

## 2013-04-12 MED ORDER — CHLORHEXIDINE GLUCONATE 4 % EX LIQD: 60.0000 mL | Freq: Once | CUTANEOUS | Status: DC

## 2013-04-12 NOTE — Patient Instructions (Addendum)
Knee Rehabilitation, Guidelines Following Surgery Results after knee surgery are often greatly improved when you follow the exercise, range of motion and muscle strengthening exercises prescribed by your doctor. Safety measures are also important to protect the knee from further injury. Any time any of these exercises cause you to have increased pain or swelling in your knee joint, decrease the amount until you are comfortable again and slowly increase them. If you have problems or questions, call your caregiver or physical therapist for advice. HOME CARE INSTRUCTIONS   Remove items at home which could result in a fall. This includes throw rugs or furniture in walking pathways.  Continue medications as instructed.  You may shower or take tub baths when your staples or stitches are removed or as instructed.  Walk using crutches or walker as instructed.  Put weight on your legs and walk as much as is comfortable.  You may resume a sexual relationship in one month or when given the OK by your doctor.  Return to work as instructed by your doctor.  Do not drive a car for 6 weeks or as instructed.  Wear elastic stockings until instructed not to.  Make sure you keep all of your appointments after your operation with all of your doctors and caregivers. RANGE OF MOTION AND STRENGTHENING EXERCISES Rehabilitation of the knee is important following a knee injury or an operation. After just a few days of immobilization, the muscles of the thigh which control the knee become weakened and shrink (atrophy). Knee exercises are designed to build up the tone and strength of the thigh muscles and to improve knee motion. Often times heat used for twenty to thirty minutes before working out will loosen up your tissues and help with improving the range of motion. These exercises can be done on a training (exercise) mat, on the floor, on a table or on a bed. Use what ever works the best and is most comfortable for  you Knee exercises include:  Leg Lifts - While your knee is still immobilized in a splint or cast, you can do straight leg raises. Lift the leg to 60 degrees, hold for 3 sec, and slowly lower the leg. Repeat 10-20 times 2-3 times daily. Perform this exercise against resistance later as your knee gets better.  Quad and Hamstring Sets - Tighten up the muscle on the front of the thigh (Quad) and hold for 5-10 sec. Repeat this 10-20 times hourly. Hamstring sets are done by pushing the foot backward against an object and holding for 5-10 sec. Repeat as with quad sets.  Resistance and Weight exercises - After your knee no longer needs to be immobilized, progressive motion, resistance, and weight lifting exercises should be performed. This is best done under the guidance of a physical therapist. You may safely start with wall squats; with your feet 10 inches from a wall, place your back flat against the wall and lower your trunk about 6 inches until you feel work in your thighs. Hold 20-40 seconds, then rise slowly. Do 3-6 repetitions 2-3 times daily.  Endurance Training - Bicycle and walking are helpful in restoring strength and endurance to the leg. A rehabilitation program following serious knee injuries can speed recovery and prevent re-injury in the future due to weakened muscles. Contact your doctor or a physical therapist for more information on knee rehabilitation. MAKE SURE YOU:   Understand these instructions.  Will watch your condition.  Will get help right away if you are not doing  well or get worse. Document Released: 08/17/2005 Document Revised: 11/09/2011 Document Reviewed: 02/04/2007 [facility] Patient Information 2014 [place], [place]. Total Knee Replacement Care After Refer to this sheet in the next few weeks. These instructions provide you with information on caring for yourself after your procedure. Your caregiver also may give you specific instructions. Your treatment has been planned  according to the most current medical practices, but problems sometimes occur. Call your caregiver if you have any problems or questions after your procedure. HOME CARE INSTRUCTIONS   See a physical therapist as directed by your caregiver.  Take over-the-counter or prescription medicines for pain, discomfort, or fever only as directed by your caregiver.  Avoid lifting or driving until you are instructed otherwise.  If you have been sent home with a continuous passive motion machine, use it as directed by your caregiver. SEEK MEDICAL CARE IF:  You have difficulty breathing.  Your wound is red, swollen, or has become increasingly painful.  You have pus draining from your wound.  You have a bad smell coming from your wound.  You have persistent bleeding from your wound.  Your wound breaks open after sutures (stitches) or staples have been removed. SEEK IMMEDIATE MEDICAL CARE IF:   You have a fever.  You have a rash.  You have pain or swelling in your calf or thigh.  You have shortness of breath or chest pain.  Your range of motion in your knee is decreasing rather than increasing. MAKE SURE YOU:   Understand these instructions.  Will watch your condition.  Will get help right away if you are not doing well or get worse. Document Released: 03/06/2005 Document Revised: 02/16/2012 Document Reviewed: 10/06/2011 [facility] Patient Information 2014 [place], [place]. Total Knee Replacement Total knee replacement is a procedure to replace your knee joint with an artificial knee joint (prosthetic knee joint). The purpose of this surgery is to reduce pain and improve your knee function. LET YOUR CAREGIVER KNOW ABOUT:   Any allergies you have.  Any medicines you are taking, including vitamins, herbs, eyedrops, over-the-counter medicines, and creams.  Any problems you have had with the use of anesthetics.  Family history of problems with the use of anesthetics.  Any blood  disorders you have, including bleeding problems or clotting problems.  Previous surgeries you have had. RISKS AND COMPLICATIONS  Generally, total knee replacement is a safe procedure. However, as with any surgical procedure, complications can occur. Possible complications associated with total knee replacement include:  Loss of range of motion of the knee or instability.  Loosening of the prosthesis.  Infection.  Persistent pain. BEFORE THE PROCEDURE   Your caregiver will instruct you when you need to stop eating and drinking.  Ask your caregiver if you need to change or stop any regular medicines. PROCEDURE  Just before the procedure you will receive medicine that will make you drowsy (sedative). This will be given through a tube that is inserted into one of your veins (intravenous [IV] tube). Then you will either receive medicine to block pain from the waist down through your legs (spinal block) or medicine to also receive medicine to make you fall asleep (general anesthetic). You may also receive medicine to block feeling in your leg (nerve block) to help ease pain after surgery. An incision will be made in your knee. Your surgeon will take out any damaged cartilage and bone by sawing off the damaged surfaces. Then the surgeon will put a new metal liner over the sawed off  portion of your thigh bone (femur) and a plastic liner over the sawed off portion of one of the bones of your lower leg (tibia). This is to restore alignment and function to your knee. A plastic piece is often used to restore the surface of your knee cap. AFTER THE PROCEDURE  You will be taken to the recovery area. You may have drainage tubes to drain excess fluid from your knee. These tubes attach to a device that removes these fluids. Once you are awake, stable, and taking fluids well, you will be taken to your hospital room. You will receive physical therapy as prescribed by your caregiver. The length of your stay in the  hospital after a knee replacement is 2 4 days. Your surgeon may recommend that you spend time (usually an additional 10 14 days) in an extended-care facility to help you begin walking again and improve your range of motion before you go home. You may also be prescribed blood-thinning medicine to decrease your risk of developing blood clots in your leg. Document Released: 11/23/2000 Document Revised: 02/16/2012 Document Reviewed: 09/27/2011 [facility] Patient Information 2014 [place], [place]. Preparing for Knee Replacement Recovery from knee replacement surgery can be made easier and more comfortable by taking steps to be prepared before surgery. This includes:  Arranging for others to help you.  Preparing your home.  Making sure your body is prepared by doing a pre-operative exam and being as healthy as you can.  Doing exercises before your surgery as told by your caregiver. Also, you can ease any concerns about your financial responsibilities by calling your insurance company after you decide to have surgery.In addition to your surgery and hospital stay, you will want to ask about your coverage for medical equipment, rehab facilities, and home care. ARRANGING FOR HELP  You will be getting stronger and more mobile every day. However, in the first couple weeks after surgery, it is unlikely you will be able to do all your daily activities as easily as before your surgery.You will tire easily and still have limited movement in your leg.Follow these guidelines to best arrange for the help you may need after your surgery:  Arrange for someone to drive you home from the hospital.Your surgeon will be able to tell you how many days you can expect to be in the hospital.  Cancel all work, care-giving, and volunteer responsibilities for at least 4 to 6 weeks after your surgery.  If you live alone, arrange for someone to care for your home and pets for the first 4 to 6 weeks.  Select someone with whom  you feel comfortable to be with you day and night for the first week.This person will help you with your exercises and personal care, like bathing and using the toilet.  Arrange for drivers to bring you to and from your doctor and therapy appointments, as well as to the grocery store and other places you need to go, for at least 4 to 6 weeks.  Select 2 or 3 rehab facilities where you would be comfortable recovering just in case you are not able to go directly home to recover. PREPARING YOUR HOME  Remove all clutter from your floors.  To see if you will be able to move in these spaces with a wheeled walker, hold your hands out about 6 inches from your sides. Then walk from your bed to the bathroom. Walk from your resting spot to your kitchen and bathroom.If you do not hit anything with your  hands, you probably have enough room.  Remove throw rugs.  Move the items you use often to shelves and drawers that are at countertop height. Move items in your bathroom, kitchen, and bedroom.  Prepare a few meals that you can freeze and reheat later.  Do not plan on recovering in bed. It is better for your health to sit upright.You may wish to use a recliner with a small table nearby.Keep the items you use most frequently on that table. These may include the TV remote, a cordless phone, a book or laptop computer, water glass, and any other items of your choice.  Consider adding grab bars in the shower and near the toilet.  While you are in the hospital, you will learn about equipment helpful for your recovery.Some of the equipment includes raised toilet seats, tub benches, and shower benches.Often, your hospital care team will help you decide what you need and can direct you about where to buy these items.You may not need all of these items, and they are not often returnable, so it is not recommended that you buy them before going to the hospital. PREPARING YOUR BODY  Complete a pre-operative  exam.This will ensure that your body is healthy enough to safely complete this surgery.Be certain to bring a complete list of all your medicines and supplements (herbs and vitamins). You may be advised to have additional tests to ensure your safety.  Complete elective dental care and routine cleanings before the surgery.Germs from anywhere in your body, including those in your mouth, can travel to your new joint and infect it. It is important not to have any dental work performed for at least 3 months after your surgery.After surgery, be sure to tell your dentist about your joint replacement.  Maintain a healthy diet.Unless advised by your surgeon, do not drastically change your diet before your surgery.  Quit smoking.Get help from your caregiver if you need it.  The day before your surgery, follow your surgeon's directions for showering, eating and drinking.These directions are for your safety. EXERCISES Your caregiver may have you do the following exercises before your surgery. Be sure to follow the exercise program your caregiver prescribes for you. Doing the exercises on both sides will help prepare your "good" sideas well. While completing these exercises, remember:   Stretch as long as you can, up to 30 seconds, without pain developing.  You should only feel a gentle lengthening or release in the stretched tissue. Ankle Pumps 1. While sitting with your knees straight, draw the top of your feet upwards by flexing your ankles. 2. Then, reverse the motion, pointing your toes downward. 3. Repeat 10 to 20 times.Complete this exercise 1 to 2 times per day. Heel Slides 1. Lie on your back with both knees straight.(If this causes back discomfort, bend your opposite knee, placing your foot flat on the floor.) 2. Slowly slide your heel back toward your buttocks until you feel a gentle stretch in the front of your knee or thigh. 3. Slowly slide your heel back to the starting  position. 4. Repeat 10 to 20 times.Complete this exercise 1 to 2 times per day. Quadriceps Sets 1. Lie on your back with your sore leg extended and your opposite knee bent. 2. Gradually tense the muscles in the front of your thigh.You should see either your kneecap slide up toward your hip or increased dimpling just above the knee.This motion will push the back of the knee down toward the floor. 3. Hold the  muscle as tight as you can without increasing your pain for 10 seconds. 4. Relax the muscles slowly and completely in between each repetition. 5. Repeat 10 to 20 times.Complete this exercise 1 to 2 times per day. Short Arc Kicks 1. Lie on your back.Place a 4 to 6 inch towel roll under your sore knee so that the knee slightly bends. 2. Raise only your lower leg by tightening the muscles in the front of your thigh.Do not allow your thigh to rise. 3. Hold this position for 5 seconds. 4. Repeat 10 to 20 times.Complete this exercise 1 to 2 times per day. Straight Leg Raises 1. Lie on your back with your sore leg extended and your opposite knee bent. 2. Tense the muscles in the front of your sore thigh.You should see either your kneecap slide up or increased dimpling just above the knee. Your thigh may even quiver. 3. Tighten these muscles even more and raise your leg 4 to 6 inches off the floor.Hold for 3 to 5 seconds. 4. Keeping these muscles tense, lower your leg. 5. Relax the muscles slowly and completely in between each repetition. 6. Repeat 10 to 20 times.Complete this exercise 1 to 2 times per day. Arm Chair Push-ups 1. Find a firm, non-wheeled chair with solid armrests. 2. Sitting in the chair, extend your sore leg straight out in front of you. 3. Lift up your body weight, using your arms and opposite leg. 4. Slowly lower your body weight. 5. Repeat 10 to 20 times.Complete this exercise 1 to 2 times per day. Document Released: 11/21/2010 Document Revised: 11/09/2011  Document Reviewed: 11/21/2010 [facility] Patient Information 2014 [place], [place].  REMY DIA  04/12/2013   Your procedure is scheduled on:  Monday, 04/17/13  [facility] to [facility] [place] [facility].  Call this number if you have problems the morning of surgery: [phone]   Remember:   Do not eat food or drink liquids after midnight.   Take these medicines the morning of surgery with A SIP OF WATER: flexeril and tramadol   Do not wear jewelry, make-up or nail polish.  Do not wear lotions, powders, or perfumes. You may wear deodorant.  Do not shave 48 hours prior to surgery. Men may shave face and neck.  Do not bring valuables to the hospital.  [facility] is not responsible                   for any belongings or valuables.  Contacts, dentures or bridgework may not be worn into surgery.  Leave suitcase in the car. After surgery it may be brought to your room.  For patients admitted to the hospital, checkout time is 11:00 AM the day of  discharge.   Patients discharged the day of surgery will not be allowed to drive  home.  Name and phone number of your driver: sister  Special Instructions: Incentive Spirometry - Practice and bring it with you on the day of surgery. Shower using CHG 2 nights before surgery and the night before surgery.  If you shower the day of surgery use CHG.  Use special wash - you have one bottle of CHG for all showers.  You should use approximately 1/3 of the bottle for each shower.   Please read over the following fact sheets that you were given: Pain Booklet, Coughing and Deep Breathing, Total Joint Packet, MRSA Information, Surgical Site Infection Prevention, Anesthesia Post-op Instructions and Care and Recovery After Surgery

## 2013-04-12 NOTE — Telephone Encounter (Signed)
Received call from [facility] at [facility] - ph [phone].  States patient tested positive for staph at pre-op appointment; she was given prescription for Mupiricon, which she will pick up from the hospital, to self-treat.

## 2013-04-13 NOTE — H&P (Addendum)
TOTAL KNEE ADMISSION H&P  Patient is being admitted for right total knee arthroplasty.  Subjective:Knee Pain       Chief Complaint:right knee pain.  HPI: [name], 52 y.o. female, right knee pain and some swelling for several years, gradual onset, no known injury- patient does have a history of back pain and leg pain  52 year old female reports lateral knee pain gradual onset started years ago partially relieved by ibuprofen as worse at night but per persist throughout the day she reports 10 out of 10 pain which is sharp burning with swelling but no mechanical symptoms  Review of systems rash itching and redness related to her skin she also reports easy bleeding and bruising and joint stiffness otherwise normal review of systems  No major medical problems   Patient Active Problem List   Diagnosis Date Noted  . Arthritis of knee, right 03/30/2013  . Shoulder mass 08/13/2011   Past Medical History  Diagnosis Date  . Eczema   . Muscle spasm of back   . Shortness of breath     with exertion   . Arthritis     Past Surgical History  Procedure Laterality Date  . No past surgeries    . Lipoma excision  11/16/2011    Procedure: EXCISION LIPOMA;  Surgeon: Marlane Hatcher, MD;  Location: [facility];  Service: General;  Laterality: Left;  Excision of neoplasm left shoulder  . Tubal ligation      and burned per patient.   . Multiple extractions with alveoloplasty  12/28/2011    Procedure: MULTIPLE EXTRACION WITH ALVEOLOPLASTY;  Surgeon: Georgia Lopes, DDS;  Location: [facility] OR;  Service: Oral Surgery;  Laterality: Bilateral;  . Mouth surgery    . Shoulder surgery      No prescriptions prior to admission   Allergies  Allergen Reactions  . Chocolate Hives    History  Substance Use Topics  . Smoking status: Former Smoker -- 0.10 packs/day for 1 years    Types: Cigarettes  . Smokeless tobacco: Never Used  . Alcohol Use: No    Family History  Problem Relation Age of Onset   . Arthritis    . Anesthesia problems Neg Hx   . Hypotension Neg Hx   . Malignant hyperthermia Neg Hx   . Pseudochol deficiency Neg Hx   . Diabetes Brother      ROSsee above   Objective:  Physical Exam  Vital signs in last 24 hours:   Physical Exam  Constitutional: Vital signs are normal. She appears well-developed and well-nourished.  Non-toxic appearance.  Neck: Neck supple.  Cardiovascular: Intact distal pulses.   Musculoskeletal:       Right knee: She exhibits normal range of motion, no swelling, no effusion, no ecchymosis, no deformity, no laceration, no erythema, normal alignment, no LCL laxity, normal patellar mobility, no bony tenderness, normal meniscus and no MCL laxity. tenderness found. No MCL, no LCL and no patellar tendon tenderness noted.       Left knee: She exhibits normal range of motion, no swelling, no effusion, no ecchymosis, no deformity, no laceration, no erythema, normal alignment, no LCL laxity, normal patellar mobility, no bony tenderness, normal meniscus and no MCL laxity. tenderness found. No MCL, no LCL and no patellar tendon tenderness noted.       Upper extremity exam  Inspection and palpation revealed no abnormalities in the upper extremities.  Range of motion is full without contracture.  Motor exam is normal with grade  5 strength.  The joints are fully reduced without subluxation.  There is no atrophy or tremor and muscle tone is normal.  All joints are stable.      Neurological: She is alert.   BP 130/90  Ht 5\' 2"  (1.575 m)  Wt 202 lb (91.627 kg)  BMI 36.95 kg/m2  LMP 01/15/2012   X-rays were unremarkable she may have some mild degenerative changes  Recommend knee injection  Labs:   Estimated body mass index is 41.14 kg/(m^2) as calculated from the following:   Height as of 03/30/13: 5\' 2"  (1.575 m).   Weight as of 03/30/13: 225 lb (102.059 kg).   Imaging Review Plain radiographs demonstrate severe degenerative joint disease  of the right knee(s). The overall alignment issignificant valgus. The bone quality appears to be good for age and reported activity level.  Assessment/Plan:  End stage arthritis, right knee   RIGHT TOTAL KNEE   The patient history, physical examination, clinical judgment of the provider and imaging studies are consistent with end stage degenerative joint disease of the right knee(s) and total knee arthroplasty is deemed medically necessary. The treatment options including medical management, injection therapy arthroscopy and arthroplasty were discussed at length. The risks and benefits of total knee arthroplasty were presented and reviewed. The risks due to aseptic loosening, infection, stiffness, patella tracking problems, thromboembolic complications and other imponderables were discussed. The patient acknowledged the explanation, agreed to proceed with the plan and consent was signed. Patient is being admitted for inpatient treatment for surgery, pain control, PT, OT, prophylactic antibiotics, VTE prophylaxis, progressive ambulation and ADL's and discharge planning. The patient is planning to be discharged home with home health services

## 2013-04-17 ENCOUNTER — Inpatient Hospital Stay (HOSPITAL_COMMUNITY)
Admission: RE | Admit: 2013-04-17 | Discharge: 2013-04-20 | DRG: 470 | Disposition: A | Discharge status: Home Health | Payer: Medicaid Other | Source: Ambulatory Visit | Attending: Orthopedic Surgery | Admitting: Orthopedic Surgery

## 2013-04-17 ENCOUNTER — Encounter (HOSPITAL_COMMUNITY)
Admission: RE | Disposition: A | Discharge status: Home Health | Payer: Self-pay | Source: Ambulatory Visit | Attending: Orthopedic Surgery

## 2013-04-17 ENCOUNTER — Inpatient Hospital Stay (HOSPITAL_COMMUNITY): Payer: Medicaid Other

## 2013-04-17 ENCOUNTER — Encounter (HOSPITAL_COMMUNITY): Payer: Self-pay | Admitting: Anesthesiology

## 2013-04-17 ENCOUNTER — Inpatient Hospital Stay (HOSPITAL_COMMUNITY): Payer: Medicaid Other | Admitting: Anesthesiology

## 2013-04-17 ENCOUNTER — Encounter (HOSPITAL_COMMUNITY): Payer: Self-pay | Admitting: *Deleted

## 2013-04-17 DIAGNOSIS — M1711 Unilateral primary osteoarthritis, right knee: Secondary | ICD-10-CM

## 2013-04-17 DIAGNOSIS — M1710 Unilateral primary osteoarthritis, unspecified knee: Principal | ICD-10-CM | POA: Diagnosis present

## 2013-04-17 DIAGNOSIS — L259 Unspecified contact dermatitis, unspecified cause: Secondary | ICD-10-CM | POA: Diagnosis present

## 2013-04-17 DIAGNOSIS — Z87891 Personal history of nicotine dependence: Secondary | ICD-10-CM

## 2013-04-17 HISTORY — PX: TOTAL KNEE ARTHROPLASTY: SHX125

## 2013-04-17 SURGERY — ARTHROPLASTY, KNEE, TOTAL
Anesthesia: Spinal | Site: Knee | Laterality: Right | Wound class: Clean

## 2013-04-17 MED ORDER — CEFAZOLIN SODIUM-DEXTROSE 2-3 GM-% IV SOLR
2.0000 g | Freq: Four times a day (QID) | INTRAVENOUS | Status: AC
  Administered 2013-04-17 (×2): 2 g via INTRAVENOUS
  Filled 2013-04-17 (×2): qty 50

## 2013-04-17 MED ORDER — CLOBETASOL PROPIONATE 0.05 % EX OINT
TOPICAL_OINTMENT | Freq: Two times a day (BID) | CUTANEOUS | Status: DC
  Administered 2013-04-17 – 2013-04-20 (×6): via TOPICAL
  Filled 2013-04-17 (×2): qty 15

## 2013-04-17 MED ORDER — PREGABALIN 50 MG PO CAPS
ORAL_CAPSULE | ORAL | Status: AC
  Filled 2013-04-17: qty 1

## 2013-04-17 MED ORDER — FENTANYL CITRATE 0.05 MG/ML IJ SOLN
25.0000 ug | INTRAMUSCULAR | Status: DC
  Administered 2013-04-17: 25 ug via INTRAVENOUS

## 2013-04-17 MED ORDER — LIDOCAINE HCL (PF) 1 % IJ SOLN
INTRAMUSCULAR | Status: AC
  Filled 2013-04-17: qty 5

## 2013-04-17 MED ORDER — TRANEXAMIC ACID 100 MG/ML IV SOLN
1000.0000 mg | INTRAVENOUS | Status: AC
  Administered 2013-04-17: 1000 mg via INTRAVENOUS
  Filled 2013-04-17: qty 10

## 2013-04-17 MED ORDER — ASPIRIN EC 325 MG PO TBEC
325.0000 mg | DELAYED_RELEASE_TABLET | Freq: Two times a day (BID) | ORAL | Status: DC
  Administered 2013-04-18 – 2013-04-20 (×5): 325 mg via ORAL
  Filled 2013-04-17 (×5): qty 1

## 2013-04-17 MED ORDER — PHENYLEPHRINE HCL 10 MG/ML IJ SOLN
INTRAMUSCULAR | Status: AC
  Filled 2013-04-17: qty 1

## 2013-04-17 MED ORDER — METOCLOPRAMIDE HCL 10 MG PO TABS: 5.0000 mg | ORAL_TABLET | Freq: Three times a day (TID) | ORAL | Status: DC | PRN

## 2013-04-17 MED ORDER — CEFAZOLIN SODIUM-DEXTROSE 2-3 GM-% IV SOLR
2.0000 g | INTRAVENOUS | Status: AC
  Administered 2013-04-17: 2 g via INTRAVENOUS

## 2013-04-17 MED ORDER — ONDANSETRON HCL 4 MG/2ML IJ SOLN
INTRAMUSCULAR | Status: AC
  Filled 2013-04-17: qty 2

## 2013-04-17 MED ORDER — METHOCARBAMOL 100 MG/ML IJ SOLN
500.0000 mg | Freq: Once | INTRAVENOUS | Status: DC
  Administered 2013-04-17: 500 mg via INTRAVENOUS
  Filled 2013-04-17: qty 5

## 2013-04-17 MED ORDER — ONDANSETRON HCL 4 MG PO TABS: 4.0000 mg | ORAL_TABLET | Freq: Four times a day (QID) | ORAL | Status: DC | PRN

## 2013-04-17 MED ORDER — DEXTROSE 5 % IV SOLN
500.0000 mg | Freq: Four times a day (QID) | INTRAVENOUS | Status: DC | PRN
  Filled 2013-04-17: qty 5

## 2013-04-17 MED ORDER — CELECOXIB 100 MG PO CAPS
200.0000 mg | ORAL_CAPSULE | Freq: Two times a day (BID) | ORAL | Status: DC
  Administered 2013-04-17 – 2013-04-20 (×5): 200 mg via ORAL
  Filled 2013-04-17 (×8): qty 2

## 2013-04-17 MED ORDER — TRAVOPROST 0.004 % OP SOLN
1.0000 [drp] | Freq: Every day | OPHTHALMIC | Status: DC
  Filled 2013-04-17 (×9): qty 0.1

## 2013-04-17 MED ORDER — ACETAMINOPHEN 325 MG PO TABS: 650.0000 mg | ORAL_TABLET | Freq: Four times a day (QID) | ORAL | Status: DC | PRN

## 2013-04-17 MED ORDER — LACTATED RINGERS IV SOLN
INTRAVENOUS | Status: DC
  Administered 2013-04-17 (×2): via INTRAVENOUS

## 2013-04-17 MED ORDER — EPHEDRINE SULFATE 50 MG/ML IJ SOLN
INTRAMUSCULAR | Status: AC
  Filled 2013-04-17: qty 1

## 2013-04-17 MED ORDER — ONDANSETRON HCL 4 MG/2ML IJ SOLN: 4.0000 mg | Freq: Four times a day (QID) | INTRAMUSCULAR | Status: DC | PRN

## 2013-04-17 MED ORDER — OXYCODONE-ACETAMINOPHEN 5-325 MG PO TABS
1.0000 | ORAL_TABLET | ORAL | Status: DC
  Administered 2013-04-17 – 2013-04-19 (×10): 1 via ORAL
  Filled 2013-04-17 (×11): qty 1

## 2013-04-17 MED ORDER — OXYCODONE HCL 5 MG PO TABS
ORAL_TABLET | ORAL | Status: AC
  Filled 2013-04-17: qty 1

## 2013-04-17 MED ORDER — SODIUM CHLORIDE 0.9 % IJ SOLN
INTRAMUSCULAR | Status: DC | PRN
  Administered 2013-04-17: 09:00:00

## 2013-04-17 MED ORDER — METHOCARBAMOL 500 MG PO TABS: 500.0000 mg | ORAL_TABLET | Freq: Four times a day (QID) | ORAL | Status: DC | PRN

## 2013-04-17 MED ORDER — OXYCODONE HCL 5 MG PO TABS
5.0000 mg | ORAL_TABLET | Freq: Once | ORAL | Status: DC
  Administered 2013-04-17: 5 mg via ORAL

## 2013-04-17 MED ORDER — POLYETHYLENE GLYCOL 3350 17 G PO PACK
17.0000 g | PACK | Freq: Every day | ORAL | Status: DC
  Administered 2013-04-17 – 2013-04-20 (×4): 17 g via ORAL
  Filled 2013-04-17 (×4): qty 1

## 2013-04-17 MED ORDER — FENTANYL CITRATE 0.05 MG/ML IJ SOLN
INTRAMUSCULAR | Status: AC
  Filled 2013-04-17: qty 2

## 2013-04-17 MED ORDER — ONDANSETRON HCL 4 MG/2ML IJ SOLN
4.0000 mg | Freq: Once | INTRAMUSCULAR | Status: AC
  Administered 2013-04-17: 4 mg via INTRAVENOUS

## 2013-04-17 MED ORDER — SODIUM CHLORIDE 0.9 % IR SOLN
Status: DC | PRN
  Administered 2013-04-17: 3000 mL

## 2013-04-17 MED ORDER — METOCLOPRAMIDE HCL 5 MG/ML IJ SOLN: 5.0000 mg | Freq: Three times a day (TID) | INTRAMUSCULAR | Status: DC | PRN

## 2013-04-17 MED ORDER — SODIUM CHLORIDE 0.9 % IV SOLN
INTRAVENOUS | Status: DC
  Administered 2013-04-17 – 2013-04-18 (×5): via INTRAVENOUS

## 2013-04-17 MED ORDER — CLOBETASOL PROPIONATE 0.05 % EX CREA
1.0000 "application " | TOPICAL_CREAM | Freq: Two times a day (BID) | CUTANEOUS | Status: DC | PRN
  Filled 2013-04-17: qty 15

## 2013-04-17 MED ORDER — PROPOFOL 10 MG/ML IV EMUL
INTRAVENOUS | Status: AC
  Filled 2013-04-17: qty 20

## 2013-04-17 MED ORDER — FENTANYL CITRATE 0.05 MG/ML IJ SOLN
25.0000 ug | INTRAMUSCULAR | Status: DC | PRN
  Administered 2013-04-17: 50 ug via INTRAVENOUS

## 2013-04-17 MED ORDER — EPHEDRINE SULFATE 50 MG/ML IJ SOLN
INTRAMUSCULAR | Status: DC | PRN
  Administered 2013-04-17: 10 mg via INTRAVENOUS

## 2013-04-17 MED ORDER — PHENYLEPHRINE HCL 10 MG/ML IJ SOLN
INTRAMUSCULAR | Status: DC | PRN
  Administered 2013-04-17 (×2): 100 ug via INTRAVENOUS
  Administered 2013-04-17: 150 ug via INTRAVENOUS
  Administered 2013-04-17: 100 ug via INTRAVENOUS
  Administered 2013-04-17: 150 ug via INTRAVENOUS
  Administered 2013-04-17 (×2): 100 ug via INTRAVENOUS
  Administered 2013-04-17 (×2): 150 ug via INTRAVENOUS

## 2013-04-17 MED ORDER — FENTANYL CITRATE 0.05 MG/ML IJ SOLN
INTRAMUSCULAR | Status: DC | PRN
  Administered 2013-04-17: 25 ug via INTRAVENOUS
  Administered 2013-04-17: 25 ug via INTRATHECAL
  Administered 2013-04-17: 50 ug via INTRAVENOUS

## 2013-04-17 MED ORDER — MIDAZOLAM HCL 2 MG/2ML IJ SOLN
INTRAMUSCULAR | Status: AC
  Filled 2013-04-17: qty 2

## 2013-04-17 MED ORDER — CELECOXIB 100 MG PO CAPS
400.0000 mg | ORAL_CAPSULE | Freq: Once | ORAL | Status: DC
  Administered 2013-04-17: 400 mg via ORAL

## 2013-04-17 MED ORDER — MENTHOL 3 MG MT LOZG
1.0000 | LOZENGE | OROMUCOSAL | Status: DC | PRN
  Filled 2013-04-17: qty 9

## 2013-04-17 MED ORDER — PROPOFOL INFUSION 10 MG/ML OPTIME
INTRAVENOUS | Status: DC | PRN
  Administered 2013-04-17: 09:00:00 via INTRAVENOUS
  Administered 2013-04-17: 25 ug/kg/min via INTRAVENOUS

## 2013-04-17 MED ORDER — ZOLPIDEM TARTRATE 5 MG PO TABS: 10.0000 mg | ORAL_TABLET | Freq: Every evening | ORAL | Status: DC | PRN

## 2013-04-17 MED ORDER — BUPIVACAINE-EPINEPHRINE (PF) 0.5% -1:200000 IJ SOLN
INTRAMUSCULAR | Status: DC | PRN
  Administered 2013-04-17: 30 mL

## 2013-04-17 MED ORDER — BUPIVACAINE IN DEXTROSE 0.75-8.25 % IT SOLN
INTRATHECAL | Status: DC | PRN
  Administered 2013-04-17: 13 mg via INTRATHECAL

## 2013-04-17 MED ORDER — HYDROMORPHONE HCL PF 1 MG/ML IJ SOLN
0.5000 mg | INTRAMUSCULAR | Status: DC | PRN
  Administered 2013-04-17 – 2013-04-18 (×4): 0.5 mg via INTRAVENOUS
  Filled 2013-04-17 (×4): qty 1

## 2013-04-17 MED ORDER — BUPIVACAINE LIPOSOME 1.3 % IJ SUSP
20.0000 mL | Freq: Once | INTRAMUSCULAR | Status: DC
  Filled 2013-04-17: qty 20

## 2013-04-17 MED ORDER — CEFAZOLIN SODIUM-DEXTROSE 2-3 GM-% IV SOLR
INTRAVENOUS | Status: AC
  Filled 2013-04-17: qty 50

## 2013-04-17 MED ORDER — CYCLOBENZAPRINE HCL 10 MG PO TABS
10.0000 mg | ORAL_TABLET | Freq: Four times a day (QID) | ORAL | Status: DC | PRN
  Administered 2013-04-17: 10 mg via ORAL
  Filled 2013-04-17: qty 1

## 2013-04-17 MED ORDER — MIDAZOLAM HCL 5 MG/5ML IJ SOLN
INTRAMUSCULAR | Status: DC | PRN
  Administered 2013-04-17 (×2): 1 mg via INTRAVENOUS

## 2013-04-17 MED ORDER — MEGESTROL ACETATE 40 MG PO TABS
40.0000 mg | ORAL_TABLET | Freq: Every day | ORAL | Status: DC
  Administered 2013-04-17 – 2013-04-20 (×4): 40 mg via ORAL
  Filled 2013-04-17 (×5): qty 1

## 2013-04-17 MED ORDER — ONDANSETRON HCL 4 MG/2ML IJ SOLN: 4.0000 mg | Freq: Once | INTRAMUSCULAR | Status: DC | PRN

## 2013-04-17 MED ORDER — ACETAMINOPHEN 650 MG RE SUPP: 650.0000 mg | Freq: Four times a day (QID) | RECTAL | Status: DC | PRN

## 2013-04-17 MED ORDER — 0.9 % SODIUM CHLORIDE (POUR BTL) OPTIME
TOPICAL | Status: DC | PRN
  Administered 2013-04-17: 1000 mL

## 2013-04-17 MED ORDER — BUPIVACAINE-EPINEPHRINE PF 0.5-1:200000 % IJ SOLN
INTRAMUSCULAR | Status: AC
  Filled 2013-04-17: qty 30

## 2013-04-17 MED ORDER — LATANOPROST 0.005 % OP SOLN
1.0000 [drp] | Freq: Every day | OPHTHALMIC | Status: DC
  Administered 2013-04-17: 1 [drp] via OPHTHALMIC
  Filled 2013-04-17: qty 2.5

## 2013-04-17 MED ORDER — PREGABALIN 50 MG PO CAPS
50.0000 mg | ORAL_CAPSULE | Freq: Once | ORAL | Status: AC
  Administered 2013-04-17: 50 mg via ORAL

## 2013-04-17 MED ORDER — PHENOL 1.4 % MT LIQD: 1.0000 | OROMUCOSAL | Status: DC | PRN

## 2013-04-17 MED ORDER — CELECOXIB 100 MG PO CAPS
ORAL_CAPSULE | ORAL | Status: AC
  Filled 2013-04-17: qty 4

## 2013-04-17 MED ORDER — MIDAZOLAM HCL 2 MG/2ML IJ SOLN
1.0000 mg | INTRAMUSCULAR | Status: DC | PRN
  Administered 2013-04-17: 2 mg via INTRAVENOUS

## 2013-04-17 SURGICAL SUPPLY — 75 items
BAG HAMPER (MISCELLANEOUS) ×2 IMPLANT
BANDAGE ELASTIC 6 VELCRO NS (GAUZE/BANDAGES/DRESSINGS) ×1 IMPLANT
BANDAGE ESMARK 6X9 LF (GAUZE/BANDAGES/DRESSINGS) ×1 IMPLANT
BIT DRILL 3.2X128 (BIT) IMPLANT
BLADE HEX COATED 2.75 (ELECTRODE) ×2 IMPLANT
BLADE SAG 18X100X1.27 (BLADE) IMPLANT
BLADE SAGITTAL 25.0X1.27X90 (BLADE) ×2 IMPLANT
BLADE SAW SAG 90X13X1.27 (BLADE) ×2 IMPLANT
BNDG CMPR 9X6 STRL LF SNTH (GAUZE/BANDAGES/DRESSINGS) ×1
BNDG ESMARK 6X9 LF (GAUZE/BANDAGES/DRESSINGS) ×2
BOWL SMART MIX CTS (DISPOSABLE) IMPLANT
CAPT FB KNEE W/COCR XLINK ×1 IMPLANT
CEMENT HV SMART SET (Cement) ×4 IMPLANT
CLOTH BEACON ORANGE TIMEOUT ST (SAFETY) ×2 IMPLANT
COVER LIGHT HANDLE STERIS (MISCELLANEOUS) ×4 IMPLANT
COVER PROBE W GEL 5X96 (DRAPES) ×2 IMPLANT
CUFF TOURNIQUET SINGLE 34IN LL (TOURNIQUET CUFF) ×1 IMPLANT
CUFF TOURNIQUET SINGLE 44IN (TOURNIQUET CUFF) IMPLANT
DECANTER SPIKE VIAL GLASS SM (MISCELLANEOUS) ×4 IMPLANT
DRAPE BACK TABLE (DRAPES) ×2 IMPLANT
DRAPE EXTREMITY T 121X128X90 (DRAPE) ×2 IMPLANT
DRSG MEPILEX BORDER 4X12 (GAUZE/BANDAGES/DRESSINGS) ×2 IMPLANT
DURAPREP 26ML APPLICATOR (WOUND CARE) ×4 IMPLANT
ELECT REM PT RETURN 9FT ADLT (ELECTROSURGICAL) ×2
ELECTRODE REM PT RTRN 9FT ADLT (ELECTROSURGICAL) ×1 IMPLANT
EVACUATOR 3/16  PVC DRAIN (DRAIN) ×1
EVACUATOR 3/16 PVC DRAIN (DRAIN) ×1 IMPLANT
FACESHIELD LNG OPTICON STERILE (SAFETY) IMPLANT
GLOVE BIOGEL PI IND STRL 6.5 (GLOVE) IMPLANT
GLOVE BIOGEL PI IND STRL 7.0 (GLOVE) IMPLANT
GLOVE BIOGEL PI IND STRL 8 (GLOVE) IMPLANT
GLOVE BIOGEL PI INDICATOR 6.5 (GLOVE) ×2
GLOVE BIOGEL PI INDICATOR 7.0 (GLOVE) ×4
GLOVE BIOGEL PI INDICATOR 8 (GLOVE) ×1
GLOVE ECLIPSE 6.5 STRL STRAW (GLOVE) ×4 IMPLANT
GLOVE EXAM NITRILE MD LF STRL (GLOVE) ×2 IMPLANT
GLOVE OPTIFIT SS 8.0 STRL (GLOVE) ×2 IMPLANT
GLOVE SKINSENSE NS SZ8.0 LF (GLOVE) ×1
GLOVE SKINSENSE STRL SZ8.0 LF (GLOVE) ×2 IMPLANT
GLOVE SS BIOGEL STRL SZ 8 (GLOVE) IMPLANT
GLOVE SS N UNI LF 8.5 STRL (GLOVE) ×2 IMPLANT
GLOVE SUPERSENSE BIOGEL SZ 8 (GLOVE) ×1
GOWN STRL REIN XL XLG (GOWN DISPOSABLE) ×10 IMPLANT
HANDPIECE INTERPULSE COAX TIP (DISPOSABLE) ×2
HOOD W/PEELAWAY (MISCELLANEOUS) ×8 IMPLANT
INST SET MAJOR BONE (KITS) ×2 IMPLANT
IV NS IRRIG 3000ML ARTHROMATIC (IV SOLUTION) ×2 IMPLANT
KIT BLADEGUARD II DBL (SET/KITS/TRAYS/PACK) ×2 IMPLANT
KIT ROOM TURNOVER APOR (KITS) ×2 IMPLANT
MANIFOLD NEPTUNE II (INSTRUMENTS) ×2 IMPLANT
MARKER SKIN DUAL TIP RULER LAB (MISCELLANEOUS) ×2 IMPLANT
NDL HYPO 21X1.5 SAFETY (NEEDLE) ×1 IMPLANT
NDL HYPO 25X1 1.5 SAFETY (NEEDLE) ×1 IMPLANT
NEEDLE HYPO 21X1.5 SAFETY (NEEDLE) ×2 IMPLANT
NEEDLE HYPO 25X1 1.5 SAFETY (NEEDLE) ×2 IMPLANT
NS IRRIG 1000ML POUR BTL (IV SOLUTION) ×2 IMPLANT
PACK TOTAL JOINT (CUSTOM PROCEDURE TRAY) ×2 IMPLANT
PAD ARMBOARD 7.5X6 YLW CONV (MISCELLANEOUS) ×2 IMPLANT
PAD DANNIFLEX CPM (ORTHOPEDIC SUPPLIES) ×2 IMPLANT
PIN TROCAR 3 INCH (PIN) ×2 IMPLANT
SET BASIN LINEN APH (SET/KITS/TRAYS/PACK) ×2 IMPLANT
SET HNDPC FAN SPRY TIP SCT (DISPOSABLE) ×1 IMPLANT
SPONGE DRAIN TRACH 4X4 STRL 2S (GAUZE/BANDAGES/DRESSINGS) ×2 IMPLANT
STAPLER VISISTAT 35W (STAPLE) ×2 IMPLANT
SUT BRALON NAB BRD #1 30IN (SUTURE) ×2 IMPLANT
SUT MON AB 0 CT1 (SUTURE) ×2 IMPLANT
SUT MON AB 2-0 CT1 36 (SUTURE) ×2 IMPLANT
SYR 20CC LL (SYRINGE) IMPLANT
SYR 30ML LL (SYRINGE) ×2 IMPLANT
SYR BULB IRRIGATION 50ML (SYRINGE) ×2 IMPLANT
TOWEL OR 17X26 4PK STRL BLUE (TOWEL DISPOSABLE) ×2 IMPLANT
TOWER CARTRIDGE SMART MIX (DISPOSABLE) ×2 IMPLANT
TRAY FOLEY CATH 16FR SILVER (SET/KITS/TRAYS/PACK) ×2 IMPLANT
WATER STERILE IRR 1000ML POUR (IV SOLUTION) ×8 IMPLANT
YANKAUER SUCT 12FT TUBE ARGYLE (SUCTIONS) ×2 IMPLANT

## 2013-04-17 NOTE — Anesthesia Preprocedure Evaluation (Signed)
Anesthesia Evaluation  Patient identified by MRN, date of birth, ID band Patient awake    Reviewed: Allergy & Precautions, H&P , NPO status , Patient's Chart, lab work & pertinent test results  Airway Mallampati: II      Dental  (+) Missing, Poor Dentition, Chipped and Dental Advisory Given   Pulmonary neg pulmonary ROS, shortness of breath and with exertion, Current Smoker,  breath sounds clear to auscultation        Cardiovascular negative cardio ROS  Rhythm:Regular Rate:Normal     Neuro/Psych Anxiety    GI/Hepatic negative GI ROS,   Endo/Other    Renal/GU      Musculoskeletal   Abdominal   Peds  Hematology   Anesthesia Other Findings   Reproductive/Obstetrics                           Anesthesia Physical Anesthesia Plan  ASA: II  Anesthesia Plan: Spinal   Post-op Pain Management:    Induction: Intravenous  Airway Management Planned: Nasal Cannula  Additional Equipment:   Intra-op Plan:   Post-operative Plan:   Informed Consent: I have reviewed the patients History and Physical, chart, labs and discussed the procedure including the risks, benefits and alternatives for the proposed anesthesia with the patient or authorized representative who has indicated his/her understanding and acceptance.     Plan Discussed with:   Anesthesia Plan Comments:         Anesthesia Quick Evaluation  

## 2013-04-17 NOTE — Anesthesia Procedure Notes (Signed)
Spinal  Patient location during procedure: OR Start time: 04/17/2013 7:51 AM Staffing CRNA/Resident: Glynn Octave E Preanesthetic Checklist Completed: patient identified, site marked, surgical consent, pre-op evaluation, timeout performed, IV checked, risks and benefits discussed and monitors and equipment checked Spinal Block Patient position: right lateral decubitus Prep: Betadine Patient monitoring: heart rate, cardiac monitor, continuous pulse ox and blood pressure Approach: right paramedian Location: L3-4 Injection technique: single-shot Needle Needle type: Spinocan  Needle gauge: 22 G Needle length: 9 cm Assessment Sensory level: T8 Additional Notes ATTEMPTS:1 TRAY AV:[id number] TRAY EXPIRATION DATE:07/2013

## 2013-04-17 NOTE — Anesthesia Postprocedure Evaluation (Addendum)
  Anesthesia Post-op Note  Patient: [name]  Procedure(s) Performed: Procedure(s): RIGHT TOTAL KNEE ARTHROPLASTY (Right)  Patient Location: PACU  Anesthesia Type:Spinal  Level of Consciousness: awake, alert  and oriented  Airway and Oxygen Therapy: Patient Spontanous Breathing and Patient connected to nasal cannula oxygen  Post-op Pain: none  Post-op Assessment: Post-op Vital signs reviewed, Patient's Cardiovascular Status Stable, Respiratory Function Stable, Patent Airway and No signs of Nausea or vomiting  Post-op Vital Signs: Reviewed  Complications: No apparent anesthesia complications 04/18/17  Patient doing well, VSS.  No apparent anesthesia complications.

## 2013-04-17 NOTE — Interval H&P Note (Signed)
History and Physical Interval Note:  04/17/2013 7:17 AM  [name]  has presented today for surgery, with the diagnosis of Osteoarthritis right knee  The various methods of treatment have been discussed with the patient and family. After consideration of risks, benefits and other options for treatment, the patient has consented to  Procedure(s): TOTAL KNEE ARTHROPLASTY (Right) as a surgical intervention .  The patient's history has been reviewed, patient examined, no change in status, stable for surgery.  I have reviewed the patient's chart and labs.  Questions were answered to the patient's satisfaction.     Fuller Canada

## 2013-04-17 NOTE — Progress Notes (Signed)
UR Chart Review Completed  

## 2013-04-17 NOTE — Brief Op Note (Signed)
04/17/2013  9:46 AM  PATIENT:  [name]  52 y.o. female  PRE-OPERATIVE DIAGNOSIS:  Osteoarthritis right knee  POST-OPERATIVE DIAGNOSIS:  Osteoarthritis right knee  severe lateral compartment oa with moderate patellofemoral oa   PROCEDURE:  Procedure(s): RIGHT TOTAL KNEE ARTHROPLASTY (Right)  SURGEON:  Surgeon(s) and Role:    * Vickki Hearing, MD - Primary  PHYSICIAN ASSISTANT:   ASSISTANTS: wayne mcfatter and betty ashley    ANESTHESIA:   spinal  EBL:  Total I/O In: 1000 [I.V.:1000] Out: 150 [Urine:125; Blood:25]  BLOOD ADMINISTERED:none  DRAINS: hemovac drain in joint    LOCAL MEDICATIONS USED:  MARCAINE 0.5% with epi 3  , Amount: 30  ml and OTHER exparel 20ml diluted with 40 cc saline   SPECIMEN:  No Specimen  DISPOSITION OF SPECIMEN:  N/A  COUNTS:  YES  TOURNIQUET:   Total Tourniquet Time Documented: Thigh (Right) - 76 minutes Total: Thigh (Right) - 76 minutes   DICTATION: .Reubin Milan Dictation  PLAN OF CARE: Admit to inpatient   PATIENT DISPOSITION:  PACU - hemodynamically stable.   Delay start of Pharmacological VTE agent (>24hrs) due to surgical blood loss or risk of bleeding: yes

## 2013-04-17 NOTE — Op Note (Signed)
Preop diagnosis osteoarthritis right  knee Postop diagnosis same Procedure right total knee arthroplasty Surgeon Romeo Apple Assisted by Deniece Portela mcfatter and betty ashley Anesthesia spinal Findings severe oa lateral compartment and moderate oa patellofemoral compartment  Tourniquet time , pressure 300 mm of mercury  Indications for procedure disabling knee pain, failure to control pain with nonoperative measures  Details of procedure:  In the preop area the patient's right knee was marked and countersigned by the surgeon, the chart was updated, consent was signed  The patient was taken to the operating room for spinal anesthetic followed by administering 2 g of Ancef based on weight of <120 kg  A Foley catheter was inserted sterilely, then the operative extremity  was prepped and draped sterilely  The timeout was completed  The limb was then exsanguinated with a six-inch Esmarch with the knee in flexion and the tourniquet was elevated to 300 mm of mercury. A midline incision was made, the subcutaneous tissues were divided down to the extensor mechanism. A medial arthrotomy was performed, the patella was everted,  the fat pad was resected. The medial and  lateral menisci were resected. The medial soft tissue sleeve was elevated to the mid coronal plane. The anterior cruciate ligament and PCL were resected. Osteophytes were removed. The distal femur anterior surface was skeletonized with sharp dissection.  A three-eighths inch drill bit was used to enter the femoral canal which was suctioned and irrigated until the fluid was clear;  an intramedullary rod was placed in the femur with a 5 valgus right, 10 mm resection setting, the block was pinned in place; then a 10mm distal femoral resection was performed. The cut was checked for flatness.  The sizing guide was then placed on the femur, the femur measured a size 2.5; the block was pinned in external rotation 3 using the epicondyles as  reference; a  4-in-1 cutting block was placed and the 4 cuts were made with retractors protecting the collateral ligaments. The posterior osteophytes were removed with a curved osteotome. Residual PCL tissue was resected; residual meniscal tissue was resected.  The external tibial alignment instrument was set for tibial resection. The guide was   placed referencing the medial side which was the worn side and the stylus was set at 2 mm resection. Anterior slope was built in to match the patients anatomy; a neutral varus valgus cut was set using the medial third of the tibial tubercle as reference along with the medial portion of the lateral tibial spine. The guide was stabilized with pins.  A saw was used to resect the anterior tibia. The tibia was sized to a size 3 base plate.  We then balanced the knee using spacer blocks and laminar spreaders. The knee was stable and balanced in flexion and extension  with a 10 spacer.   The size 2.5 box cutting guide was placed to resect the bone for the post. We then turned our attention to the patella.  The patella measured 22 mm in thickness; we set the guide to leave 16 mm of patella.  After resection of the patella,  It was remeasured, and measured 14 mm. I sized the patella to a  38. We then drilled the 3 peg holes.   We then did a trial reduction. The trial reduction confirmed that the knee  balanced in extension and balance in flexion. Passive flexion equalled 115, and patella tracking was normal.  The tibial rotation was set avoiding internal rotation, allowing congruency with the femur.  We then punched the tibia per technique with a 3 base plate.   The bone was then irrigated and dried while cement was mixed on the back table. The implants were checked for accuracy and then cemented in place; excess cement was removed; the cement was allowed to cure. The wound was then irrigated with copious amounts of saline, the posterior capsule was injected with  exparel 20 diluted with 40 cc saline and 30cc of Marcaine with epinephrine . The 10 insert was placed. The range of motion and stability matched trial reduction  The capsule was closed with #1 Bralon in interrupted and running fashion and then the joint was injected with 30 cc of Marcaine with epinephrine.  The subcutaneous tissues were closed with 0 Monocryl.  Staples were used to reapproximate the skin edges.  Sterile dressings were applied.   The patient was then taken to the recovery room in stable condition.  Routine postop plan for knee replacement.

## 2013-04-17 NOTE — Transfer of Care (Signed)
Immediate Anesthesia Transfer of Care Note  Patient: [name]  Procedure(s) Performed: Procedure(s): RIGHT TOTAL KNEE ARTHROPLASTY (Right)  Patient Location: PACU  Anesthesia Type:Spinal  Level of Consciousness: awake, alert  and oriented  Airway & Oxygen Therapy: Patient Spontanous Breathing and Patient connected to nasal cannula oxygen  Post-op Assessment: Report given to PACU RN  Post vital signs: Reviewed and stable  Complications: No apparent anesthesia complications

## 2013-04-18 LAB — BASIC METABOLIC PANEL
CO2: 26 mEq/L (ref 19–32)
Calcium: 8.8 mg/dL (ref 8.4–10.5)
Glucose, Bld: 107 mg/dL — ABNORMAL HIGH (ref 70–99)
Sodium: 136 mEq/L (ref 135–145)

## 2013-04-18 LAB — CBC
Hemoglobin: 11.1 g/dL — ABNORMAL LOW (ref 12.0–15.0)
MCH: 27.2 pg (ref 26.0–34.0)
MCV: 82.6 fL (ref 78.0–100.0)
RBC: 4.08 MIL/uL (ref 3.87–5.11)

## 2013-04-18 MED ORDER — CELECOXIB 200 MG PO CAPS
200.0000 mg | ORAL_CAPSULE | Freq: Two times a day (BID) | ORAL | Status: DC
  Filled 2013-04-18 (×3): qty 1

## 2013-04-18 MED ORDER — KETOROLAC TROMETHAMINE 30 MG/ML IJ SOLN
30.0000 mg | Freq: Four times a day (QID) | INTRAMUSCULAR | Status: AC
  Administered 2013-04-18 – 2013-04-20 (×8): 30 mg via INTRAVENOUS
  Filled 2013-04-18 (×8): qty 1

## 2013-04-18 NOTE — Progress Notes (Signed)
Subjective: 1 Day Post-Op Procedure(s) (LRB): RIGHT TOTAL KNEE ARTHROPLASTY (Right) Patient reports pain as 10 on 0-10 scale.    Objective: Vital signs in last 24 hours: Temp:  [97.7 F (36.5 C)-98.4 F (36.9 C)] 98.4 F (36.9 C) (08/19 0543) Pulse Rate:  [86-106] 101 (08/19 0543) Resp:  [11-20] 20 (08/19 0543) BP: (103-164)/(60-98) 164/92 mmHg (08/19 0543) SpO2:  [94 %-100 %] 95 % (08/19 0543)  Intake/Output from previous day: 08/18 0701 - 08/19 0700 In: 1940 [P.O.:390; I.V.:1500; IV Piggyback:50] Out: 1150 [Urine:975; Drains:150; Blood:25] Intake/Output this shift:     Recent Labs  04/18/13 0450  HGB 11.1*    Recent Labs  04/18/13 0450  WBC 10.2  RBC 4.08  HCT 33.7*  PLT 265    Recent Labs  04/18/13 0450  NA 136  K 3.9  CL 102  CO2 26  BUN 7  CREATININE 0.70  GLUCOSE 107*  CALCIUM 8.8   No results found for this basename: LABPT, INR,  in the last 72 hours  Neurologically intact ABD soft Neurovascular intact Sensation intact distally Intact pulses distally Dorsiflexion/Plantar flexion intact Incision: scant drainage  Assessment/Plan: 1 Day Post-Op Procedure(s) (LRB): RIGHT TOTAL KNEE ARTHROPLASTY (Right) Advance diet Up with therapy  Fuller Canada 04/18/2013, 7:26 AM

## 2013-04-18 NOTE — Care Management Note (Signed)
    Page 1 of 2   04/20/2013     9:39:01 AM   CARE MANAGEMENT NOTE 04/20/2013  Patient:  [name], [name]   Account Number:  [id number]  Date Initiated:  04/18/2013  Documentation initiated by:  Sharrie Rothman  Subjective/Objective Assessment:   Pt admitted from home s/p right knee. Pt lives alone but her sister will be staying with pt at discharge for a while. Pt has a cane for home use.     Action/Plan:   Pt will need 3N1, rolling walker, and CPM for home use. Pt will also need Pt with HH and outpt for total of 10 visits. Pt did choose [facility] for [facility] and DME services.   Anticipated DC Date:  04/20/2013   Anticipated DC Plan:  HOME W HOME HEALTH SERVICES      DC Planning Services  CM consult      PAC Choice  DURABLE MEDICAL EQUIPMENT  HOME HEALTH   Choice offered to / List presented to:  C-1 Patient   DME arranged  3-N-1  CPM  WALKER - ROLLING      DME agency  [facility]     HH arranged  HH-2 PT      [place] agency  [facility]   Status of service:  Completed, signed off Medicare Important Message given?   (If response is "NO", the following Medicare IM given date fields will be blank) Date Medicare IM given:   Date Additional Medicare IM given:    Discharge Disposition:  HOME W HOME HEALTH SERVICES  Per UR Regulation:    If discussed at Long Length of Stay Meetings, dates discussed:    Comments:  04/20/13 0935 Arlyss Queen, RN BSN CM Pt discharged home today with [facility] PT. Alroy Bailiff of [facility] is aware and will collect the pts information from the chart. Rolling walker will be delivered by [facility] to pts room prior to discharge and CPM and 3N1 will be delivered to pts home after discharge today. Hh services to start within 48 hours. No other CM needs noted. Pt and pts nurse aware of discharge arrangements.  04/18/13 1540 Arlyss Queen, RN BSN Cm

## 2013-04-18 NOTE — Progress Notes (Signed)
Physical Therapy Treatment Patient Details Name: [name] MRN: [id number] DOB: 03/21/1961 Today's Date: 04/18/2013 Time: 7829-5621 PT Time Calculation (min): 47 min  PT Assessment / Plan / Recommendation  History of Present Illness     PT Comments   Pt is progressing extremely well.  Her pain is well controlled and she is now ambulating in the room with a walker and family assist.  Her right knee ROM is -12-86 degrees AA and she is now able to do a SAQ independently.  She has been given written HEP as well as other literature re: TKR.  In my opinion, since PT is so limited under Medicaid Services (10 visits/year), I would recommend only 1-2 HHPT visits and then progress directly to OP PT.  She states that she would have transportation to the hospital and she lives close by.  Follow Up Recommendations  Home health PT;Outpatient PT     Does the patient have the potential to tolerate intense rehabilitation     Barriers to Discharge        Equipment Recommendations       Recommendations for Other Services    Frequency     Progress towards PT Goals Progress towards PT goals: Progressing toward goals  Plan Current plan remains appropriate    Precautions / Restrictions     Pertinent Vitals/Pain     Mobility  Bed Mobility Supine to Sit: 5: Supervision;HOB flat Details for Bed Mobility Assistance: Pt was instructed in how to use the LLE to assist the RLE into the bed...she was successful with this Transfers Sit to Stand: 5: Supervision;From chair/3-in-1;With upper extremity assist Stand to Sit: 5: Supervision;With upper extremity assist;To bed Ambulation/Gait Ambulation/Gait Assistance: 5: Supervision Ambulation Distance (Feet): 50 Feet Assistive device: Rolling walker Gait Pattern: Decreased hip/knee flexion - right Stairs: No Wheelchair Mobility Wheelchair Mobility: No    Exercises Total Joint Exercises Ankle Circles/Pumps: AROM;Both;10 reps;Supine Quad Sets:  AROM;Both;10 reps;Supine Short Arc Quad: AROM;AAROM;Right;10 reps;Supine Heel Slides: AAROM;Right;10 reps;Supine   PT Diagnosis:    PT Problem List:   PT Treatment Interventions:     PT Goals (current goals can now be found in the care plan section)    Visit Information  Last PT Received On: 04/18/13    Subjective Data      Cognition       Balance     End of Session PT - End of Session Equipment Utilized During Treatment: Gait belt Activity Tolerance: Patient tolerated treatment well Patient left: in bed;in CPM;with call bell/phone within reach;with bed alarm set;with family/visitor present Nurse Communication: Mobility status CPM Right Knee CPM Right Knee: On Right Knee Flexion (Degrees): 60 Right Knee Extension (Degrees): 0   GP     Myrlene Broker L 04/18/2013, 2:55 PM

## 2013-04-18 NOTE — Evaluation (Addendum)
Physical Therapy Evaluation Patient Details Name: [name] MRN: [id number] DOB: 01/22/1961 Today's Date: 04/18/2013 Time: 4401-0272 PT Time Calculation (min): 41 min  PT Assessment / Plan / Recommendation History of Present Illness  Pt is seen for an elective R TKR.  She lives alone and has been ambulating with a cane.  She has excellent family support and her sister plans to stay with her at d/c.  Clinical Impression   Pt was seen for initial evaluation/treatment and tolerated very well.  Sister present to observe in order to assist pt at home.  She was instructed in TKR precautions and protocol.  Therapeutic exercise was initiated and pt had -15-78 degrees AA ROM of the right knee.  She transferred OOB with minimal to SBA and was able to ambulate in the room with a stable gait pattern using a walker.  Her pain was well controlled.  Her TED stockings do not fit well so will need to be refitted.   We will follow per protocol.  Unfortunately, Medicaid does not cover more than 10 outpatient PT visits per year, so her sister plans to follow very closely and assist pt at home.     PT Assessment       Follow Up Recommendations  Home health PT    Does the patient have the potential to tolerate intense rehabilitation      Barriers to Discharge        Equipment Recommendations  Rolling walker with 5" wheels;3in1 (PT)    Recommendations for Other Services     Frequency      Precautions / Restrictions Precautions Precautions: Knee Precaution Booklet Issued: No Precaution Comments: pt already has a copy of TKR booklet Required Braces or Orthoses: Knee Immobilizer - Right Knee Immobilizer - Right: On except when in CPM Restrictions Weight Bearing Restrictions: No   Pertinent Vitals/Pain       Mobility  Bed Mobility Bed Mobility: Supine to Sit Supine to Sit: 5: Supervision;HOB elevated Transfers Transfers: Sit to Stand;Stand to Sit Sit to Stand: 4: Min guard;From  bed;With upper extremity assist Stand to Sit: 5: Supervision;To chair/3-in-1;With upper extremity assist Ambulation/Gait Ambulation/Gait Assistance: 4: Min guard Ambulation Distance (Feet): 30 Feet Assistive device: Rolling walker Gait Pattern: Decreased step length - left;Decreased hip/knee flexion - right;Trunk flexed Gait velocity: WNL Stairs: No Wheelchair Mobility Wheelchair Mobility: No    Exercises Total Joint Exercises Ankle Circles/Pumps: AROM;Both;10 reps;Supine Quad Sets: AROM;Both;10 reps;Supine Short Arc Quad: AAROM;Right;10 reps;Supine Heel Slides: AAROM;Right;10 reps;Supine   PT Diagnosis:    PT Problem List:   PT Treatment Interventions:       PT Goals(Current goals can be found in the care plan section) Acute Rehab PT Goals Patient Stated Goal: return home to independence PT Goal Formulation: With patient/family Time For Goal Achievement: 05/02/13 Potential to Achieve Goals: Good  Visit Information  Last PT Received On: 04/18/13 History of Present Illness: Pt is seen for an elective R TKR.  She lives alone and has been ambulating with a cane.  She has excellent family support and her sister plans to stay with her at d/c.       Prior Functioning  Home Living Family/patient expects to be discharged to:: Private residence Living Arrangements: Alone Available Help at Discharge: Family (sister will stay with her ) Type of Home: House Home Access: Stairs to enter [employer] of Steps: 3 Entrance Stairs-Rails: Left;Right;Can reach both Home Layout: One level Home Equipment: Crutches;Walker - 2 wheels Prior Function Level  of Independence: Independent with assistive device(s) Communication Communication: No difficulties Dominant Hand: Right    Cognition  Cognition Arousal/Alertness: Awake/alert Behavior During Therapy: WFL for tasks assessed/performed Overall Cognitive Status: Within Functional Limits for tasks assessed    Extremity/Trunk  Assessment Upper Extremity Assessment Upper Extremity Assessment: Overall WFL for tasks assessed Lower Extremity Assessment Lower Extremity Assessment: RLE deficits/detail RLE Deficits / Details: ROM of knee =-15-78 degree, AA-- expected weakness in quadriceps following surgery   Balance Balance Balance Assessed: Yes (WNL by functional observation)  End of Session PT - End of Session Equipment Utilized During Treatment: Gait belt Activity Tolerance: Patient tolerated treatment well Patient left: with call bell/phone within reach;in chair Nurse Communication: Mobility status  GP     Myrlene Broker L 04/18/2013, 10:09 AM

## 2013-04-18 NOTE — Clinical Social Work Note (Signed)
CSW received referral for possible SNF. Pt evaluated by PT and recommendation is for home health. She plans to have help from her sister at d/c. CSW will sign off but can be reconsulted if needed.  Derenda Fennel, [place] [phone]

## 2013-04-18 NOTE — Evaluation (Signed)
Occupational Therapy Evaluation Patient Details Name: [name] MRN: [id number] DOB: 01-11-1961 Today's Date: 04/18/2013 Time: 9629-5284 OT Time Calculation (min): 33 min  OT Assessment / Plan / Recommendation History of present illness Pt is seen for an elective R TKR.  She lives alone and has been ambulating with a cane.  She has excellent family support and her sister plans to stay with her at d/c.   Clinical Impression   Patient is a 52 y/o female s/p Right TKA presenting to acute OT with all education complete. Patient was educated on AE available to assist with BADL performance. Patient's sister will be staying with patient and assisting with BADL until she is able to get around by herself. No further acute OT needs at this time.    OT Assessment  Patient does not need any further OT services    Follow Up Recommendations  No OT follow up    Barriers to Discharge  None    Equipment Recommendations  None recommended by OT          Precautions / Restrictions Precautions Precautions: Knee Precaution Booklet Issued: No Precaution Comments: pt already has a copy of TKR booklet Required Braces or Orthoses: Knee Immobilizer - Right Knee Immobilizer - Right: On except when in CPM Restrictions Weight Bearing Restrictions: No   Pertinent Vitals/Pain Pain in right knee with movement    ADL  Lower Body Dressing: Performed;Maximal assistance Where Assessed - Lower Body Dressing: Supine, head of bed up Toilet Transfer: Performed;Minimal assistance Toilet Transfer Method: Stand pivot Toilet Transfer Equipment:  (to recliner)      OT Goals(Current goals can be found in the care plan section) 1 time visit.  Visit Information  History of Present Illness: Pt is seen for an elective R TKR.  She lives alone and has been ambulating with a cane.  She has excellent family support and her sister plans to stay with her at d/c.       Prior Functioning     Home  Living Family/patient expects to be discharged to:: Private residence Living Arrangements: Alone Available Help at Discharge: Family (sister will stay with her ) Type of Home: House Home Access: Stairs to enter Secretary/administrator of Steps: 3 Entrance Stairs-Rails: Left;Right;Can reach both Home Layout: One level Home Equipment: Crutches;Walker - 2 wheels Prior Function Level of Independence: Independent with assistive device(s) Communication Communication: No difficulties Dominant Hand: Right         Vision/Perception Vision - History Baseline Vision: No visual deficits Patient Visual Report: No change from baseline   Cognition  Cognition Arousal/Alertness: Awake/alert Behavior During Therapy: WFL for tasks assessed/performed Overall Cognitive Status: Within Functional Limits for tasks assessed    Extremity/Trunk Assessment Upper Extremity Assessment Upper Extremity Assessment: Overall WFL for tasks assessed Lower Extremity Assessment Lower Extremity Assessment: RLE deficits/detail RLE Deficits / Details: ROM of knee =-15-78 degree, AA-- expected weakness in quadriceps following surgery     Mobility Bed Mobility Bed Mobility: Supine to Sit Supine to Sit: 5: Supervision;HOB elevated Transfers Sit to Stand: 4: Min guard;From bed;With upper extremity assist Stand to Sit: 5: Supervision;To chair/3-in-1;With upper extremity assist        Balance Balance Balance Assessed: Yes (WNL by functional observation)   End of Session OT - End of Session Equipment Utilized During Treatment: Gait belt;Rolling walker Activity Tolerance: Patient tolerated treatment well Patient left: in chair;with call bell/phone within reach;with family/visitor present    Limmie Patricia, OTR/L,CBIS   04/18/2013, 11:30  AM

## 2013-04-19 LAB — TYPE AND SCREEN
ABO/RH(D): O POS
Antibody Screen: NEGATIVE
Unit division: 0
Unit division: 0

## 2013-04-19 LAB — CBC
MCH: 27.5 pg (ref 26.0–34.0)
MCV: 82.5 fL (ref 78.0–100.0)
Platelets: 225 10*3/uL (ref 150–400)
RBC: 3.6 MIL/uL — ABNORMAL LOW (ref 3.87–5.11)

## 2013-04-19 MED ORDER — OXYCODONE-ACETAMINOPHEN 5-325 MG PO TABS
1.0000 | ORAL_TABLET | ORAL | Status: DC
  Administered 2013-04-19 – 2013-04-20 (×7): 1 via ORAL
  Filled 2013-04-19 (×7): qty 1

## 2013-04-19 MED ORDER — OXYCODONE HCL 5 MG PO TABS
5.0000 mg | ORAL_TABLET | ORAL | Status: DC
  Administered 2013-04-19 – 2013-04-20 (×7): 5 mg via ORAL
  Filled 2013-04-19 (×7): qty 1

## 2013-04-19 NOTE — Anesthesia Postprocedure Evaluation (Signed)
  Anesthesia Post-op Note  Patient: [name]  Procedure(s) Performed: Procedure(s): RIGHT TOTAL KNEE ARTHROPLASTY (Right)  Patient Location: Room 221  Anesthesia Type:Spinal  Level of Consciousness: awake, alert , oriented and patient cooperative  Airway and Oxygen Therapy: Patient Spontanous Breathing  Post-op Pain: mild  Post-op Assessment: Post-op Vital signs reviewed, Patient's Cardiovascular Status Stable, Respiratory Function Stable, Patent Airway, No signs of Nausea or vomiting and Pain level controlled  Post-op Vital Signs: Reviewed and stable  Complications: No apparent anesthesia complications

## 2013-04-19 NOTE — Progress Notes (Addendum)
Physical Therapy Treatment Patient Details Name: [name] MRN: [id number] DOB: 04/12/1961 Today's Date: 04/19/2013 Time: 1125-1210 PT Time Calculation (min): 45 min  PT Assessment / Plan / Recommendation  PT Comments   Pt continues to tolerate therapy well. Pt able to ambulate increased distance with gait training. Pt able toascend and descend 3 stairs with CGA and 1 handrail. Sister is present for session and she will be assisting pt after discharge. Pt is without complaint throughout session. Pt is progressing extremely well.  Plan Current plan remains appropriate    Precautions / Restrictions Restrictions Weight Bearing Restrictions: No   Pertinent Vitals/Pain 0/10    Mobility  Bed Mobility Bed Mobility: Supine to Sit Supine to Sit: 5: Supervision;HOB elevated Transfers Transfers: Sit to Stand;Stand to Sit Sit to Stand: 5: Supervision;With upper extremity assist;From chair/3-in-1 Stand to Sit: 5: Supervision;With upper extremity assist;To bed Ambulation/Gait Ambulation/Gait Assistance: 5: Supervision Ambulation Distance (Feet): 140 Feet Assistive device: Rolling walker Gait Pattern: Decreased hip/knee flexion - right Gait velocity: WNL General Gait Details: Pt requires VCs to avoid looking down  Stair training: 3 stairs completed 3 times with no AD; Twice with 2 HR and once with 1 HR    Exercises Total Joint Exercises Ankle Circles/Pumps: AROM;Both;10 reps;Supine Quad Sets: AROM;Both;10 reps;Supine Short Arc Quad: AROM;AAROM;Right;10 reps;Supine Heel Slides: AAROM;Right;Supine;5 reps Goniometric ROM: 8-91    Visit Information  Last PT Received On: 04/19/13    Subjective Data  Pt states that she is feeling well and ready to complete stair training.  End of Session PT - End of Session Equipment Utilized During Treatment: Gait belt Activity Tolerance: Patient tolerated treatment well Patient left: in bed;with call bell/phone within reach CPM Right  Knee CPM Right Knee: Off Right Knee Flexion (Degrees): 70 Right Knee Extension (Degrees): 0  Seth Bake, PTA 04/19/2013, 12:26 PM

## 2013-04-19 NOTE — Progress Notes (Signed)
Subjective: 2 Days Post-Op Procedure(s) (LRB): RIGHT TOTAL KNEE ARTHROPLASTY (Right) Patient reports pain as 5 on 0-10 scale.    Objective: Vital signs in last 24 hours: Temp:  [98.1 F (36.7 C)-98.3 F (36.8 C)] 98.3 F (36.8 C) (08/20 0500) Pulse Rate:  [96-103] 100 (08/20 0500) Resp:  [20] 20 (08/20 0500) BP: (120-149)/(74-87) 133/84 mmHg (08/20 0500) SpO2:  [94 %-97 %] 94 % (08/20 0500)  Intake/Output from previous day: 08/19 0701 - 08/20 0700 In: 4240 [P.O.:840; I.V.:3400] Out: 100 [Drains:100] Intake/Output this shift:     Recent Labs  04/18/13 0450 04/19/13 0515  HGB 11.1* 9.9*    Recent Labs  04/18/13 0450 04/19/13 0515  WBC 10.2 11.0*  RBC 4.08 3.60*  HCT 33.7* 29.7*  PLT 265 225    Recent Labs  04/18/13 0450  NA 136  K 3.9  CL 102  CO2 26  BUN 7  CREATININE 0.70  GLUCOSE 107*  CALCIUM 8.8   No results found for this basename: LABPT, INR,  in the last 72 hours  Neurologically intact ABD soft Neurovascular intact Sensation intact distally Intact pulses distally Dorsiflexion/Plantar flexion intact Incision: scant drainage Compartment soft  Assessment/Plan: 2 Days Post-Op Procedure(s) (LRB): RIGHT TOTAL KNEE ARTHROPLASTY (Right) Up with therapy Plan for discharge tomorrow Discharge home with home health  Fuller Canada 04/19/2013, 8:15 AM

## 2013-04-19 NOTE — Progress Notes (Signed)
Physical Therapy Treatment Patient Details Name: [name] MRN: [id number] DOB: 1960/09/03 Today's Date: 04/19/2013 Time: 4098-1191 PT Time Calculation (min): 24 min  PT Assessment / Plan / Recommendation     PT Comments   Pt continues to progress extremely well. Pt completes therex and gait training with minimal difficulty. Pt is safe with transfers. ROM has improved 3 degrees in flexion and extension. Plan to complete stair training next session as pt has 3 stairs to enter home.     Plan Current plan remains appropriate       Pertinent Vitals/Pain 0/10 pain at rest; 5/10 pain in right knee at end of session; RN made aware and pt was medicated    Mobility  Bed Mobility Bed Mobility: Supine to Sit Supine to Sit: 5: Supervision;HOB elevated Transfers Transfers: Sit to Stand;Stand to Sit Sit to Stand: 5: Supervision;With upper extremity assist;From bed Stand to Sit: 5: Supervision;With upper extremity assist;To bed Ambulation/Gait Ambulation/Gait Assistance: 5: Supervision Ambulation Distance (Feet): 100 Feet Assistive device: Rolling walker Gait Pattern: Decreased hip/knee flexion - right Gait velocity: WNL General Gait Details: Pt requires VCs to avoid looking down    Exercises Total Joint Exercises Ankle Circles/Pumps: AROM;Both;10 reps;Supine Quad Sets: AROM;Both;10 reps;Supine Short Arc Quad: AROM;AAROM;Right;10 reps;Supine Heel Slides: AAROM;Right;10 reps;Supine Goniometric ROM: 9-89 AROM      Visit Information  Last PT Received On: 04/19/13    Subjective Data  Pt states that she is feeling well.  End of Session PT - End of Session Equipment Utilized During Treatment: Gait belt Activity Tolerance: Patient tolerated treatment well Patient left: in bed;with call bell/phone within reach CPM Right Knee CPM Right Knee: Off   Seth Bake, PTA  04/19/2013, 8:59 AM

## 2013-04-19 NOTE — Plan of Care (Signed)
Problem: Consults Goal: Diagnosis- Total Joint Replacement Primary Total Knee     

## 2013-04-20 ENCOUNTER — Telehealth: Payer: Self-pay | Admitting: Orthopedic Surgery

## 2013-04-20 LAB — CBC
HCT: 29.5 % — ABNORMAL LOW (ref 36.0–46.0)
MCH: 27 pg (ref 26.0–34.0)
MCHC: 32.5 g/dL (ref 30.0–36.0)
MCV: 83.1 fL (ref 78.0–100.0)
RDW: 15.3 % (ref 11.5–15.5)

## 2013-04-20 MED ORDER — ASPIRIN 325 MG PO TBEC: 325.0000 mg | DELAYED_RELEASE_TABLET | Freq: Two times a day (BID) | ORAL | Status: DC

## 2013-04-20 MED ORDER — OXYCODONE-ACETAMINOPHEN 5-325 MG PO TABS: 1.0000 | ORAL_TABLET | ORAL | Status: DC | PRN

## 2013-04-20 NOTE — Progress Notes (Signed)
UR chart review completed.  

## 2013-04-20 NOTE — Telephone Encounter (Signed)
[name] says she needs order for potty chair and CPM machine faxed to [facility]

## 2013-04-20 NOTE — Plan of Care (Signed)
Problem: Discharge Progression Outcomes Goal: Incision without S/S infection Outcome: Completed/Met Date Met:  04/20/13 Dressing changed per MD this morning, clean, dry and intact.

## 2013-04-20 NOTE — Discharge Summary (Signed)
Physician Discharge Summary  Patient ID: [name] MRN: [id number] DOB/AGE: 1961/03/07 52 y.o.  Admit date: 04/17/2013 Discharge date: 04/20/2013  Admission Diagnoses:  Discharge Diagnoses:  Active Problems:   * No active hospital problems. *   Discharged Condition: good  Hospital Course: surgery Monday no complications. PT did well. Discharge Thurs.   Consults: None  Significant Diagnostic Studies: labs:  Hemoglobin & Hematocrit     Component Value Date/Time   HGB 9.6* 04/20/2013 0501   HCT 29.5* 04/20/2013 0501     Treatments: surgery: right tka depuy 2.60F 3 T 10 poly 38 P   Discharge Exam: Blood pressure 115/78, pulse 108, temperature 98.1 F (36.7 C), temperature source Oral, resp. rate 20, height 5\' 2"  (1.575 m), weight 228 lb (103.42 kg), SpO2 97.00%. General appearance: alert, cooperative and appears stated age Incision/Wound:clean   Disposition: 01-Home or Self Care  Discharge Orders   Future Appointments Provider Department Dept Phone   05/02/2013 11:45 AM Vickki Hearing, MD [facility] [phone]   Future Orders Complete By Expires   Call MD / Call 911  As directed    Comments:     If you experience chest pain or shortness of breath, CALL 911 and be transported to the hospital emergency room.  If you develope a fever above 101 F, pus (white drainage) or increased drainage or redness at the wound, or calf pain, call your surgeon's office.   Change dressing  As directed    Comments:     Change  dressing daily with sterile 4 x 4 inch gauze dressing and apply TED hose.  You may clean the incision with alcohol prior to redressing.   Constipation Prevention  As directed    Comments:     Drink plenty of fluids.  Prune juice may be helpful.  You may use a stool softener, such as Colace (over the counter) 100 mg twice a day.  Use MiraLax (over the counter) for constipation as needed.   CPM  As directed    Comments:     Continuous passive motion machine (CPM):      Use the CPM from 0 to 80 for 6 hours per day.      You may increase by 10 per day.  You may break it up into 2 or 3 sessions per day.      Use CPM for 2 weeks or until you are told to stop.   Diet - low sodium heart healthy  As directed    Do not put a pillow under the knee. Place it under the heel.  As directed    Driving restrictions  As directed    Comments:     No driving for 4 weeks   For home use only DME Continuous passive motion machine  As directed    Increase activity slowly as tolerated  As directed    TED hose  As directed    Comments:     Use stockings (TED hose) for 4 weeks on both leg(s).  You may remove them at night for sleeping.       Medication List    STOP taking these medications       BC HEADACHE POWDER PO     ibuprofen 200 MG tablet  Commonly known as:  ADVIL,MOTRIN     traMADol 50 MG tablet  Commonly known as:  ULTRAM      TAKE these medications  aspirin 325 MG EC tablet  Take 1 tablet (325 mg total) by mouth 2 (two) times daily.     clobetasol cream 0.05 %  Commonly known as:  TEMOVATE  Apply 1 application topically 2 (two) times daily as needed (rash).     cyclobenzaprine 10 MG tablet  Commonly known as:  FLEXERIL  Take 10 mg by mouth 4 (four) times daily as needed for muscle spasms.     megestrol 40 MG tablet  Commonly known as:  MEGACE  Take 1 tablet (40 mg total) by mouth daily.     oxyCODONE-acetaminophen 5-325 MG per tablet  Commonly known as:  PERCOCET/ROXICET  Take 1 tablet by mouth every 4 (four) hours as needed for pain.     travoprost (benzalkonium) 0.004 % ophthalmic solution  Commonly known as:  TRAVATAN  Place 1 drop into both eyes daily.     zolpidem 10 MG tablet  Commonly known as:  AMBIEN  Take 10 mg by mouth at bedtime as needed for sleep.           Follow-up Information   Follow up with Fuller Canada, MD On 05/02/2013.   Specialties:  Orthopedic Surgery,  Radiology   Contact information:   [address], [address], [place] [address] [phone]      Cherlynn Polo out in office  4 weeks teds and aspirin  Signed: Fuller Canada 04/20/2013, 7:38 AM

## 2013-04-20 NOTE — Progress Notes (Signed)
04/20/13 1108 Late entry for 0955. Reviewed discharge instructions with patient and sister at bedside. Given copy of instructions, medication list, prescriptions, and f/u appointments. Home health for RN and PT set up with [facility], pt has walker at bedside for home use. Home health to deliver CPM and BSC to home, pt aware. Reviewed total knee replacement education sheet and when to call MD via teachback. Pt verbalizes understanding. IV site d/c'd this morning, site within normal limits. Pt stable awaiting ride for transport home. Earnstine Regal, RN

## 2013-04-20 NOTE — Telephone Encounter (Signed)
?   This should have been done by Arlyss Queen at [facility] bu tIll do another one

## 2013-04-20 NOTE — Progress Notes (Signed)
Pt is stable, family member here to transport pt home.  Pt taken to main entrance in wheelchair by staff member.

## 2013-04-21 ENCOUNTER — Encounter (HOSPITAL_COMMUNITY): Payer: Self-pay | Admitting: Orthopedic Surgery

## 2013-04-25 NOTE — Telephone Encounter (Signed)
This order was faxed to [facility] 04/21/13

## 2013-04-26 ENCOUNTER — Telehealth: Payer: Self-pay | Admitting: Orthopedic Surgery

## 2013-04-26 ENCOUNTER — Emergency Department (HOSPITAL_COMMUNITY): Payer: Medicaid Other

## 2013-04-26 ENCOUNTER — Emergency Department (HOSPITAL_COMMUNITY)
Admission: EM | Admit: 2013-04-26 | Discharge: 2013-04-26 | Disposition: A | Discharge status: Home / Self Care | Payer: Medicaid Other | Attending: Emergency Medicine | Admitting: Emergency Medicine

## 2013-04-26 ENCOUNTER — Encounter (HOSPITAL_COMMUNITY): Payer: Self-pay

## 2013-04-26 DIAGNOSIS — Z87891 Personal history of nicotine dependence: Secondary | ICD-10-CM | POA: Insufficient documentation

## 2013-04-26 DIAGNOSIS — Y9289 Other specified places as the place of occurrence of the external cause: Secondary | ICD-10-CM | POA: Insufficient documentation

## 2013-04-26 DIAGNOSIS — Y9389 Activity, other specified: Secondary | ICD-10-CM | POA: Insufficient documentation

## 2013-04-26 DIAGNOSIS — Z79899 Other long term (current) drug therapy: Secondary | ICD-10-CM | POA: Insufficient documentation

## 2013-04-26 DIAGNOSIS — X500XXA Overexertion from strenuous movement or load, initial encounter: Secondary | ICD-10-CM | POA: Insufficient documentation

## 2013-04-26 DIAGNOSIS — M129 Arthropathy, unspecified: Secondary | ICD-10-CM | POA: Insufficient documentation

## 2013-04-26 DIAGNOSIS — Z96659 Presence of unspecified artificial knee joint: Secondary | ICD-10-CM | POA: Insufficient documentation

## 2013-04-26 DIAGNOSIS — Z7982 Long term (current) use of aspirin: Secondary | ICD-10-CM | POA: Insufficient documentation

## 2013-04-26 DIAGNOSIS — Z872 Personal history of diseases of the skin and subcutaneous tissue: Secondary | ICD-10-CM | POA: Insufficient documentation

## 2013-04-26 DIAGNOSIS — S8990XA Unspecified injury of unspecified lower leg, initial encounter: Secondary | ICD-10-CM | POA: Insufficient documentation

## 2013-04-26 DIAGNOSIS — R609 Edema, unspecified: Secondary | ICD-10-CM | POA: Insufficient documentation

## 2013-04-26 DIAGNOSIS — IMO0002 Reserved for concepts with insufficient information to code with codable children: Secondary | ICD-10-CM | POA: Insufficient documentation

## 2013-04-26 DIAGNOSIS — M25561 Pain in right knee: Secondary | ICD-10-CM

## 2013-04-26 DIAGNOSIS — Z8739 Personal history of other diseases of the musculoskeletal system and connective tissue: Secondary | ICD-10-CM | POA: Insufficient documentation

## 2013-04-26 NOTE — Telephone Encounter (Signed)
Weston Brass, [facility] physical therapist, called to relay that he visited patient today, 04/26/13, for 2nd home therapy visit.  States she had relayed to him that she had a fall last night, denied pain, said she was weightbearing, and had no complaints.  He then said "it appears her knee implant may have come loose", and states that EMS was arriving and transporting her to [facility].  His contact ph# is [phone].

## 2013-04-26 NOTE — ED Notes (Signed)
nad noted prior to dc. Dc instructions reviewed and explained along with f/u and home care. Pt voiced understanding.

## 2013-04-26 NOTE — ED Provider Notes (Addendum)
CSN: [id number]     Arrival date & time 04/26/13  1156 History  This chart was scribed for Donnetta Hutching, MD by Blanchard Kelch, ED Scribe. The patient was seen in room APA14/[facility]14. Patient's care was started at 12:54 PM.    Chief Complaint  Patient presents with  . Knee Pain   (Consider location/radiation/quality/duration/timing/severity/associated sxs/prior Treatment) Patient is a 52 y.o. female presenting with knee pain. The history is provided by the patient. No language interpreter was used.  Knee Pain   HPI Comments: [name] is a 52 y.o. female who presents to the Emergency Department complaining of sharp right knee pain that occurred while she was doing squats in therapy and felt something "pop". Patient had total knee replacement August 18 by Dr. Romeo Apple.  Patient complains of increased soreness in knee. Patient denies an increase in baseline swelling of the knee.  Positioning and palpation makes pain worse. Severity is moderate.  Past Medical History  Diagnosis Date  . Eczema   . Muscle spasm of back   . Shortness of breath     with exertion   . Arthritis    Past Surgical History  Procedure Laterality Date  . No past surgeries    . Lipoma excision  11/16/2011    Procedure: EXCISION LIPOMA;  Surgeon: Marlane Hatcher, MD;  Location: [facility] ORS;  Service: General;  Laterality: Left;  Excision of neoplasm left shoulder  . Tubal ligation      and burned per patient.   . Multiple extractions with alveoloplasty  12/28/2011    Procedure: MULTIPLE EXTRACION WITH ALVEOLOPLASTY;  Surgeon: Georgia Lopes, DDS;  Location: [facility] OR;  Service: Oral Surgery;  Laterality: Bilateral;  . Mouth surgery    . Shoulder surgery    . Total knee arthroplasty Right 04/17/2013    Procedure: RIGHT TOTAL KNEE ARTHROPLASTY;  Surgeon: Vickki Hearing, MD;  Location: [facility] ORS;  Service: Orthopedics;  Laterality: Right;  . Joint replacement      r knee   Family History  Problem Relation Age of Onset   . Arthritis    . Anesthesia problems Neg Hx   . Hypotension Neg Hx   . Malignant hyperthermia Neg Hx   . Pseudochol deficiency Neg Hx   . Diabetes Brother    History  Substance Use Topics  . Smoking status: Former Smoker -- 0.10 packs/day for 1 years    Types: Cigarettes  . Smokeless tobacco: Never Used  . Alcohol Use: No   OB History   Grav Para Term Preterm Abortions TAB SAB Ect Mult Living   5 5 5      1 6      Review of Systems: A complete 10 system review of systems was obtained and all systems are negative except as noted in the HPI and PMH.    Allergies  Chocolate  Home Medications   Current Outpatient Rx  Name  Route  Sig  Dispense  Refill  . aspirin EC 325 MG EC tablet   Oral   Take 1 tablet (325 mg total) by mouth 2 (two) times daily.   60 tablet   0   . clobetasol cream (TEMOVATE) 0.05 %   Topical   Apply 1 application topically 2 (two) times daily as needed (rash).         . cyclobenzaprine (FLEXERIL) 10 MG tablet   Oral   Take 10 mg by mouth 4 (four) times daily as needed for muscle spasms.         Marland Kitchen  megestrol (MEGACE) 40 MG tablet   Oral   Take 1 tablet (40 mg total) by mouth daily.   30 tablet   11   . oxyCODONE-acetaminophen (PERCOCET/ROXICET) 5-325 MG per tablet   Oral   Take 1 tablet by mouth every 4 (four) hours as needed for pain.   90 tablet   0   . travoprost, benzalkonium, (TRAVATAN) 0.004 % ophthalmic solution   Both Eyes   Place 1 drop into both eyes daily.          Marland Kitchen zolpidem (AMBIEN) 10 MG tablet   Oral   Take 10 mg by mouth at bedtime as needed for sleep.          Triage Vitals: BP 162/102  Pulse 116  Temp(Src) 98.9 F (37.2 C) (Oral)  Resp 19  Ht 5\' 2"  (1.575 m)  Wt 280 lb (127.007 kg)  BMI 51.2 kg/m2  SpO2 94%  Physical Exam  Nursing note and vitals reviewed. Constitutional: She is oriented to person, place, and time. She appears well-developed and well-nourished.  HENT:  Head: Normocephalic and  atraumatic.  Eyes: Conjunctivae and EOM are normal. Pupils are equal, round, and reactive to light.  Neck: Normal range of motion. Neck supple.  Pulmonary/Chest: Effort normal.  Musculoskeletal: Normal range of motion.  Right knee edema and tenderness. Flexion to 140 degrees.  Neurological: She is alert and oriented to person, place, and time.  Skin: Skin is warm and dry.  Psychiatric: She has a normal mood and affect.    ED Course  Procedures (including critical care time)  DIAGNOSTIC STUDIES: Oxygen Saturation is 94% on room air , adequate by my interpretation.    COORDINATION OF CARE:  12:57 PM - Patient verbalizes understanding and agrees with treatment plan.     Labs Review Labs Reviewed - No data to display Imaging Review Dg Knee Complete 4 Views Right  04/26/2013   *RADIOLOGY REPORT*  Clinical Data: Recent right knee surgery.  Popping during physical therapy.  Pain.  RIGHT KNEE - COMPLETE 4+ VIEW  Comparison: 04/17/2013.  Findings: Right knee arthroplasty.  Probable joint fluid with overlying soft tissue edema, related to postoperative state. Surgical skin staples remain in place.  Drain has been removed.  IMPRESSION: Right knee arthroplasty with expected postoperative findings.   Original Report Authenticated By: Leanna Battles, M.D.    MDM  No diagnosis found. Knee exam looks normal postoperatively.   No obvious injury plain films.   Patient has local orthopedic followup  I personally performed the services described in this documentation, which was scribed in my presence. The recorded information has been reviewed and is accurate.    Donnetta Hutching, MD 04/26/13 1321  Donnetta Hutching, MD 04/26/13 1416

## 2013-04-26 NOTE — ED Notes (Signed)
Pt reports had R knee replacement Aug 18th and was having physical therapy at home.  Reports was doing squats and felt something pop in R knee.  Reports pain lasted approx 5 min.  Denies pain now but wanted knee evaluated.

## 2013-05-02 ENCOUNTER — Ambulatory Visit (INDEPENDENT_AMBULATORY_CARE_PROVIDER_SITE_OTHER): Payer: Medicaid Other | Admitting: Orthopedic Surgery

## 2013-05-02 ENCOUNTER — Encounter (HOSPITAL_COMMUNITY): Payer: Self-pay | Admitting: Pharmacy Technician

## 2013-05-02 ENCOUNTER — Telehealth: Payer: Self-pay | Admitting: Orthopedic Surgery

## 2013-05-02 VITALS — BP 164/102 | Ht 62.0 in | Wt 280.0 lb

## 2013-05-02 DIAGNOSIS — S86811A Strain of other muscle(s) and tendon(s) at lower leg level, right leg, initial encounter: Secondary | ICD-10-CM

## 2013-05-02 NOTE — Progress Notes (Signed)
Patient ID: [name], female   DOB: 1961-04-20, 52 y.o.   MRN: [id number] Chief Complaint  Patient presents with  . 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

## 2013-05-02 NOTE — Patient Instructions (Addendum)
Surgery right knee 16109 on sept 12

## 2013-05-02 NOTE — Telephone Encounter (Signed)
FYI-Nick/[facility] left a message on Friday, 04/28/13) concerning [name].  Said he sent her to [facility] for an xray of her knee to evaluate for possible failure of implant.  Xray was negative, but he is very concerned , said she has regressed significantly, has instability of the knee, and very painful.   Nick's # [phone]

## 2013-05-03 ENCOUNTER — Telehealth: Payer: Self-pay | Admitting: Orthopedic Surgery

## 2013-05-03 NOTE — Telephone Encounter (Signed)
Contact to insurer, Medicaid's on-line system (had also tried ph# [phone])  [place] [employer], regarding surgery scheduled at [facility] 05/12/13, in-patient/admit, CPT [phone], ICD9 844.8; n/a per phone; per on-line prior approval, no prior authorization required for codes noted.

## 2013-05-04 ENCOUNTER — Telehealth: Payer: Self-pay | Admitting: Orthopedic Surgery

## 2013-05-04 ENCOUNTER — Ambulatory Visit (INDEPENDENT_AMBULATORY_CARE_PROVIDER_SITE_OTHER): Payer: Medicaid Other | Admitting: Orthopedic Surgery

## 2013-05-04 DIAGNOSIS — Z5189 Encounter for other specified aftercare: Secondary | ICD-10-CM

## 2013-05-04 DIAGNOSIS — S86811D Strain of other muscle(s) and tendon(s) at lower leg level, right leg, subsequent encounter: Secondary | ICD-10-CM

## 2013-05-04 NOTE — Telephone Encounter (Signed)
Patient called, said unable to come, needs help,dressing change for knee - has surgery scheduled 05/12/13 - said thought that Dr. Romeo Apple was coming to home? Home health care?  Offered appointment, said has no ride.  Please advise. Ph# [phone]

## 2013-05-04 NOTE — Progress Notes (Signed)
Patient ID: [name], female   DOB: 1960-11-29, 52 y.o.   MRN: [id number]  Dressing change

## 2013-05-04 NOTE — Telephone Encounter (Signed)
Patient's dressing was changed in the office today.

## 2013-05-08 ENCOUNTER — Encounter (HOSPITAL_COMMUNITY)
Admission: RE | Admit: 2013-05-08 | Discharge: 2013-05-08 | Disposition: A | Discharge status: Home / Self Care | Payer: Medicaid Other | Source: Ambulatory Visit | Attending: Orthopedic Surgery | Admitting: Orthopedic Surgery

## 2013-05-08 ENCOUNTER — Ambulatory Visit (INDEPENDENT_AMBULATORY_CARE_PROVIDER_SITE_OTHER): Payer: Medicaid Other | Admitting: Orthopedic Surgery

## 2013-05-08 ENCOUNTER — Encounter (HOSPITAL_COMMUNITY): Payer: Self-pay

## 2013-05-08 VITALS — BP 164/101 | Ht 62.0 in | Wt 280.0 lb

## 2013-05-08 DIAGNOSIS — Z96651 Presence of right artificial knee joint: Secondary | ICD-10-CM

## 2013-05-08 DIAGNOSIS — Z5189 Encounter for other specified aftercare: Secondary | ICD-10-CM

## 2013-05-08 DIAGNOSIS — Z96659 Presence of unspecified artificial knee joint: Secondary | ICD-10-CM

## 2013-05-08 DIAGNOSIS — Z01812 Encounter for preprocedural laboratory examination: Secondary | ICD-10-CM | POA: Insufficient documentation

## 2013-05-08 DIAGNOSIS — S86811D Strain of other muscle(s) and tendon(s) at lower leg level, right leg, subsequent encounter: Secondary | ICD-10-CM

## 2013-05-08 LAB — PROTIME-INR: INR: 1.12 (ref 0.00–1.49)

## 2013-05-08 LAB — BASIC METABOLIC PANEL
CO2: 27 mEq/L (ref 19–32)
Calcium: 10.4 mg/dL (ref 8.4–10.5)
Creatinine, Ser: 0.76 mg/dL (ref 0.50–1.10)
GFR calc non Af Amer: >90 mL/min (ref >90)

## 2013-05-08 LAB — CBC
MCH: 27 pg (ref 26.0–34.0)
MCV: 81.8 fL (ref 78.0–100.0)
Platelets: 539 10*3/uL — ABNORMAL HIGH (ref 150–400)
RDW: 15.4 % (ref 11.5–15.5)
WBC: 10.9 10*3/uL — ABNORMAL HIGH (ref 4.0–10.5)

## 2013-05-08 LAB — APTT: aPTT: 31 seconds (ref 24–37)

## 2013-05-08 NOTE — Patient Instructions (Signed)
Change dressings daily.

## 2013-05-08 NOTE — Pre-Procedure Instructions (Signed)
Patient in for PAT, still taking ASA 325 mg bid, attempted to call Dr Alto Denver while patient here but could not reach him. Patient is to see him in office today. She will ask him today. Office also called and talked to Tammy in office. She will also make a note in chart.

## 2013-05-08 NOTE — Patient Instructions (Addendum)
[name]  05/08/2013   Your procedure is scheduled on:   05/12/2013  Report to [facility] at  1030  AM.  Call this number if you have problems the morning of surgery: [phone]   Remember:   Do not eat food or drink liquids after midnight.   Take these medicines the morning of surgery with A SIP OF WATER: oxycodone   Do not wear jewelry, make-up or nail polish.  Do not wear lotions, powders, or perfumes.   Do not shave 48 hours prior to surgery. Men may shave face and neck.  Do not bring valuables to the hospital.  [facility] is not responsible  for any belongings or valuables.  Contacts, dentures or bridgework may not be worn into surgery.  Leave suitcase in the car. After surgery it may be brought to your room.  For patients admitted to the hospital, checkout time is 11:00 AM the day of discharge.   Patients discharged the day of surgery will not be allowed to drive home.  Name and phone number of your driver: family  Special Instructions: Shower using CHG 2 nights before surgery and the night before surgery.  If you shower the day of surgery use CHG.  Use special wash - you have one bottle of CHG for all showers.  You should use approximately 1/3 of the bottle for each shower.   Please read over the following fact sheets that you were given: Pain Booklet, Coughing and Deep Breathing, Blood Transfusion Information, Surgical Site Infection Prevention, Anesthesia Post-op Instructions and Care and Recovery After Surgery Patellar Tendon Tear / Disruption with Rehab A patellar tendon tear or disruption is a complete tear of the tendon below the kneecap. The patellar tendon attaches the thigh muscle (quadriceps) to the shinbone (tibia). These muscles are responsible for bending the knee and flexing the hip. A tear in the patellar tendon results in a disability to perform these actions. SYMPTOMS   A "pop" or tear felt in the knee or under the kneecap, at the time of  injury.  Pain, tenderness, swelling, warmth, or redness over and around the patellar tendon.  Pain that gets worse when trying to forcefully straighten the knee or bend the knee.  Inability to straighten the knee when seated.  Crackling sound (crepitation) when the tendon is moved or touched.  Bruising (contusion) around the knee within 48 hours of injury.  Loss of firm fullness when pushing on the area where the tendon ruptured (a defect between the ends of the tendon where they separated from each other). CAUSES  The patellar tendon tears when a force is placed on it that is greater than it can handle. Common causes of injury include:  A stressful incident, such as with jumping, hurdling, or starting a sprint.  Direct hit (trauma) to the knee. RISK INCREASES WITH:  Sports that require sudden, explosive muscle contraction, such as those involving jumping or quick starts.  Running or contact sports.  Poor strength and flexibility.  Previous patellar tendon injury.  Untreated patellar tendinitis.  Corticosteroid injection into the patellar tendon. (Corticosteroid injections weaken tendons.) PREVENTION  Warm up and stretch properly before activity.  Allow for adequate recovery between workouts.  Maintain physical fitness:  Strength, flexibility, and endurance.  Cardiovascular fitness.  Protect the knee with taping, protective strapping, or elastic compression bandage during activity. PROGNOSIS  If treated properly, patellar tendon tears usually heal, with a return to sports within 6 to  9 months after injury.  RELATED COMPLICATIONS   Weakness of the thigh (quadriceps) muscles, especially if the tear is left untreated.  Re-rupture of the tendon after treatment.  Prolonged disability.  Risks of surgery: infection, injury to nerves (numbness, weakness, or paralysis), bleeding, knee stiffness, knee weakness, pain when sitting for long periods, pain when getting up from  a seated position and when kneeling or squatting, pain going up or down stairs or hills, and knee giving way or buckling. TREATMENT  Treatment first involves resting from any activities that aggravate the symptoms. The use of ice and medicine will help reduce pain and inflammation. Applying a compression bandage and elevating the knee above the level of the heart will also help reduce inflammation. Definitive treatment for patellar tendon tears is surgery, because contraction of the quadriceps tendon prevents healing of the tendon. Surgery often involves using stitches (sutures) to sew the ends of the tendon back together. Surgery is followed by restraint of the knee, to allow for healing. After restraint, it is important to perform strengthening and stretching exercises to help regain strength and a full range of motion. These exercises may be completed at home or with a therapist.  MEDICATION   If pain medicine is needed, nonsteroidal anti-inflammatory medicines (aspirin and ibuprofen), or other minor pain relievers (acetaminophen), are often advised.  Do not take pain medicine for 7 days before surgery.  Prescription pain relievers may be given, if your caregiver thinks they are needed. Use only as directed and only as much as you need. COLD THERAPY  Cold treatment (icing) should be applied for 10 to 15 minutes every 2 to 3 hours for inflammation and pain, and immediately after activity that aggravates your symptoms. Use ice packs or an ice massage. SEEK MEDICAL CARE IF:  Pain increases, despite treatment.  Cast discomfort develops.  Any of the following occur after surgery: signs of infection, including fever, increased pain, swelling, redness, drainage of fluids, or bleeding in the affected area.  New, unexplained symptoms develop. (Drugs used in treatment may produce side effects.) EXERCISES RANGE OF MOTION (ROM) AND STRETCHING EXERCISES - Patellar Tendon Tear/Disruption These exercises  may help you when beginning to rehabilitate your injury. Your symptoms may resolve with or without further involvement from your physician, physical therapist or athletic trainer. While completing these exercises, remember:   Restoring tissue flexibility helps normal motion to return to the joints. This allows healthier, less painful movement and activity.  An effective stretch should be held for at least 30 seconds.  A stretch should never be painful. You should only feel a gentle lengthening or release in the stretched tissue. RANGE OF MOTION - Knee Flexion and Extension, Active-Assisted  Sit on the edge of a table or chair with your thighs firmly supported. It may be helpful to place a folded towel under the end of your right / left thigh.  Flexion (bending): Place the ankle of your healthy leg on top of the other ankle. Use your healthy leg to gently bend your right / left knee until you feel a mild tension across the top of your knee.  Hold for __________ seconds.  Extension (straightening): Switch your ankles so your right / left leg is on top. Use your healthy leg to straighten your right / left knee until you feel a mild tension on the backside of your knee.  Hold for __________ seconds. Repeat __________ times. Complete this exercise __________ times per day. RANGE OF MOTION - Knee Flexion,  Active  Lie on your back with both knees straight. (If this causes back discomfort, bend your healthy knee, placing your foot flat on the floor.)  Slowly slide your heel back toward your buttocks until you feel a gentle stretch in the front of your knee or thigh.  Hold for __________ seconds. Slowly slide your heel back to the starting position. Repeat __________ times. Complete this exercise __________ times per day.  STRENGTHENING EXERCISES - Patellar Tendon Tear/Disruption  These exercises may help you when beginning to rehabilitate your injury. They may resolve your symptoms with or  without further involvement from your physician, physical therapist or athletic trainer. While completing these exercises, remember:   Muscles can gain both the endurance and the strength needed for everyday activities through controlled exercises.  Complete these exercises as instructed by your physician, physical therapist or athletic trainer. Increase the resistance and repetitions only as guided by your caregiver. STRENGTH - Quadriceps, Isometrics  Lie on your back with your right / left leg extended and your opposite knee bent.  Gradually tense the muscles in the front of your right / left thigh. You should see either your kneecap slide up toward your hip or increased dimpling just above the knee. This motion will push the back of the knee down toward the floor, mat, or bed on which you are lying.  Hold the muscle as tight as you can without increasing your pain for __________ seconds.  Relax the muscles slowly and completely between each repetition. Repeat __________ times. Complete this exercise __________ times per day.  STRENGTH - Quadriceps, Straight Leg Raises Quality counts! Watch for signs that the quadriceps muscle is working, to be sure you are strengthening the correct muscles and not "cheating" by substituting with healthier muscles.  Lay on your back with your right / left leg extended and your opposite knee bent.  Tense the muscles in the front of your right / left thigh. You should see either your kneecap slide up or increased dimpling just above the knee. Your thigh may even shake a bit.  Tighten these muscles even more and raise your leg 4 to 6 inches off the floor. Hold for __________ seconds.  Keeping these muscles tense, lower your leg.  Relax the muscles slowly and completely between each repetition. Repeat __________ times. Complete this exercise __________ times per day.  STRENGTH - Hip Abductors, Straight Leg Raises  Be aware of your form throughout the  entire exercise, so that you exercise the correct muscles Poor form means that you are not strengthening the correct muscles.  Lie on your side so that your head, shoulders, knee and hip line up. You may bend your lower knee to help maintain your balance. Your right / left leg should be on top.  Roll your hips slightly forward, so that your hips are stacked directly over each other and your right / left knee is facing forward.  Lift your top leg up 4-6 inches, leading with your heel. Be sure that your foot does not drift forward or that your knee does not roll toward the ceiling.  Hold this position for __________ seconds. You should feel the muscles in your outer hip lifting. (You may not notice this until your leg begins to tire.)  Slowly lower your leg to the starting position. Allow the muscles to fully relax before beginning the next repetition. Repeat __________ times. Complete this exercise __________ times per day.  STRENGTH - Hip Extensors, Straight Leg Raises  Lie on your stomach on a firm surface.  Tense the muscles in your buttocks to lift your right / left leg about 4 inches. If you cannot lift your leg this high without arching your back, place a pillow under your hips.  Keep your knee straight. Hold __________ seconds.  Slowly lower your leg to the starting position and allow it to relax completely before starting the next repetition. Repeat __________ times. Complete this exercise __________ times per day.  STRENGTH - Hip Adductors, Straight Leg Raises  Lie on your side so that your head, shoulders, knee and hip line up. You may place your upper foot in front, to help maintain your balance. Your right / left leg should be on the bottom.  Roll your hips slightly forward, so that your hips are stacked directly over each other and your right / left knee is facing forward.  Tense the muscles in your inner thigh and lift your bottom leg 4-6 inches. Hold this position for  __________ seconds.  Slowly lower your leg to the starting position. Allow the muscles to fully relax before beginning the next repetition. Repeat __________ times. Complete this exercise __________ times per day. Document Released: 08/17/2005 Document Revised: 11/09/2011 Document Reviewed: 11/29/2008 [facility] Patient Information 2014 [place], [place]. PATIENT INSTRUCTIONS POST-ANESTHESIA  IMMEDIATELY FOLLOWING SURGERY:  Do not drive or operate machinery for the first twenty four hours after surgery.  Do not make any important decisions for twenty four hours after surgery or while taking narcotic pain medications or sedatives.  If you develop intractable nausea and vomiting or a severe headache please notify your doctor immediately.  FOLLOW-UP:  Please make an appointment with your surgeon as instructed. You do not need to follow up with anesthesia unless specifically instructed to do so.  WOUND CARE INSTRUCTIONS (if applicable):  Keep a dry clean dressing on the anesthesia/puncture wound site if there is drainage.  Once the wound has quit draining you may leave it open to air.  Generally you should leave the bandage intact for twenty four hours unless there is drainage.  If the epidural site drains for more than 36-48 hours please call the anesthesia department.  QUESTIONS?:  Please feel free to call your physician or the hospital operator if you have any questions, and they will be happy to assist you.

## 2013-05-09 NOTE — Progress Notes (Signed)
Patient ID: [name], female   DOB: [birth year], 52 y.o.   MRN: [id number]  Chief Complaint  Patient presents with  . Wound Check    Dressing change    Encounter Diagnoses  Name Primary?  . Patellar tendon rupture, right, subsequent encounter Yes  . S/P total knee replacement, right     BP 164/101  Ht 5\' 2"  (1.575 m)  Wt 280 lb (127.007 kg)  BMI 51.2 kg/m2  LMP 01/15/2012  Dressing change today. There is a half centimeter pinpoint opening in the submental portion of the wound is serosanguineous drainage  Scheduled for surgery end of the week. Dressing changed. Dressing supplies dispensed.

## 2013-05-11 NOTE — H&P (Signed)
TOTAL KNEE REVISION ADMISSION H&P  Patient is being admitted for right knee extensor mechanism reconstruction after total knee  Subjective:  Chief Complaint:right weakness and instability.  HPI: [name], 52 y.o. female, who underwent uncomplicated total knee arthroplasty for disabling osteoarthritis of the right knee on 04-17-2013. She did very well in the hospital and was discharged home POD 3. On or about August 26/27 she incorrectly performed a squat exercise and  felt the knee give way. Since that time she has had increased pain, swelling, weakness and a significant change in the function of her knee.  The knee is noted to have weak extension, poor function and instability with weight bearing; with increased drainage.   It appears that she has disrupted her extensor mechanism.   Patient Active Problem List   Diagnosis Date Noted  . S/P total knee replacement 05/09/2013  . Patellar tendon rupture 05/02/2013  . Arthritis of knee, right 03/30/2013  . Shoulder mass 08/13/2011   Past Medical History  Diagnosis Date  . Eczema   . Muscle spasm of back   . Shortness of breath     with exertion   . Arthritis     Past Surgical History  Procedure Laterality Date  . No past surgeries    . Lipoma excision  11/16/2011    Procedure: EXCISION LIPOMA;  Surgeon: Marlane Hatcher, MD;  Location: [facility];  Service: General;  Laterality: Left;  Excision of neoplasm left shoulder  . Tubal ligation      and burned per patient.   . Multiple extractions with alveoloplasty  12/28/2011    Procedure: MULTIPLE EXTRACION WITH ALVEOLOPLASTY;  Surgeon: Georgia Lopes, DDS;  Location: [facility] OR;  Service: Oral Surgery;  Laterality: Bilateral;  . Mouth surgery    . Shoulder surgery Left   . Total knee arthroplasty Right 04/17/2013    Procedure: RIGHT TOTAL KNEE ARTHROPLASTY;  Surgeon: Vickki Hearing, MD;  Location: [facility];  Service: Orthopedics;  Laterality: Right;  . Joint replacement      r  knee    No prescriptions prior to admission   Allergies  Allergen Reactions  . Chocolate Hives    History  Substance Use Topics  . Smoking status: Former Smoker -- 0.10 packs/day for 1 years    Types: Cigarettes  . Smokeless tobacco: Never Used  . Alcohol Use: No    Family History  Problem Relation Age of Onset  . Arthritis    . Anesthesia problems Neg Hx   . Hypotension Neg Hx   . Malignant hyperthermia Neg Hx   . Pseudochol deficiency Neg Hx   . Diabetes Brother       Review of Systems  Constitutional: Positive for malaise/fatigue. Negative for fever, chills, weight loss and diaphoresis.  HENT: Negative.   Eyes: Negative.   Respiratory: Negative.   Cardiovascular: Negative.   Gastrointestinal: Negative.   Genitourinary: Negative.   Musculoskeletal: Positive for joint pain.  Skin: Positive for rash. Negative for itching.  Neurological: Negative.  Negative for weakness.  Endo/Heme/Allergies: Negative.   Psychiatric/Behavioral: Negative.      Objective:  Physical Exam  Vitals reviewed. Constitutional: She appears well-developed and well-nourished. She is cooperative.  Non-toxic appearance. She does not have a sickly appearance. She does not appear ill. No distress.  HENT:  Head: Hair is normal.  Right Ear: No decreased hearing is noted.  Left Ear: No decreased hearing is noted.  Eyes: Lids are normal. Right eye exhibits no  discharge. Left eye exhibits no discharge.  Neck: Trachea normal. Neck supple. No JVD present. No mass present.  Cardiovascular: Normal rate, regular rhythm and intact distal pulses.   Respiratory: No respiratory distress.  GI: Normal appearance.  Lymphadenopathy:    She has no cervical adenopathy.       Right: No inguinal adenopathy present.       Left: No inguinal adenopathy present.  Neurological: She is alert. She has normal strength. No sensory deficit.  Weakness right knee 2nd to injury   Skin: Rash noted. She is not diaphoretic.      Psychiatric: She has a normal mood and affect. Her speech is normal and behavior is normal. Judgment and thought content normal. Cognition and memory are normal.  Upper extremity exam  The right and left upper extremity:   Inspection and palpation revealed no abnormalities in the upper extremities.   Range of motion is full without contracture.  Motor exam is normal with grade 5 strength.  The joints are fully reduced without subluxation.  There is no atrophy or tremor and muscle tone is normal.  All joints are stable.  Left lower extremity: normal range, strength, stability and alignment.  Right knee: There is a small area of wound drainage from the midportion of the wound. Serosanguineous drainage. Passive range of motion is normal. The patient cannot extend her knee against gravity. Has a palpable defect at the patellar tendon insertion. Collateral ligaments are stable alignment is normal  Vital signs in last 24 hours:    Labs:  Estimated body mass index is 51.20 kg/(m^2) as calculated from the following:   Height as of 04/26/13: 5\' 2"  (1.575 m).   Weight as of 05/02/13: 280 lb (127.007 kg).  Imaging Review Plain radiographs demonstrate indicate stable implants with high riding patella although this is subtle   Assessment/Plan:  Extensor mechanism disruption right knee after total knee  Plan irrigation and debridement right knee which will require removal of the polyethylene. Culture and tissue samples. Reconstruction of right extensor mechanism with Achilles tendon allograft.

## 2013-05-12 ENCOUNTER — Encounter (HOSPITAL_COMMUNITY)
Admission: RE | Disposition: A | Discharge status: Home / Self Care | Payer: Self-pay | Source: Ambulatory Visit | Attending: Orthopedic Surgery

## 2013-05-12 ENCOUNTER — Encounter (HOSPITAL_COMMUNITY): Payer: Self-pay | Admitting: Anesthesiology

## 2013-05-12 ENCOUNTER — Inpatient Hospital Stay (HOSPITAL_COMMUNITY): Payer: Medicaid Other | Admitting: Anesthesiology

## 2013-05-12 ENCOUNTER — Inpatient Hospital Stay (HOSPITAL_COMMUNITY): Payer: Medicaid Other

## 2013-05-12 ENCOUNTER — Encounter (HOSPITAL_COMMUNITY): Payer: Self-pay | Admitting: *Deleted

## 2013-05-12 ENCOUNTER — Inpatient Hospital Stay (HOSPITAL_COMMUNITY)
Admission: RE | Admit: 2013-05-12 | Discharge: 2013-05-15 | DRG: 502 | Disposition: A | Discharge status: Home / Self Care | Payer: Medicaid Other | Source: Ambulatory Visit | Attending: Orthopedic Surgery | Admitting: Orthopedic Surgery

## 2013-05-12 DIAGNOSIS — Z5189 Encounter for other specified aftercare: Secondary | ICD-10-CM

## 2013-05-12 DIAGNOSIS — Z96651 Presence of right artificial knee joint: Secondary | ICD-10-CM

## 2013-05-12 DIAGNOSIS — S86811D Strain of other muscle(s) and tendon(s) at lower leg level, right leg, subsequent encounter: Secondary | ICD-10-CM

## 2013-05-12 DIAGNOSIS — M1711 Unilateral primary osteoarthritis, right knee: Secondary | ICD-10-CM

## 2013-05-12 DIAGNOSIS — L259 Unspecified contact dermatitis, unspecified cause: Secondary | ICD-10-CM | POA: Diagnosis present

## 2013-05-12 DIAGNOSIS — Z96659 Presence of unspecified artificial knee joint: Secondary | ICD-10-CM

## 2013-05-12 DIAGNOSIS — M66269 Spontaneous rupture of extensor tendons, unspecified lower leg: Principal | ICD-10-CM | POA: Diagnosis present

## 2013-05-12 DIAGNOSIS — Z87891 Personal history of nicotine dependence: Secondary | ICD-10-CM

## 2013-05-12 HISTORY — PX: PATELLAR TENDON REPAIR: SHX737

## 2013-05-12 HISTORY — PX: IRRIGATION AND DEBRIDEMENT KNEE: SHX5185

## 2013-05-12 HISTORY — PX: TOTAL KNEE ARTHROPLASTY: SHX125

## 2013-05-12 SURGERY — REPAIR, TENDON, PATELLAR
Anesthesia: Spinal | Site: Knee | Laterality: Right | Wound class: Dirty or Infected

## 2013-05-12 MED ORDER — DIPHENHYDRAMINE HCL 50 MG/ML IJ SOLN
INTRAMUSCULAR | Status: DC | PRN
  Administered 2013-05-12 (×2): 25 mg via INTRAVENOUS

## 2013-05-12 MED ORDER — CEFAZOLIN SODIUM-DEXTROSE 2-3 GM-% IV SOLR
2.0000 g | Freq: Four times a day (QID) | INTRAVENOUS | Status: AC
  Administered 2013-05-12 – 2013-05-13 (×2): 2 g via INTRAVENOUS
  Filled 2013-05-12 (×2): qty 50

## 2013-05-12 MED ORDER — CHLORHEXIDINE GLUCONATE 4 % EX LIQD: 60.0000 mL | Freq: Once | CUTANEOUS | Status: DC

## 2013-05-12 MED ORDER — OXYCODONE-ACETAMINOPHEN 5-325 MG PO TABS
1.0000 | ORAL_TABLET | ORAL | Status: DC
  Administered 2013-05-12 – 2013-05-15 (×17): 1 via ORAL
  Filled 2013-05-12 (×14): qty 1

## 2013-05-12 MED ORDER — PROPOFOL INFUSION 10 MG/ML OPTIME
INTRAVENOUS | Status: DC | PRN
  Administered 2013-05-12: 55 ug/kg/min via INTRAVENOUS
  Administered 2013-05-12: 45 ug/kg/min via INTRAVENOUS
  Administered 2013-05-12 (×2): via INTRAVENOUS

## 2013-05-12 MED ORDER — METOCLOPRAMIDE HCL 5 MG/ML IJ SOLN: 5.0000 mg | Freq: Three times a day (TID) | INTRAMUSCULAR | Status: DC | PRN

## 2013-05-12 MED ORDER — MIDAZOLAM HCL 2 MG/2ML IJ SOLN
1.0000 mg | INTRAMUSCULAR | Status: DC | PRN
  Administered 2013-05-12: 2 mg via INTRAVENOUS

## 2013-05-12 MED ORDER — PROPOFOL 10 MG/ML IV EMUL
INTRAVENOUS | Status: AC
  Filled 2013-05-12: qty 60

## 2013-05-12 MED ORDER — PROPOFOL 10 MG/ML IV EMUL
INTRAVENOUS | Status: AC
  Filled 2013-05-12: qty 20

## 2013-05-12 MED ORDER — ONDANSETRON HCL 4 MG/2ML IJ SOLN
INTRAMUSCULAR | Status: AC
  Filled 2013-05-12: qty 2

## 2013-05-12 MED ORDER — ONDANSETRON HCL 4 MG/2ML IJ SOLN
4.0000 mg | Freq: Once | INTRAMUSCULAR | Status: AC
  Administered 2013-05-12: 4 mg via INTRAVENOUS

## 2013-05-12 MED ORDER — LIDOCAINE HCL (PF) 1 % IJ SOLN
INTRAMUSCULAR | Status: AC
  Filled 2013-05-12: qty 5

## 2013-05-12 MED ORDER — HYDROMORPHONE HCL PF 1 MG/ML IJ SOLN
0.5000 mg | INTRAMUSCULAR | Status: DC | PRN
  Administered 2013-05-12 – 2013-05-13 (×2): 0.5 mg via INTRAVENOUS
  Filled 2013-05-12 (×2): qty 1

## 2013-05-12 MED ORDER — ONDANSETRON HCL 4 MG PO TABS: 4.0000 mg | ORAL_TABLET | Freq: Four times a day (QID) | ORAL | Status: DC | PRN

## 2013-05-12 MED ORDER — PHENOL 1.4 % MT LIQD: 1.0000 | OROMUCOSAL | Status: DC | PRN

## 2013-05-12 MED ORDER — FENTANYL CITRATE 0.05 MG/ML IJ SOLN: 25.0000 ug | INTRAMUSCULAR | Status: DC

## 2013-05-12 MED ORDER — FENTANYL CITRATE 0.05 MG/ML IJ SOLN
INTRAMUSCULAR | Status: AC
  Filled 2013-05-12: qty 2

## 2013-05-12 MED ORDER — ACETAMINOPHEN 325 MG PO TABS: 650.0000 mg | ORAL_TABLET | Freq: Four times a day (QID) | ORAL | Status: DC | PRN

## 2013-05-12 MED ORDER — CEFAZOLIN SODIUM-DEXTROSE 2-3 GM-% IV SOLR
INTRAVENOUS | Status: DC | PRN
  Administered 2013-05-12: 2 g via INTRAVENOUS

## 2013-05-12 MED ORDER — ONDANSETRON HCL 4 MG/2ML IJ SOLN: 4.0000 mg | Freq: Four times a day (QID) | INTRAMUSCULAR | Status: DC | PRN

## 2013-05-12 MED ORDER — MIDAZOLAM HCL 2 MG/2ML IJ SOLN
INTRAMUSCULAR | Status: AC
  Filled 2013-05-12: qty 2

## 2013-05-12 MED ORDER — MAGNESIUM CITRATE PO SOLN: 1.0000 | Freq: Once | ORAL | Status: AC | PRN

## 2013-05-12 MED ORDER — ONDANSETRON HCL 4 MG/2ML IJ SOLN: 4.0000 mg | Freq: Once | INTRAMUSCULAR | Status: DC | PRN

## 2013-05-12 MED ORDER — PHENYLEPHRINE HCL 10 MG/ML IJ SOLN
INTRAMUSCULAR | Status: DC | PRN
  Administered 2013-05-12: 100 ug via INTRAVENOUS
  Administered 2013-05-12: 50 ug via INTRAVENOUS

## 2013-05-12 MED ORDER — FENTANYL CITRATE 0.05 MG/ML IJ SOLN: 25.0000 ug | INTRAMUSCULAR | Status: DC | PRN

## 2013-05-12 MED ORDER — BUPIVACAINE IN DEXTROSE 0.75-8.25 % IT SOLN
INTRATHECAL | Status: DC | PRN
  Administered 2013-05-12: 13 mg via INTRATHECAL

## 2013-05-12 MED ORDER — PREGABALIN 50 MG PO CAPS
50.0000 mg | ORAL_CAPSULE | Freq: Once | ORAL | Status: AC
  Administered 2013-05-12: 50 mg via ORAL

## 2013-05-12 MED ORDER — RIVAROXABAN 10 MG PO TABS
10.0000 mg | ORAL_TABLET | Freq: Every day | ORAL | Status: DC
  Administered 2013-05-13 – 2013-05-15 (×3): 10 mg via ORAL
  Filled 2013-05-12 (×5): qty 1

## 2013-05-12 MED ORDER — OXYCODONE HCL 5 MG PO TABS
5.0000 mg | ORAL_TABLET | Freq: Once | ORAL | Status: AC
  Administered 2013-05-12: 5 mg via ORAL

## 2013-05-12 MED ORDER — METHOCARBAMOL 500 MG PO TABS: 500.0000 mg | ORAL_TABLET | Freq: Four times a day (QID) | ORAL | Status: DC | PRN

## 2013-05-12 MED ORDER — SODIUM CHLORIDE 0.9 % IR SOLN
Status: DC | PRN
  Administered 2013-05-12: 1000 mL

## 2013-05-12 MED ORDER — ACETAMINOPHEN 650 MG RE SUPP
650.0000 mg | Freq: Four times a day (QID) | RECTAL | Status: DC | PRN
  Filled 2013-05-12: qty 1

## 2013-05-12 MED ORDER — LACTATED RINGERS IV SOLN
INTRAVENOUS | Status: DC
  Administered 2013-05-12 (×2): via INTRAVENOUS

## 2013-05-12 MED ORDER — CLOBETASOL PROPIONATE 0.05 % EX OINT
TOPICAL_OINTMENT | Freq: Two times a day (BID) | CUTANEOUS | Status: DC | PRN
  Filled 2013-05-12: qty 15

## 2013-05-12 MED ORDER — DIPHENHYDRAMINE HCL 50 MG/ML IJ SOLN
INTRAMUSCULAR | Status: AC
  Filled 2013-05-12: qty 1

## 2013-05-12 MED ORDER — CEFAZOLIN SODIUM-DEXTROSE 2-3 GM-% IV SOLR: 2.0000 g | INTRAVENOUS | Status: DC

## 2013-05-12 MED ORDER — DIPHENHYDRAMINE HCL 12.5 MG/5ML PO ELIX: 12.5000 mg | ORAL_SOLUTION | ORAL | Status: DC | PRN

## 2013-05-12 MED ORDER — CEFAZOLIN SODIUM-DEXTROSE 2-3 GM-% IV SOLR
INTRAVENOUS | Status: AC
  Filled 2013-05-12: qty 100

## 2013-05-12 MED ORDER — OXYCODONE-ACETAMINOPHEN 5-325 MG PO TABS
1.0000 | ORAL_TABLET | ORAL | Status: DC | PRN
  Filled 2013-05-12 (×3): qty 1

## 2013-05-12 MED ORDER — SENNA 8.6 MG PO TABS
1.0000 | ORAL_TABLET | Freq: Two times a day (BID) | ORAL | Status: DC
  Administered 2013-05-12 – 2013-05-15 (×6): 8.6 mg via ORAL
  Filled 2013-05-12 (×6): qty 1

## 2013-05-12 MED ORDER — BUPIVACAINE-EPINEPHRINE PF 0.5-1:200000 % IJ SOLN
INTRAMUSCULAR | Status: AC
  Filled 2013-05-12: qty 30

## 2013-05-12 MED ORDER — METHOCARBAMOL 100 MG/ML IJ SOLN
500.0000 mg | Freq: Once | INTRAVENOUS | Status: DC
  Filled 2013-05-12: qty 5

## 2013-05-12 MED ORDER — CELECOXIB 100 MG PO CAPS
ORAL_CAPSULE | ORAL | Status: AC
  Filled 2013-05-12: qty 4

## 2013-05-12 MED ORDER — FENTANYL CITRATE 0.05 MG/ML IJ SOLN
INTRAMUSCULAR | Status: DC | PRN
  Administered 2013-05-12: 25 ug via INTRAVENOUS
  Administered 2013-05-12: 25 ug via INTRATHECAL
  Administered 2013-05-12: 50 ug via INTRAVENOUS

## 2013-05-12 MED ORDER — DIPHENHYDRAMINE HCL 25 MG PO CAPS: 25.0000 mg | ORAL_CAPSULE | Freq: Four times a day (QID) | ORAL | Status: DC | PRN

## 2013-05-12 MED ORDER — MIDAZOLAM HCL 5 MG/5ML IJ SOLN
INTRAMUSCULAR | Status: DC | PRN
  Administered 2013-05-12: 2 mg via INTRAVENOUS

## 2013-05-12 MED ORDER — BISACODYL 10 MG RE SUPP: 10.0000 mg | Freq: Every day | RECTAL | Status: DC | PRN

## 2013-05-12 MED ORDER — MENTHOL 3 MG MT LOZG: 1.0000 | LOZENGE | OROMUCOSAL | Status: DC | PRN

## 2013-05-12 MED ORDER — BUPIVACAINE IN DEXTROSE 0.75-8.25 % IT SOLN
INTRATHECAL | Status: AC
  Filled 2013-05-12: qty 2

## 2013-05-12 MED ORDER — CELECOXIB 100 MG PO CAPS
400.0000 mg | ORAL_CAPSULE | Freq: Once | ORAL | Status: AC
  Administered 2013-05-12: 400 mg via ORAL

## 2013-05-12 MED ORDER — OXYCODONE HCL 5 MG PO TABS: 5.0000 mg | ORAL_TABLET | ORAL | Status: DC | PRN

## 2013-05-12 MED ORDER — BUPIVACAINE-EPINEPHRINE (PF) 0.5% -1:200000 IJ SOLN
INTRAMUSCULAR | Status: DC | PRN
  Administered 2013-05-12: 60 mL

## 2013-05-12 MED ORDER — METHOCARBAMOL 100 MG/ML IJ SOLN
500.0000 mg | Freq: Four times a day (QID) | INTRAVENOUS | Status: DC | PRN
  Filled 2013-05-12: qty 5

## 2013-05-12 MED ORDER — CLOBETASOL PROPIONATE 0.05 % EX CREA
1.0000 "application " | TOPICAL_CREAM | Freq: Two times a day (BID) | CUTANEOUS | Status: DC | PRN
  Filled 2013-05-12: qty 15

## 2013-05-12 MED ORDER — CEFAZOLIN SODIUM 1 G IJ SOLR
INTRAMUSCULAR | Status: AC
  Filled 2013-05-12: qty 20

## 2013-05-12 MED ORDER — PREGABALIN 50 MG PO CAPS
ORAL_CAPSULE | ORAL | Status: AC
  Filled 2013-05-12: qty 1

## 2013-05-12 MED ORDER — OXYCODONE HCL 5 MG PO TABS
ORAL_TABLET | ORAL | Status: AC
  Filled 2013-05-12: qty 1

## 2013-05-12 MED ORDER — ALUM & MAG HYDROXIDE-SIMETH 200-200-20 MG/5ML PO SUSP: 30.0000 mL | ORAL | Status: DC | PRN

## 2013-05-12 MED ORDER — PHENYLEPHRINE HCL 10 MG/ML IJ SOLN
INTRAMUSCULAR | Status: AC
  Filled 2013-05-12: qty 1

## 2013-05-12 MED ORDER — PROPOFOL 10 MG/ML IV EMUL
INTRAVENOUS | Status: AC
  Filled 2013-05-12: qty 40

## 2013-05-12 MED ORDER — METOCLOPRAMIDE HCL 10 MG PO TABS: 5.0000 mg | ORAL_TABLET | Freq: Three times a day (TID) | ORAL | Status: DC | PRN

## 2013-05-12 SURGICAL SUPPLY — 92 items
BAG HAMPER (MISCELLANEOUS) ×2 IMPLANT
BANDAGE ELASTIC 4 VELCRO NS (GAUZE/BANDAGES/DRESSINGS) IMPLANT
BANDAGE ELASTIC 6 VELCRO NS (GAUZE/BANDAGES/DRESSINGS) IMPLANT
BANDAGE ESMARK 4X12 BL STRL LF (DISPOSABLE) ×1 IMPLANT
BANDAGE ESMARK 6X9 LF (GAUZE/BANDAGES/DRESSINGS) ×1 IMPLANT
BIT DRILL 2.5X110 QC LCP DISP (BIT) ×1 IMPLANT
BLADE HEX COATED 2.75 (ELECTRODE) ×2 IMPLANT
BLADE SAGITTAL 25.0X1.27X90 (BLADE) ×2 IMPLANT
BLADE SAW SAG 90X13X1.27 (BLADE) ×2 IMPLANT
BLADE SURG SZ10 CARB STEEL (BLADE) ×2 IMPLANT
BNDG CMPR 12X4 ELC STRL LF (DISPOSABLE) ×1
BNDG CMPR 9X6 STRL LF SNTH (GAUZE/BANDAGES/DRESSINGS) ×1
BNDG COHESIVE 4X5 TAN NS LF (GAUZE/BANDAGES/DRESSINGS) ×1 IMPLANT
BNDG ESMARK 4X12 BLUE STRL LF (DISPOSABLE) ×2
BNDG ESMARK 6X9 LF (GAUZE/BANDAGES/DRESSINGS) ×2
CLOTH BEACON ORANGE TIMEOUT ST (SAFETY) ×2 IMPLANT
COOLER CRYO IC GRAV AND TUBE (ORTHOPEDIC SUPPLIES) ×2 IMPLANT
COVER LIGHT HANDLE STERIS (MISCELLANEOUS) ×4 IMPLANT
COVER MAYO STAND XLG (DRAPE) ×1 IMPLANT
COVER PROBE W GEL 5X96 (DRAPES) ×2 IMPLANT
CUFF CRYO KNEE LG 20X31 COOLER (ORTHOPEDIC SUPPLIES) IMPLANT
CUFF CRYO KNEE18X23 MED (MISCELLANEOUS) ×2 IMPLANT
CUFF TOURNIQUET SINGLE 44IN (TOURNIQUET CUFF) ×1 IMPLANT
DECANTER SPIKE VIAL GLASS SM (MISCELLANEOUS) ×5 IMPLANT
DRAPE BACK TABLE (DRAPES) ×2 IMPLANT
DRAPE EXTREMITY T 121X128X90 (DRAPE) ×2 IMPLANT
DRAPE INCISE IOBAN 66X45 STRL (DRAPES) ×2 IMPLANT
DRAPE X-RAY CASS 24X20 (DRAPES) ×2 IMPLANT
DRSG MEPILEX BORDER 4X12 (GAUZE/BANDAGES/DRESSINGS) ×2 IMPLANT
DURAPREP 26ML APPLICATOR (WOUND CARE) ×4 IMPLANT
ELECT REM PT RETURN 9FT ADLT (ELECTROSURGICAL) ×2
ELECTRODE REM PT RTRN 9FT ADLT (ELECTROSURGICAL) ×1 IMPLANT
EVACUATOR 3/16  PVC DRAIN (DRAIN) ×1
EVACUATOR 3/16 PVC DRAIN (DRAIN) ×1 IMPLANT
FACESHIELD LNG OPTICON STERILE (SAFETY) ×1 IMPLANT
GAUZE XEROFORM 5X9 LF (GAUZE/BANDAGES/DRESSINGS) IMPLANT
GLOVE OPTIFIT SS 8.0 STRL (GLOVE) ×2 IMPLANT
GLOVE SKINSENSE NS SZ8.0 LF (GLOVE) ×2
GLOVE SKINSENSE STRL SZ8.0 LF (GLOVE) ×2 IMPLANT
GLOVE SS N UNI LF 8.5 STRL (GLOVE) ×2 IMPLANT
GOWN STRL REIN XL XLG (GOWN DISPOSABLE) ×8 IMPLANT
GRAFT ACHILLES CALC BNE BLCK (Bone Implant) IMPLANT
GRAFT ACHILLES TENDON (Bone Implant) ×2 IMPLANT
HANDPIECE INTERPULSE COAX TIP (DISPOSABLE) ×2
HOOD W/PEELAWAY (MISCELLANEOUS) ×8 IMPLANT
IMMOBILIZER KNEE 19 UNV (ORTHOPEDIC SUPPLIES) ×1 IMPLANT
INSERT STABILIZED 10MM (Knees) ×1 IMPLANT
INST SET MAJOR BONE (KITS) ×2 IMPLANT
INST SET MINOR BONE (KITS) ×2 IMPLANT
IV NS IRRIG 3000ML ARTHROMATIC (IV SOLUTION) ×2 IMPLANT
KIT BLADEGUARD II DBL (SET/KITS/TRAYS/PACK) ×2 IMPLANT
KIT ROOM TURNOVER APOR (KITS) ×2 IMPLANT
MANIFOLD NEPTUNE II (INSTRUMENTS) ×2 IMPLANT
MARKER SKIN DUAL TIP RULER LAB (MISCELLANEOUS) ×2 IMPLANT
NDL HYPO 21X1.5 SAFETY (NEEDLE) ×1 IMPLANT
NEEDLE HYPO 21X1.5 SAFETY (NEEDLE) ×2 IMPLANT
NS IRRIG 1000ML POUR BTL (IV SOLUTION) ×2 IMPLANT
PACK BASIC LIMB (CUSTOM PROCEDURE TRAY) ×2 IMPLANT
PACK TOTAL JOINT (CUSTOM PROCEDURE TRAY) ×2 IMPLANT
PAD ABD 5X9 TENDERSORB (GAUZE/BANDAGES/DRESSINGS) IMPLANT
PAD ARMBOARD 7.5X6 YLW CONV (MISCELLANEOUS) ×2 IMPLANT
PAD DANNIFLEX CPM (ORTHOPEDIC SUPPLIES) ×2 IMPLANT
PADDING CAST COTTON 6X4 STRL (CAST SUPPLIES) ×2 IMPLANT
PASSER SUT SWANSON 36MM LOOP (INSTRUMENTS) ×1 IMPLANT
PIN TROCAR 3 INCH (PIN) ×2 IMPLANT
SCREW CORTEX 3.5 36MM (Screw) ×2 IMPLANT
SCREW CORTEX 3.5 45MM (Screw) ×1 IMPLANT
SCREW CORTEX 3.5 50MM (Screw) ×2 IMPLANT
SCREW LOCK CORT ST 3.5X36 (Screw) IMPLANT
SET BASIN LINEN APH (SET/KITS/TRAYS/PACK) ×2 IMPLANT
SET HNDPC FAN SPRY TIP SCT (DISPOSABLE) ×1 IMPLANT
SPONGE DRAIN TRACH 4X4 STRL 2S (GAUZE/BANDAGES/DRESSINGS) ×1 IMPLANT
SPONGE GAUZE 4X4 12PLY (GAUZE/BANDAGES/DRESSINGS) ×2 IMPLANT
SPONGE LAP 18X18 X RAY DECT (DISPOSABLE) ×2 IMPLANT
STAPLER VISISTAT 35W (STAPLE) ×3 IMPLANT
SUT BRALON NAB BRD #1 30IN (SUTURE) ×2 IMPLANT
SUT ETHIBOND 5 LR DA (SUTURE) ×2 IMPLANT
SUT ETHIBOND NAB OS 4 #2 30IN (SUTURE) ×4 IMPLANT
SUT MON AB 0 CT1 (SUTURE) ×6 IMPLANT
SUT MON AB 2-0 SH 27 (SUTURE) ×2
SUT MON AB 2-0 SH27 (SUTURE) IMPLANT
SUT NUROLON CT 2 BLK #1 18IN (SUTURE) ×1 IMPLANT
SUT VIC AB 1 CT1 27 (SUTURE) ×4
SUT VIC AB 1 CT1 27XBRD ANTBC (SUTURE) IMPLANT
SYR 30ML LL (SYRINGE) ×2 IMPLANT
SYR BULB IRRIGATION 50ML (SYRINGE) ×2 IMPLANT
SYR CONTROL 10ML LL (SYRINGE) ×2 IMPLANT
TOWEL OR 17X26 4PK STRL BLUE (TOWEL DISPOSABLE) ×2 IMPLANT
TRAY FOLEY CATH 16FR SILVER (SET/KITS/TRAYS/PACK) ×2 IMPLANT
WASHER 7MM DIA (Washer) ×2 IMPLANT
WATER STERILE IRR 1000ML POUR (IV SOLUTION) ×8 IMPLANT
YANKAUER SUCT 12FT TUBE ARGYLE (SUCTIONS) ×3 IMPLANT

## 2013-05-12 NOTE — Anesthesia Procedure Notes (Addendum)
Spinal  Patient location during procedure: OR Start time: 05/12/2013 12:44 PM Staffing CRNA/Resident: Glynn Octave E Preanesthetic Checklist Completed: patient identified, site marked, surgical consent, pre-op evaluation, timeout performed, IV checked, risks and benefits discussed and monitors and equipment checked Spinal Block Patient position: right lateral decubitus Prep: Betadine Patient monitoring: heart rate, cardiac monitor, continuous pulse ox and blood pressure Approach: right paramedian Location: L3-4 Injection technique: single-shot Needle Needle type: Spinocan  Needle gauge: 22 G Needle length: 9 cm Assessment Sensory level: T10 Additional Notes ATTEMPTS:1 TRAY UJ:[id number] TRAY EXPIRATION DATE:07/2013

## 2013-05-12 NOTE — Interval H&P Note (Signed)
History and Physical Interval Note:  05/12/2013 12:09 PM  [name]  has presented today for surgery, with the diagnosis of rupture extensor mechanism right knee  The various methods of treatment have been discussed with the patient and family. After consideration of risks, benefits and other options for treatment, the patient has consented to  Procedure(s): PATELLA TENDON REPAIR (Right) ? POLY EXCHANGE (Right) as a surgical intervention .  The patient's history has been reviewed, patient examined, no change in status, stable for surgery.  I have reviewed the patient's chart and labs.  Questions were answered to the patient's satisfaction.     Fuller Canada

## 2013-05-12 NOTE — Transfer of Care (Signed)
Immediate Anesthesia Transfer of Care Note  Patient: [name]  Procedure(s) Performed: Procedure(s): PATELLA TENDON REPAIR AND ALLOGRAFT RECONSTRUCTION (Right) POLY EXCHANGE (Right) IRRIGATION AND DEBRIDEMENT KNEE (Right)  Patient Location: PACU  Anesthesia Type:Spinal  Level of Consciousness: awake, alert  and oriented  Airway & Oxygen Therapy: Patient Spontanous Breathing and Patient connected to face mask oxygen  Post-op Assessment: Report given to PACU RN  Post vital signs: Reviewed and stable  Complications: No apparent anesthesia complications

## 2013-05-12 NOTE — Op Note (Signed)
05/12/2013  3:11 PM  PATIENT:  [name]  52 y.o. female  PRE-OPERATIVE DIAGNOSIS:  rupture extensor mechanism right knee  POST-OPERATIVE DIAGNOSIS:  rupture extensor mechanism right knee  Findings complete rupture of the patellar tendon mid substance rupture the medial and lateral retinaculum  PROCEDURE:  Procedure(s): PATELLA TENDON REPAIR and allograft reconstruction (Right) with allograft Insertion of ( 2)3.5 mm screws with washers Synthes  POLY EXCHANGE (Right)  IRRIGATION AND DEBRIDEMENT KNEE (Right)  Details: The procedure was done in the following manner. The right knee was marked in the preop area. The chart was reviewed and updated. The patient was taken to the operating room for spinal anesthesia. She was placed in supine position. The knee was set up like a total knee replacement. Foley catheter was in place sterile prep and drape was done with Betadine because of the open wound  After timeout and exsanguination of the limb the tourniquet was elevated to 3 mm of mercury. The previous incision was used and extended proximally and distally full-thickness skin flaps were created. A medial arthrotomy was performed proximally. Cultures were obtained. The tendon was ruptured mid substance and included rupture at the joint line including the lateral retinaculum the medial retinaculum. There is approximately 4 cm of tendon still attached to the patella and then the remaining tendon was attached to the tibial tubercle.  The knee was flexed the polyethylene was removed the knee was irrigated and debrided deep cultures and soft tissue cultures were obtained.  A new polyethylene was placed. The knee exhibited balanced in extension as well as flexion.  The medial arthrotomy was closed with #1 Bralon suture. The medial retinacular structures were of poor quality. They would not take suture. I decided to use part of the allograft to close this part of the extensor  mechanism  Because the tendon was not of any length I decided to prepare the bone bed of the tibia with an oscillating saw we made a 3 x 2 x 1 cm bed and then fashioned the allograft to fit the bed and held in place with [2] 3.5 screws with washers. Then with the knee in extension and tension on the soft tissue portion of the graft several #2 Ethibond sutures were placed and then the proximal portion of the graft was folded medially to cover the soft tissue defect in the medial retinacular structures. The lateral retinaculum was repaired with #1 Bralon suture.  The wound was irrigated a drain was placed 60 cc of Marcaine with epinephrine was injected 0 Monocryl was used to close the subcutaneous tissue staples were used to close the skin. The area of draining was excised with sharp dissection.  The patient was taken recovery room in stable condition with a postop plan for 6 weeks of immobilization and extension.  3 weeks a partial weightbearing  Followed by 3 weeks of full weightbearing  At 6 weeks therapy can be started  SURGEON:  Surgeon(s) and Role:    * Vickki Hearing, MD - Primary  PHYSICIAN ASSISTANT:   ASSISTANTS: Margaree Mackintosh her and [place] [facility]   ANESTHESIA:   spinal  EBL:  Total I/O In: 1500 [I.V.:1500] Out: 100 [Urine:75; Blood:25]  BLOOD ADMINISTERED:none  DRAINS: Hemovac drain in the subcutaneous tissue charged  LOCAL MEDICATIONS USED:  60 cc of half percent Marcaine with epinephrine  SPECIMEN:  Deep and superficial wound culture anaerobic aerobic deep tissue culture  DISPOSITION OF SPECIMEN:  PATHOLOGY And microbiology COUNTS:  YES  TOURNIQUET:  Total Tourniquet Time Documented: Calf (Right) - 109 minutes Total: Calf (Right) - 109 minutes   DICTATION: .Reubin Milan Dictation  PLAN OF CARE: Admit to inpatient   PATIENT DISPOSITION:  PACU - hemodynamically stable.   Delay start of Pharmacological VTE agent (>24hrs) due to surgical blood loss or risk of  bleeding: yes

## 2013-05-12 NOTE — Anesthesia Preprocedure Evaluation (Signed)
Anesthesia Evaluation  Patient identified by MRN, date of birth, ID band Patient awake    Reviewed: Allergy & Precautions, H&P , NPO status , Patient's Chart, lab work & pertinent test results  Airway Mallampati: II      Dental  (+) Missing, Poor Dentition, Chipped and Dental Advisory Given   Pulmonary neg pulmonary ROS, shortness of breath and with exertion, Current Smoker,  breath sounds clear to auscultation        Cardiovascular negative cardio ROS  Rhythm:Regular Rate:Normal     Neuro/Psych Anxiety    GI/Hepatic negative GI ROS,   Endo/Other    Renal/GU      Musculoskeletal   Abdominal   Peds  Hematology   Anesthesia Other Findings   Reproductive/Obstetrics                           Anesthesia Physical Anesthesia Plan  ASA: II  Anesthesia Plan: Spinal   Post-op Pain Management:    Induction: Intravenous  Airway Management Planned: Nasal Cannula  Additional Equipment:   Intra-op Plan:   Post-operative Plan:   Informed Consent: I have reviewed the patients History and Physical, chart, labs and discussed the procedure including the risks, benefits and alternatives for the proposed anesthesia with the patient or authorized representative who has indicated his/her understanding and acceptance.     Plan Discussed with:   Anesthesia Plan Comments:         Anesthesia Quick Evaluation

## 2013-05-12 NOTE — Anesthesia Postprocedure Evaluation (Addendum)
  Anesthesia Post-op Note  Patient: [name]  Procedure(s) Performed: Procedure(s): PATELLA TENDON REPAIR AND ALLOGRAFT RECONSTRUCTION (Right) POLY EXCHANGE (Right) IRRIGATION AND DEBRIDEMENT KNEE (Right)  Patient Location: PACU  Anesthesia Type:Spinal  Level of Consciousness: awake, alert  and oriented  Airway and Oxygen Therapy: Patient Spontanous Breathing and Patient connected to nasal cannula oxygen  Post-op Pain: none  Post-op Assessment: Post-op Vital signs reviewed, Patient's Cardiovascular Status Stable, Respiratory Function Stable, Patent Airway and No signs of Nausea or vomiting  Post-op Vital Signs: Reviewed and stable  Complications: No apparent anesthesia complications 05/14/14  [name] is doing well, VSS.  No apparent anesthesia complications.

## 2013-05-12 NOTE — [facility] Notes (Signed)
Dr Romeo Apple asked to verify consent, OK per Dr Romeo Apple.  Cliffton Asters RN, BSN, CNOR, [employer]

## 2013-05-12 NOTE — Brief Op Note (Addendum)
05/12/2013  3:11 PM  PATIENT:  [name]  52 y.o. female  PRE-OPERATIVE DIAGNOSIS:  rupture extensor mechanism right knee  POST-OPERATIVE DIAGNOSIS:  rupture extensor mechanism right knee  Findings complete rupture of the patellar tendon mid substance rupture the medial and lateral retinaculum  PROCEDURE:  Procedure(s): PATELLA TENDON REPAIR and allograft reconstruction (Right) POLY EXCHANGE (Right) IRRIGATION AND DEBRIDEMENT KNEE (Right)  SURGEON:  Surgeon(s) and Role:    * Vickki Hearing, MD - Primary  PHYSICIAN ASSISTANT:   ASSISTANTS: Margaree Mackintosh her and [place]   ANESTHESIA:   spinal  EBL:  Total I/O In: 1500 [I.V.:1500] Out: 100 [Urine:75; Blood:25]  BLOOD ADMINISTERED:none  DRAINS: Hemovac drain in the subcutaneous tissue charged  LOCAL MEDICATIONS USED:  60 cc of half percent Marcaine with epinephrine  SPECIMEN:  Deep and superficial wound culture anaerobic aerobic deep tissue culture  DISPOSITION OF SPECIMEN:  PATHOLOGY And microbiology COUNTS:  YES  TOURNIQUET:   Total Tourniquet Time Documented: Calf (Right) - 109 minutes Total: Calf (Right) - 109 minutes   DICTATION: .Reubin Milan Dictation  PLAN OF CARE: Admit to inpatient   PATIENT DISPOSITION:  PACU - hemodynamically stable.   Delay start of Pharmacological VTE agent (>24hrs) due to surgical blood loss or risk of bleeding: yes

## 2013-05-13 LAB — CBC
Hemoglobin: 10.1 g/dL — ABNORMAL LOW (ref 12.0–15.0)
MCH: 26.8 pg (ref 26.0–34.0)
MCV: 81.2 fL (ref 78.0–100.0)
RBC: 3.77 MIL/uL — ABNORMAL LOW (ref 3.87–5.11)

## 2013-05-13 LAB — BASIC METABOLIC PANEL
CO2: 27 mEq/L (ref 19–32)
Calcium: 9.1 mg/dL (ref 8.4–10.5)
Creatinine, Ser: 0.7 mg/dL (ref 0.50–1.10)
Glucose, Bld: 150 mg/dL — ABNORMAL HIGH (ref 70–99)

## 2013-05-13 NOTE — Evaluation (Signed)
Physical Therapy Evaluation Patient Details Name: [name] MRN: [id number] DOB: August 26, 1961 Today's Date: 05/13/2013 Time:  -     PT Assessment / Plan / Recommendation History of Present Illness   Patient resided alone in a one-level house with her mother and sister visiting her on a daily basis. 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 Patient was able to perform ADLs/functional mobility such as transfers and ambulation at home at [facility] using a SPC and RW prior to most recent hospitalization.   Clinical Impression  Patient was seen for eval and treat today and was able to tolerate session with c/o off/on pain to R knee. Patient was able to get in/out of bed at Indep with R immobilizer intact; transfer activities and in-room ambulation using a RW at SBA maintaining PWB status to RLE with knee immobilizer intact. Patient able to have good carryover of activities and mindful of allowable WB status. RW was given to be utilized to/from bathroom with assistance at all times. Nurse made aware.     PT Assessment  Patient needs continued PT services    Follow Up Recommendations  Home health PT    Does the patient have the potential to tolerate intense rehabilitation      Barriers to Discharge        Equipment Recommendations       Recommendations for Other Services     Frequency Min 5X/week    Precautions / Restrictions Precautions Precautions: Fall Required Braces or Orthoses: Knee Immobilizer - Right Knee Immobilizer - Right: On at all times Restrictions Weight Bearing Restrictions: Yes RLE Weight Bearing: Partial weight bearing RLE Partial Weight Bearing Percentage or Pounds: Partial weightbearing to RLE for 3wks post-op and FWB 3wks after          Mobility  Bed Mobility Bed Mobility: Rolling Right;Rolling Left;Supine to Sit;Sitting - Scoot to [place] of Bed;Sit to Supine Rolling Right: 6: Modified independent (Device/Increase  time) Rolling Left: 6: Modified independent (Device/Increase time) Supine to Sit: 6: Modified independent (Device/Increase time) Sitting - Scoot to Edge of Bed: 6: Modified independent (Device/Increase time) Sit to Supine: 6: Modified independent (Device/Increase time) Transfers Transfers: Stand to Sit;Sit to Stand;Stand Pivot Transfers Sit to Stand: 4: Min guard Stand to Sit: 5: Supervision Stand Pivot Transfers: 4: Min guard Ambulation/Gait Ambulation/Gait Assistance: 4: Min guard Ambulation Distance (Feet): 20 Feet Assistive device: Rolling walker Gait Pattern: Step-to pattern;Antalgic Gait velocity: slowed velocity General Gait Details: step-to pattern using a RW due to WB precaution (PWB) to RLE  Stairs: No    Exercises Total Joint Exercises Ankle Circles/Pumps: AROM;Both;20 reps;Supine Quad Sets: AROM;Right;20 reps;Supine Hip ABduction/ADduction: AROM;AAROM;Both;20 reps;Supine Straight Leg Raises: AROM;AAROM;Both;Supine Knee Flexion: AROM;Left;20 reps;Supine   PT Diagnosis: Difficulty walking;Generalized weakness;Acute pain  PT Problem List: Decreased strength;Decreased range of motion;Decreased activity tolerance;Pain PT Treatment Interventions: Functional mobility training;Therapeutic activities;Therapeutic exercise;Gait training     PT Goals(Current goals can be found in the care plan section) Acute Rehab PT Goals Patient Stated Goal: Patient stated to be able to walk again.  PT Goal Formulation: With patient Time For Goal Achievement: 05/20/13 Potential to Achieve Goals: Good  Visit Information  Last PT Received On: 05/13/13       Prior Functioning  Home Living Family/patient expects to be discharged to:: Private residence Living Arrangements: Alone Available Help at Discharge: Family Type of Home: Apartment Home Access: Stairs to enter [employer] of Steps: 3 steps Entrance  Stairs-Rails: Right Home Layout: One level Home Equipment: Walker - 2  wheels;Cane - single point;Bedside commode Prior Function Level of Independence: Independent with assistive device(s) Communication Communication: No difficulties Dominant Hand: Right    Cognition  Cognition Arousal/Alertness: Awake/alert Behavior During Therapy: WFL for tasks assessed/performed Overall Cognitive Status: Within Functional Limits for tasks assessed    Extremity/Trunk Assessment     Balance    End of Session PT - End of Session Equipment Utilized During Treatment: Gait belt Activity Tolerance: Patient tolerated treatment well Patient left: in bed;with call bell/phone within reach Nurse Communication: Weight bearing status  GP     Francisca Harbuck, Larna Daughters 05/13/2013, 3:45 PM

## 2013-05-13 NOTE — Progress Notes (Signed)
Subjective: 1 Day Post-Op Procedure(s) (LRB): PATELLA TENDON REPAIR AND ALLOGRAFT RECONSTRUCTION (Right) POLY EXCHANGE (Right) IRRIGATION AND DEBRIDEMENT KNEE (Right) Patient reports pain as burning .    Objective: BP 154/84  Pulse 108  Temp(Src) 99 F (37.2 C) (Oral)  Resp 19  Ht 5\' 2"  (1.575 m)  Wt 257 lb (116.574 kg)  BMI 46.99 kg/m2  SpO2 100%   Recent Labs  05/13/13 0545  HGB 10.1*    Recent Labs  05/13/13 0545  WBC 10.2  RBC 3.77*  HCT 30.6*  PLT 465*    Recent Labs  05/13/13 0545  NA 138  K 4.0  CL 103  CO2 27  BUN 7  CREATININE 0.70  GLUCOSE 150*  CALCIUM 9.1   No results found for this basename: LABPT, INR,  in the last 72 hours  no drainage on incison or from hemovac-hemovac removed   Assessment/Plan: 1 Day Post-Op Procedure(s) (LRB): PATELLA TENDON REPAIR AND ALLOGRAFT RECONSTRUCTION (Right) POLY EXCHANGE (Right) IRRIGATION AND DEBRIDEMENT KNEE (Right) Up with therapy Ice inside brace Drain removed   [employer] 05/13/2013, 12:16 PM

## 2013-05-14 LAB — CBC
MCH: 26.3 pg (ref 26.0–34.0)
MCV: 80.6 fL (ref 78.0–100.0)
Platelets: 464 10*3/uL — ABNORMAL HIGH (ref 150–400)
RBC: 3.91 MIL/uL (ref 3.87–5.11)
RDW: 15.1 % (ref 11.5–15.5)

## 2013-05-14 LAB — WOUND CULTURE: Culture: NO GROWTH

## 2013-05-14 NOTE — Progress Notes (Signed)
Subjective: 2 Days Post-Op Procedure(s) (LRB): PATELLA TENDON REPAIR AND ALLOGRAFT RECONSTRUCTION (Right) POLY EXCHANGE (Right) IRRIGATION AND DEBRIDEMENT KNEE (Right) Pain is well controlled at this point  The only issue is that when I came in this morning the patient's knee immobilizer was not on her knee was bent at approximately 60. Reemphasized need to keep immobilizer on replaced immobilizer. Objective: Vital signs in last 24 hours: Temp:  [98.3 F (36.8 C)-99.4 F (37.4 C)] 98.3 F (36.8 C) (09/14 0527) Pulse Rate:  [87-108] 103 (09/14 0527) Resp:  [18-20] 20 (09/14 0748) BP: (135-183)/(67-97) 135/77 mmHg (09/14 0527) SpO2:  [95 %-100 %] 98 % (09/14 0748)  Intake/Output from previous day: 09/13 0701 - 09/14 0700 In: 960 [P.O.:960] Out: 2900 [Urine:2900] Intake/Output this shift:     Recent Labs  05/13/13 0545 05/14/13 0509  HGB 10.1* 10.3*    Recent Labs  05/13/13 0545 05/14/13 0509  WBC 10.2 11.7*  RBC 3.77* 3.91  HCT 30.6* 31.5*  PLT 465* 464*    Recent Labs  05/13/13 0545  NA 138  K 4.0  CL 103  CO2 27  BUN 7  CREATININE 0.70  GLUCOSE 150*  CALCIUM 9.1   No results found for this basename: LABPT, INR,  in the last 72 hours Dressing change wound clean dry and intact Assessment/Plan: 2 Days Post-Op Procedure(s) (LRB): PATELLA TENDON REPAIR AND ALLOGRAFT RECONSTRUCTION (Right) POLY EXCHANGE (Right) IRRIGATION AND DEBRIDEMENT KNEE (Right) Emphasize keeping the knee straight in the brace [place] 05/14/2013, 10:05 AM

## 2013-05-14 NOTE — Progress Notes (Addendum)
D/c note

## 2013-05-14 NOTE — Progress Notes (Signed)
Physical Therapy Treatment Patient Details Name: [name] MRN: [id number] DOB: [birth year] Today's Date: 05/14/2013 Time: 4098-1191 PT Time Calculation (min): 42 min  PT Assessment / Plan / Recommendation  History of Present Illness     PT Comments   Patient was able to perform all therapeutic ex and functional activities today with good carryover and no c/o pain post-session. Patient educated on the importance of PWB to RLE and donning of knee immobilizer at all times. Patient at Supervision for transfers and ambulation on level surfaces with directional changes using a RW with knee immobilizer intact to RLE. Patient's sister was present at the time of session, educated as well of precautions for she will be with patient on a daily basis upon d/c from the hospital. Patient able to tolerate ascending/descending stairs ~6steps (non-alternating to maintain PWB to RLE --hip hike allowed to clear right foot on step) with cues and patient's verbalization of understanding and able to demonstrate safety technique.  Patient left on recliner with call be within reach, no c/o pain with family member present.    Follow Up Recommendations  Home health PT     Does the patient have the potential to tolerate intense rehabilitation     Barriers to Discharge        Equipment Recommendations  Rolling walker with 5" wheels    Recommendations for Other Services    Frequency     Progress towards PT Goals Progress towards PT goals: Progressing toward goals  Plan Current plan remains appropriate    Precautions / Restrictions Precautions Precautions: Fall Required Braces or Orthoses: Knee Immobilizer - Right Knee Immobilizer - Right: On at all times;On when out of bed or walking Restrictions Weight Bearing Restrictions: Yes RLE Weight Bearing: Partial weight bearing RLE Partial Weight Bearing Percentage or Pounds: PWB for 3weeks from time of surgery then progress to FWB after         Mobility  Bed Mobility Rolling Right: 7: Independent Rolling Left: 7: Independent Supine to Sit: 7: Independent Sitting - Scoot to Edge of Bed: 7: Independent Sit to Supine: 7: Independent Transfers Transfers: Sit to Stand;Stand to [employer] Transfers Sit to Stand: 5: Supervision;From chair/3-in-1 Stand to Sit: 5: Supervision Ambulation/Gait Ambulation/Gait Assistance: 5: Supervision Ambulation Distance (Feet): 150 Feet Assistive device: Rolling walker Gait Pattern: Step-to pattern;Antalgic;Right hip hike;Decreased stance time - right Stairs: Yes Stairs Assistance: 4: Min assist Stairs Assistance Details (indicate cue type and reason): Patient made to understand and demonstrate proper techniques during stair climbing activities (UP with LLE and DOWN with RLE with maximization of rails to maintain PWB to RLE) with use of rail and maintaining PWB to RLE (allowed to hip hike to RLE) to clear foot to ascension.  Stair Management Technique: One rail Right Number of Stairs: 6    Exercises Total Joint Exercises Ankle Circles/Pumps: AROM;Both;20 reps;Seated Quad Sets: AROM;Right;Strengthening;20 reps;Seated Hip ABduction/ADduction: AROM;Right;20 reps;Standing Other Exercises Other Exercises: hip flexion with knee extension to RLE with immobilizer intact while standing x 10reps x 2sets    PT Diagnosis:    PT Problem List:   PT Treatment Interventions:     PT Goals (current goals can now be found in the care plan section) Acute Rehab PT Goals Patient Stated Goal: To be pain-free PT Goal Formulation: With patient Time For Goal Achievement: 05/19/13 Potential to Achieve Goals: Good  Visit Information  Last PT Received On: 05/14/13    Subjective Data  Patient Stated Goal: To be pain-free  Cognition  Cognition Arousal/Alertness: Awake/alert Behavior During Therapy: WFL for tasks assessed/performed Overall Cognitive Status: Within Functional Limits for tasks assessed     Balance     End of Session PT - End of Session Equipment Utilized During Treatment: Gait belt Activity Tolerance: Patient tolerated treatment well Patient left: in chair;with call bell/phone within reach;with family/visitor present Nurse Communication: Weight bearing status   GP Functional Limitation: Mobility: Walking and moving around Mobility: Walking and Moving Around Current Status [phone]): At least 1 percent but less than 20 percent impaired, limited or restricted   Janee Ureste, Larna Daughters 05/14/2013, 11:22 AM

## 2013-05-15 ENCOUNTER — Encounter (HOSPITAL_COMMUNITY): Payer: Self-pay | Admitting: Orthopedic Surgery

## 2013-05-15 LAB — CBC
HCT: 30.2 % — ABNORMAL LOW (ref 36.0–46.0)
MCH: 26.1 pg (ref 26.0–34.0)
MCHC: 32.5 g/dL (ref 30.0–36.0)
RDW: 14.8 % (ref 11.5–15.5)

## 2013-05-15 MED ORDER — OXYCODONE-ACETAMINOPHEN 5-325 MG PO TABS: 1.0000 | ORAL_TABLET | ORAL | Status: DC | PRN

## 2013-05-15 NOTE — Care Management Note (Signed)
    Page 1 of 1   05/15/2013     4:02:07 PM   CARE MANAGEMENT NOTE 05/15/2013  Patient:  [name], [name]   Account Number:  [id number]  Date Initiated:  05/15/2013  Documentation initiated by:  Anibal Henderson  Subjective/Objective Assessment:   Admitted from home, for lt knee surgery. Tore patellar tendon after TKA, and had this repaired. Pt will be returning hom. She lives alone, but her sister has been staying to assist as needed since the 1st surgery.     Action/Plan:   HH/PT not ordered. Called Dr Romeo Apple to confirm [facility] PT not needed at this time. Pt states she has all DME from previous surg., but was given a wide walker and it will not go through the doors of her apartment, so she needs a standard size   Anticipated DC Date:  05/15/2013   Anticipated DC Plan:  HOME/SELF CARE      DC Planning Services  CM consult      PAC Choice  DURABLE MEDICAL EQUIPMENT   Choice offered to / List presented to:  C-1 Patient   DME arranged  Levan Hurst      DME agency  [facility]        Status of service:  Completed, signed off Medicare Important Message given?   (If response is "NO", the following Medicare IM given date fields will be blank) Date Medicare IM given:   Date Additional Medicare IM given:    Discharge Disposition:  HOME/SELF CARE  Per UR Regulation:  Reviewed for med. necessity/level of care/duration of stay  If discussed at Long Length of Stay Meetings, dates discussed:    Comments:  05/15/13 1400 Anibal Henderson RN/CM

## 2013-05-15 NOTE — Progress Notes (Signed)
UR Chart Review Completed  

## 2013-05-15 NOTE — Clinical Social Work Note (Signed)
CSW received consult for possible SNF. Pt evaluated by PT and recommendation is for home health. CSW will sign off but can be reconsulted if needed.  Derenda Fennel, [place] [phone]

## 2013-05-15 NOTE — Progress Notes (Signed)
Patient discharged with instructions given on medications and follow up visits, educational material also given on care of knee after surgery and when discharged home, signs,and symptoms of infection,fever,pain etc,patient verbalized understanding. Prescriptions sent with patient. No c/o pain or discomfort noted. Accompanied by staff to an awaiting vehicle.

## 2013-05-16 LAB — TYPE AND SCREEN
ABO/RH(D): O POS
Antibody Screen: NEGATIVE
Unit division: 0

## 2013-05-16 NOTE — Telephone Encounter (Signed)
No note from the doctor, but patient was seen in office and has had knee surgery

## 2013-05-17 LAB — ANAEROBIC CULTURE

## 2013-05-18 NOTE — Discharge Summary (Signed)
Physician Discharge Summary  Patient ID: [name] MRN: [id number] DOB/AGE: 05/16/1961 52 y.o.  Admit date: 05/12/2013 Discharge date: 05/18/2013  Admission Diagnoses: Rupture extensor mechanism right knee status post total knee  Discharge Diagnoses: Same Active Problems:   * No active hospital problems. *   Discharged Condition: stable  Hospital Course: The patient essentially had an unremarkable hospital course she was admitted on 05/12/2013 and had allograft reconstruction of her patellar tendon for distal patellar tendon rupture after knee replacement. We also cultured the wound and irrigated debrided and exchanged the polyethylene to allow adequate debridement. Several cultures were obtained and several tissue specimens were obtained as well. She tolerated her postoperative rehabilitation well with full weightbearing in a brace. The only issue was that on 2 occasions she remove her brace she was advised not to do this and keep her knee straight  Discharge Exam: Blood pressure 115/77, pulse 90, temperature 98.2 F (36.8 C), temperature source Oral, resp. rate 20, height 5\' 2"  (1.575 m), weight 257 lb (116.574 kg), SpO2 96.00%. Incision/Wound: her wound is clean dry and intact with scant drainage. There were no signs of infection. Her calf was soft and supple and she had a negative Homans sign  Disposition: 01-Home or Self Care  Discharge Orders   Future Appointments Provider Department Dept Phone   05/25/2013 11:30 AM Vickki Hearing, MD [facility] [phone]   Future Orders Complete By Expires   Diet - low sodium heart healthy  As directed    Discharge instructions  As directed    Comments:     Keep brace on and knee straight  Partial weight bearing in brace   Discharge wound care:  As directed    Comments:     Change as needed   Increase activity slowly  As directed        Medication List         aspirin 325 MG EC tablet   Take 1 tablet (325 mg total) by mouth 2 (two) times daily.     clobetasol cream 0.05 %  Commonly known as:  TEMOVATE  Apply 1 application topically 2 (two) times daily as needed (rash).     diphenhydrAMINE 25 mg capsule  Commonly known as:  BENADRYL  Take 25 mg by mouth every 6 (six) hours as needed for itching or allergies.     oxyCODONE-acetaminophen 5-325 MG per tablet  Commonly known as:  PERCOCET/ROXICET  Take 1 tablet by mouth every 4 (four) hours as needed for pain.           Follow-up Information   Follow up with Fuller Canada, MD.   Specialties:  Orthopedic Surgery, Radiology   Contact information:   [address], [address] [place] [address] [phone]       Signed: Fuller Canada 05/18/2013, 12:01 PM

## 2013-05-25 ENCOUNTER — Ambulatory Visit (INDEPENDENT_AMBULATORY_CARE_PROVIDER_SITE_OTHER): Payer: Medicaid Other | Admitting: Orthopedic Surgery

## 2013-05-25 VITALS — BP 141/93 | Ht 62.0 in | Wt 280.0 lb

## 2013-05-25 DIAGNOSIS — S86811D Strain of other muscle(s) and tendon(s) at lower leg level, right leg, subsequent encounter: Secondary | ICD-10-CM

## 2013-05-25 DIAGNOSIS — Z9889 Other specified postprocedural states: Secondary | ICD-10-CM

## 2013-05-25 DIAGNOSIS — Z5189 Encounter for other specified aftercare: Secondary | ICD-10-CM

## 2013-05-25 DIAGNOSIS — Z96651 Presence of right artificial knee joint: Secondary | ICD-10-CM

## 2013-05-25 DIAGNOSIS — Z96659 Presence of unspecified artificial knee joint: Secondary | ICD-10-CM

## 2013-05-25 MED ORDER — OXYCODONE-ACETAMINOPHEN 5-325 MG PO TABS: 1.0000 | ORAL_TABLET | ORAL | Status: DC | PRN

## 2013-05-25 NOTE — Progress Notes (Signed)
Patient ID: [name], female   DOB: 01-Oct-1960, 52 y.o.   MRN: [id number]  Chief Complaint  Patient presents with  . Follow-up    Post op 1 Right knee DOS 05/12/13    Status post Achilles allograft reconstruction of the patellar tendon after patellar tendon rupture status post knee replacement right knee  Doing well still not very sure about her compliance with a mobilizer  Wound looks good staples removed  Knee comes to just shy of full extension.  Resume brace in come back in 2 weeks x-rays [facility] and lateral only continue Percocet #180

## 2013-05-25 NOTE — Patient Instructions (Signed)
Keep brace on  Open brace keep leg straight apply heat as needed

## 2013-06-08 ENCOUNTER — Ambulatory Visit (INDEPENDENT_AMBULATORY_CARE_PROVIDER_SITE_OTHER): Payer: Medicaid Other

## 2013-06-08 ENCOUNTER — Ambulatory Visit (INDEPENDENT_AMBULATORY_CARE_PROVIDER_SITE_OTHER): Payer: Medicaid Other | Admitting: Orthopedic Surgery

## 2013-06-08 ENCOUNTER — Ambulatory Visit: Payer: Medicaid Other

## 2013-06-08 ENCOUNTER — Encounter: Payer: Self-pay | Admitting: Orthopedic Surgery

## 2013-06-08 VITALS — BP 124/78 | Ht 62.0 in | Wt 280.0 lb

## 2013-06-08 DIAGNOSIS — Z9889 Other specified postprocedural states: Secondary | ICD-10-CM

## 2013-06-08 DIAGNOSIS — Z96659 Presence of unspecified artificial knee joint: Secondary | ICD-10-CM

## 2013-06-08 DIAGNOSIS — Z5189 Encounter for other specified aftercare: Secondary | ICD-10-CM

## 2013-06-08 DIAGNOSIS — S86811D Strain of other muscle(s) and tendon(s) at lower leg level, right leg, subsequent encounter: Secondary | ICD-10-CM

## 2013-06-08 DIAGNOSIS — Z96651 Presence of right artificial knee joint: Secondary | ICD-10-CM

## 2013-06-08 MED ORDER — OXYCODONE-ACETAMINOPHEN 5-325 MG PO TABS: 1.0000 | ORAL_TABLET | ORAL | Status: DC | PRN

## 2013-06-08 NOTE — Progress Notes (Signed)
Patient ID: [name], female   DOB: October 28, 1960, 52 y.o.   MRN: [id number]  Chief Complaint  Patient presents with  . Follow-up    2 week recheck right knee DOS 05/12/13    Her knee replacement was in August her patellar tendon repair and allograft was in September on the 12th she is doing well she still tends to take the brace off I came in her knee was flexed about 60 she was able to slide her foot forward and bend her knee backwards about 60 with a slight loss of extension noted with passive range of motion  The x-ray shows good incorporation of the allograft  Return in 2 weeks to apply hinged knee brace  Encounter Diagnoses  Name Primary?  . S/P knee surgery Yes  . Patellar tendon rupture, right, subsequent encounter   . S/P total knee replacement, right     Meds ordered this encounter  Medications  . oxyCODONE-acetaminophen (PERCOCET/ROXICET) 5-325 MG per tablet    Sig: Take 1 tablet by mouth every 4 (four) hours as needed for pain.    Dispense:  180 tablet    Refill:  0

## 2013-06-08 NOTE — Patient Instructions (Signed)
Brace full time x 2 weeks

## 2013-06-22 ENCOUNTER — Encounter: Payer: Self-pay | Admitting: Orthopedic Surgery

## 2013-06-22 ENCOUNTER — Ambulatory Visit (INDEPENDENT_AMBULATORY_CARE_PROVIDER_SITE_OTHER): Payer: Medicaid Other | Admitting: Orthopedic Surgery

## 2013-06-22 VITALS — BP 125/76 | Ht 62.0 in | Wt 280.0 lb

## 2013-06-22 DIAGNOSIS — S86811D Strain of other muscle(s) and tendon(s) at lower leg level, right leg, subsequent encounter: Secondary | ICD-10-CM

## 2013-06-22 DIAGNOSIS — Z5189 Encounter for other specified aftercare: Secondary | ICD-10-CM

## 2013-06-22 DIAGNOSIS — Z96651 Presence of right artificial knee joint: Secondary | ICD-10-CM

## 2013-06-22 DIAGNOSIS — Z96659 Presence of unspecified artificial knee joint: Secondary | ICD-10-CM

## 2013-06-22 NOTE — Progress Notes (Signed)
Patient ID: [name], female   DOB: [birth year], 52 y.o.   MRN: [id number]  Chief Complaint  Patient presents with  . Follow-up    2 week recheck Right Knee DOS 05/12/13    The patient is status post total knee arthroplasty complicated by patellar tendon rupture treated with patella tendon allograft she comes in today 6 weeks postop from the second surgery for reevaluation and bracing  The patient's knee flexion is 70 and extension is 30 she has active extension up to 30  She's placed in a range of motion brace 0-70  She can start therapy  Followup in 3 weeks

## 2013-06-22 NOTE — Patient Instructions (Signed)
-   Start therapy

## 2013-07-04 ENCOUNTER — Ambulatory Visit (HOSPITAL_COMMUNITY)
Admission: RE | Admit: 2013-07-04 | Discharge: 2013-07-04 | Disposition: A | Discharge status: Home / Self Care | Payer: Medicaid Other | Source: Ambulatory Visit | Attending: Orthopedic Surgery | Admitting: Orthopedic Surgery

## 2013-07-04 DIAGNOSIS — M25569 Pain in unspecified knee: Secondary | ICD-10-CM | POA: Insufficient documentation

## 2013-07-04 DIAGNOSIS — R29898 Other symptoms and signs involving the musculoskeletal system: Secondary | ICD-10-CM | POA: Insufficient documentation

## 2013-07-04 DIAGNOSIS — IMO0001 Reserved for inherently not codable concepts without codable children: Secondary | ICD-10-CM | POA: Insufficient documentation

## 2013-07-04 DIAGNOSIS — M25669 Stiffness of unspecified knee, not elsewhere classified: Secondary | ICD-10-CM

## 2013-07-04 DIAGNOSIS — M6281 Muscle weakness (generalized): Secondary | ICD-10-CM | POA: Insufficient documentation

## 2013-07-04 DIAGNOSIS — M25469 Effusion, unspecified knee: Secondary | ICD-10-CM | POA: Insufficient documentation

## 2013-07-04 NOTE — Evaluation (Signed)
Physical Therapy Evaluation  Patient Details  Name: [name] MRN: [id number] Date of Birth: Mar 02, 1961  Today's Date: 07/04/2013 Time: 1310-1350 PT Time Calculation (min): 40 min Charge evaluation             Visit#: 1 of 6  Re-eval: 07/21/13 Assessment Diagnosis: Patellar tendon repair/ Rt TKR Surgical Date:  (05/12/2013/ 04/17/2013) Prior Therapy: acute/HH  Authorization: medicaid     Past Medical History:  Past Medical History  Diagnosis Date  . Eczema   . Muscle spasm of back   . Shortness of breath     with exertion   . Arthritis    Past Surgical History:  Past Surgical History  Procedure Laterality Date  . No past surgeries    . Lipoma excision  11/16/2011    Procedure: EXCISION LIPOMA;  Surgeon: Marlane Hatcher, MD;  Location: [facility];  Service: General;  Laterality: Left;  Excision of neoplasm left shoulder  . Tubal ligation      and burned per patient.   . Multiple extractions with alveoloplasty  12/28/2011    Procedure: MULTIPLE EXTRACION WITH ALVEOLOPLASTY;  Surgeon: Georgia Lopes, DDS;  Location: [facility];  Service: Oral Surgery;  Laterality: Bilateral;  . Mouth surgery    . Shoulder surgery Left   . Total knee arthroplasty Right 04/17/2013    Procedure: RIGHT TOTAL KNEE ARTHROPLASTY;  Surgeon: Vickki Hearing, MD;  Location: [facility];  Service: Orthopedics;  Laterality: Right;  . Joint replacement      r knee  . Patellar tendon repair Right 05/12/2013    Procedure: PATELLA TENDON REPAIR AND ALLOGRAFT RECONSTRUCTION;  Surgeon: Vickki Hearing, MD;  Location: [facility];  Service: Orthopedics;  Laterality: Right;  . Total knee arthroplasty Right 05/12/2013    Procedure: POLY EXCHANGE;  Surgeon: Vickki Hearing, MD;  Location: [facility];  Service: Orthopedics;  Laterality: Right;  . Irrigation and debridement knee Right 05/12/2013    Procedure: IRRIGATION AND DEBRIDEMENT KNEE;  Surgeon: Vickki Hearing, MD;  Location: [facility];  Service: Orthopedics;   Laterality: Right;    Subjective Symptoms/Limitations Symptoms: [name] states that she had a TKR on 04/17/2013 and was doing well.  She did a squat and had continuted pain and swelling and was diagnosed with a torn patellar tendon which was repaired on 05/12/2013.  She went [facility] with [facility] and is now being referred to outpatient therapy to maximize her funcitonal ability. How long can you sit comfortably?: couple of minutes How long can you stand comfortably?: Pt is using a knee immobilizer; with the knee and tends to put all her weight on her left foot.  She only stands for less than five minutes. How long can you walk comfortably?: Pt states she has not walked any greater than five minutes. Pain Assessment Currently in Pain?: Yes Pain Score: 6  Pain Location: Knee Pain Orientation: Right Pain Radiating Towards: ankle Pain Onset: More than a month ago Pain Frequency: Constant Pain Relieving Factors: medication Effect of Pain on Daily Activities: increases  Balance Screening Balance Screen Has the patient fallen in the past 6 months: No   Sensation/Coordination/Flexibility/Functional Tests  foto  FS 37 adjusted 36  Assessment RLE AROM (degrees) Right Knee Extension: 12 Right Knee Flexion: 70 RLE Strength Right Hip Flexion: 3-/5 Right Hip Extension: 2+/5 Right Hip ABduction:  (4-/5) Right Hip ADduction: 3/5 Right Knee Flexion: 3-/5  Exercise/Treatments   Seated Long Arc Quad: 5 reps Supine Quad Sets: 5  reps Straight Leg Raises: 5 reps Sidelying Hip ABduction: 5 reps Hip ADduction: 5 reps Prone  Hamstring Curl: 5 reps Hip Extension: 5 reps    Physical Therapy Assessment and Plan PT Assessment and Plan Clinical Impression Statement: Pt who had a TKR in August with a patellar tendon rupture in September who now has decreased ROM, strength and difficulty walking who is being referred to PT to improve her functional abiltiy. Pt will benefit from skilled therapeutic  intervention in order to improve on the following deficits: Abnormal gait;Decreased activity tolerance;Decreased balance;Decreased range of motion;Increased edema;Pain;Decreased strength Rehab Potential: Good PT Frequency: Min 2X/week PT Duration: 4 weeks PT Treatment/Interventions: Gait training;Stair training;Functional mobility training;Therapeutic activities;Therapeutic exercise;Balance training;Patient/family education PT Plan: See pt twice a week for 3 weeks progress to weight bearing activity.  Slow increase in ROM due to patellar tendon repair    Goals Home Exercise Program Pt/caregiver will Perform Home Exercise Program: For increased strengthening PT Short Term Goals Time to Complete Short Term Goals: 2 weeks (10 days) PT Short Term Goal 1: Pt to be able to sit for 30 minutes with comfort to be able to eat at the kitchen table PT Short Term Goal 2: Pt to be able to stand for 15 minutes to be able to wash dishes PT Short Term Goal 3: Pt to be able to ambulate for 15 minutes to have a healthier lifestyle PT Short Term Goal 4: ROM to 90 degrees to allow pt to be able to sit with comfort. PT Long Term Goals Time to Complete Long Term Goals:  (3 weeks) PT Long Term Goal 1: Pt to be able to sit for two hours to travel PT Long Term Goal 2: Pt to be able to stand for 30 minutes to make a meal Long Term Goal 3: Pt to be able to walk for 40 minutes to be able to shop Long Term Goal 4: Pt ROM to be to 5-110 to allow a more normalized gt and ease of going up and down steps  Problem List Patient Active Problem List   Diagnosis Date Noted  . Stiffness of joint, not elsewhere classified, lower leg 07/04/2013  . Weakness of right leg 07/04/2013  . Pain in joint, lower leg 07/04/2013  . S/P total knee replacement 05/09/2013  . Patellar tendon rupture 05/02/2013  . Arthritis of knee, right 03/30/2013  . Shoulder mass 08/13/2011    PT Plan of Care PT Home Exercise Plan: given  GP     Dannia Snook,CINDY 07/04/2013, 4:54 PM  Physician Documentation Your signature is required to indicate approval of the treatment plan as stated above.  Please sign and either send electronically or make a copy of this report for your files and return this physician signed original.   Please mark one 1.__approve of plan  2. ___approve of plan with the following conditions.   ______________________________                                                          _____________________ Physician Signature  Date  

## 2013-07-11 ENCOUNTER — Ambulatory Visit (HOSPITAL_COMMUNITY)
Admission: RE | Admit: 2013-07-11 | Discharge: 2013-07-11 | Disposition: A | Discharge status: Home / Self Care | Payer: Medicaid Other | Source: Ambulatory Visit | Attending: Orthopedic Surgery | Admitting: Orthopedic Surgery

## 2013-07-11 NOTE — Progress Notes (Signed)
Physical Therapy Treatment Patient Details  Name: [name] MRN: [id number] Date of Birth: [birth year]  Today's Date: 07/11/2013 Time: 4098-1191 PT Time Calculation (min): 51 min Charges: Therex x 47'(8295-6213) Manual x [id number]) Ice x 10'(1428-1438)  Visit#: 2 of 6  Re-eval: 07/21/13  Authorization: medicaid  Authorization Visit#: 2 of 6   Subjective: Symptoms/Limitations Symptoms: Pt states that she has been icing frequently at home. Pain Assessment Currently in Pain?: Yes Pain Score: 5  Pain Location: Knee Pain Orientation: Right   Exercise/Treatments Seated Long Arc Quad: 10 reps Supine Quad Sets: 10 reps Short Arc Quad Sets: 10 reps Heel Slides: 10 reps Terminal Knee Extension: 10 reps Straight Leg Raises: 15 reps Sidelying Hip ABduction: 15 reps Hip ADduction: 15 reps Prone  Hamstring Curl: 15 reps Hip Extension: 15 reps   Modalities Modalities: Cryotherapy Manual Therapy Manual Therapy: Other (comment) Other Manual Therapy: Desensitization techniques to scar Cryotherapy Number Minutes Cryotherapy: 10 Minutes Cryotherapy Location: Knee Type of Cryotherapy: Ice pack  Physical Therapy Assessment and Plan PT Assessment and Plan Clinical Impression Statement: Pt requires multimodal cueing to facilitates distal quad contraction. Pt continues to display extensor lag with SLR. Healed incision is extremely sensitive to touch. Educated pt on desensitization techniques. Ice applied at end of session to limit pain and inflammation. Pt reports decreased pain at end of session. Pt will benefit from skilled therapeutic intervention in order to improve on the following deficits: Abnormal gait;Decreased activity tolerance;Decreased balance;Decreased range of motion;Increased edema;Pain;Decreased strength Rehab Potential: Good PT Frequency: Min 2X/week PT Duration: 4 weeks PT Treatment/Interventions: Gait training;Stair training;Functional mobility  training;Therapeutic activities;Therapeutic exercise;Balance training;Patient/family education PT Plan: Continue to progress quad strength and gradually progress motion due to patellar tendon repair.    Goals    Problem List Patient Active Problem List   Diagnosis Date Noted  . Stiffness of joint, not elsewhere classified, lower leg 07/04/2013  . Weakness of right leg 07/04/2013  . Pain in joint, lower leg 07/04/2013  . S/P total knee replacement 05/09/2013  . Patellar tendon rupture 05/02/2013  . Arthritis of knee, right 03/30/2013  . Shoulder mass 08/13/2011    PT - End of Session Activity Tolerance: Patient tolerated treatment well  Seth Bake, PTA  07/11/2013, 2:46 PM

## 2013-07-13 ENCOUNTER — Ambulatory Visit (HOSPITAL_COMMUNITY)
Admission: RE | Admit: 2013-07-13 | Discharge: 2013-07-13 | Disposition: A | Discharge status: Home / Self Care | Payer: Medicaid Other | Source: Ambulatory Visit | Attending: Orthopedic Surgery | Admitting: Orthopedic Surgery

## 2013-07-13 NOTE — Progress Notes (Signed)
Physical Therapy Treatment Patient Details  Name: [name] MRN: [id number] Date of Birth: 10/12/1960  Today's Date: 07/13/2013 Time: 4098-1191 PT Time Calculation (min): 43 min Charges: Therex x 28'(1302-1330) Manual x 12'(1333-1345)   Visit#: 3 of 6  Re-eval: 07/21/13  Authorization: medicaid  Authorization Visit#: 3 of 6   Subjective: Symptoms/Limitations Symptoms: Pt states that she has been using desensitization techniques at home. Pain Assessment Currently in Pain?: Yes Pain Score: 2  Pain Location: Knee Pain Orientation: Right  Exercise/Treatments Supine Quad Sets: 10 reps Short Arc Quad Sets: 10 reps Heel Slides: 10 reps Terminal Knee Extension: 10 reps Straight Leg Raises: 5 reps (5 only secondary to distal quad fatigue) Sidelying Hip ABduction: 15 reps Hip ADduction: 15 reps Prone  Hamstring Curl: 15 reps Hip Extension: 15 reps   Manual Therapy Manual Therapy: Other (comment) Other Manual Therapy: Desensitization techniques to scar/ gentle MFR around scar to decrease adhesions  Physical Therapy Assessment and Plan PT Assessment and Plan Clinical Impression Statement: Treatment focus on improving quad strength and decreasing scar sensitivity/adhesions. Pt responds well to desensitization techniques. Progress noted sent to MD prior to MD appointment on 07/18/13.  Pt declines ice and plans to ice at home. Pt will benefit from skilled therapeutic intervention in order to improve on the following deficits: Abnormal gait;Decreased activity tolerance;Decreased balance;Decreased range of motion;Increased edema;Pain;Decreased strength Rehab Potential: Good PT Frequency: Min 2X/week PT Duration: 4 weeks PT Treatment/Interventions: Gait training;Stair training;Functional mobility training;Therapeutic activities;Therapeutic exercise;Balance training;Patient/family education PT Plan: Continue to progress quad strength and gradually progress motion due to  patellar tendon repair.    Problem List Patient Active Problem List   Diagnosis Date Noted  . Stiffness of joint, not elsewhere classified, lower leg 07/04/2013  . Weakness of right leg 07/04/2013  . Pain in joint, lower leg 07/04/2013  . S/P total knee replacement 05/09/2013  . Patellar tendon rupture 05/02/2013  . Arthritis of knee, right 03/30/2013  . Shoulder mass 08/13/2011    PT - End of Session Activity Tolerance: Patient tolerated treatment well  Seth Bake, PTA  07/13/2013, 4:50 PM

## 2013-07-18 ENCOUNTER — Telehealth (HOSPITAL_COMMUNITY): Payer: Self-pay

## 2013-07-18 ENCOUNTER — Ambulatory Visit (HOSPITAL_COMMUNITY): Payer: Medicaid Other | Admitting: Physical Therapy

## 2013-07-18 ENCOUNTER — Ambulatory Visit: Payer: Medicaid Other | Admitting: Orthopedic Surgery

## 2013-07-20 ENCOUNTER — Ambulatory Visit (HOSPITAL_COMMUNITY)
Admission: RE | Admit: 2013-07-20 | Discharge: 2013-07-20 | Disposition: A | Discharge status: Home / Self Care | Payer: Medicaid Other | Source: Ambulatory Visit | Attending: Orthopedic Surgery | Admitting: Orthopedic Surgery

## 2013-07-20 NOTE — Progress Notes (Signed)
Physical Therapy Treatment Patient Details  Name: GWENEVERE GOGA MRN: [id number] Date of Birth: 1961/06/16  Today's Date: 07/20/2013 Time: 4098-1191 PT Time Calculation (min): 41 min Charges: Therex x 38'  Visit#: 4 of 6  Re-eval: 07/21/13  Authorization: medicaid  Authorization Visit#: 4 of 6   Subjective: Symptoms/Limitations Symptoms: Pt states taht she has been working at home completing her exercises everyday. Pain Assessment Currently in Pain?: Yes Pain Score: 2  Pain Location: Knee Pain Orientation: Right  Exercise/Treatments Standing Heel Raises: 10 reps;Limitations Heel Raises Limitations: Toe raises x 10 SLS: RLE: 3" max of 3 Seated Long Arc Quad: 10 reps Other Seated Knee Exercises: Sit to stand x 10 without UE assistance (brace on) Supine Quad Sets: 10 reps Short Arc Quad Sets: 10 reps Terminal Knee Extension: 10 reps Straight Leg Raises: 10 reps Prone  Hamstring Curl: 15 reps Hip Extension: 15 reps Other Prone Exercises: TKE x 10   Physical Therapy Assessment and Plan PT Assessment and Plan Clinical Impression Statement: Pt continues to displays improved quad coordination. Scar appears to be less sensitive to touch. Progressed to standing exercises with knee brace on. Pt is anxious about completing squats secondary to patellar injury. Encouraged pt to complete sit to stand at home as she is not comfortable with mini squats. Pt will benefit from skilled therapeutic intervention in order to improve on the following deficits: Abnormal gait;Decreased activity tolerance;Decreased balance;Decreased range of motion;Increased edema;Pain;Decreased strength Rehab Potential: Good PT Frequency: Min 2X/week PT Duration: 4 weeks PT Treatment/Interventions: Gait training;Stair training;Functional mobility training;Therapeutic activities;Therapeutic exercise;Balance training;Patient/family education PT Plan: Continue to progress quad strength and gradually progress  motion due to patellar tendon repair.     Problem List Patient Active Problem List   Diagnosis Date Noted  . Stiffness of joint, not elsewhere classified, lower leg 07/04/2013  . Weakness of right leg 07/04/2013  . Pain in joint, lower leg 07/04/2013  . S/P total knee replacement 05/09/2013  . Patellar tendon rupture 05/02/2013  . Arthritis of knee, right 03/30/2013  . Shoulder mass 08/13/2011    PT - End of Session Activity Tolerance: Patient tolerated treatment well General Behavior During Therapy: [facility] for tasks assessed/performed  Seth Bake, PTA 07/20/2013, 1:57 PM

## 2013-07-25 ENCOUNTER — Inpatient Hospital Stay (HOSPITAL_COMMUNITY): Admission: RE | Admit: 2013-07-25 | Payer: Medicaid Other | Source: Ambulatory Visit | Admitting: Physical Therapy

## 2013-07-25 ENCOUNTER — Ambulatory Visit (INDEPENDENT_AMBULATORY_CARE_PROVIDER_SITE_OTHER): Payer: Medicaid Other | Admitting: Orthopedic Surgery

## 2013-07-25 VITALS — Ht 62.0 in | Wt 280.0 lb

## 2013-07-25 DIAGNOSIS — Z9889 Other specified postprocedural states: Secondary | ICD-10-CM

## 2013-07-25 MED ORDER — OXYCODONE-ACETAMINOPHEN 5-325 MG PO TABS: 1.0000 | ORAL_TABLET | ORAL | Status: DC | PRN

## 2013-07-25 NOTE — Patient Instructions (Signed)
Continue exercises   Wear brace for walking

## 2013-07-26 NOTE — Progress Notes (Signed)
Patient ID: [name], female   DOB: [birth year], 52 y.o.   MRN: [id number] Ht 5\' 2"  (1.575 m)  Wt 280 lb (127.007 kg)  BMI 51.20 kg/m2  LMP 01/15/2012 Encounter Diagnosis  Name Primary?  . S/P knee surgery Yes    Chief Complaint  Patient presents with  . Follow-up    3 week recheck on right knee. DOS 05-12-13.   This patient is status post right total knee come to the patella tendon rupture treated with patella tendon allograft presents now for the 10 week followup. Last seen 6 weeks ago. She is in a hinged knee brace she went to therapy her a lot of times per Medicaid which of course is an adequate. But she is doing well her knee flexion is 90 her knee extension shows a 20 extensor lag but she has straight leg raise with extensor lag.  She's placed back in her knee brace she'll continue home exercises all see her in 6 weeks. Continue Percocet for pain  Meds ordered this encounter  Medications  . oxyCODONE-acetaminophen (PERCOCET/ROXICET) 5-325 MG per tablet    Sig: Take 1 tablet by mouth every 4 (four) hours as needed.    Dispense:  180 tablet    Refill:  0

## 2013-08-03 ENCOUNTER — Ambulatory Visit (HOSPITAL_COMMUNITY): Payer: Medicaid Other | Admitting: *Deleted

## 2013-08-08 ENCOUNTER — Ambulatory Visit (HOSPITAL_COMMUNITY)
Admission: RE | Admit: 2013-08-08 | Discharge: 2013-08-08 | Disposition: A | Discharge status: Home / Self Care | Payer: Medicaid Other | Source: Ambulatory Visit | Attending: Family Medicine | Admitting: Family Medicine

## 2013-08-08 DIAGNOSIS — M25569 Pain in unspecified knee: Secondary | ICD-10-CM | POA: Insufficient documentation

## 2013-08-08 DIAGNOSIS — M6281 Muscle weakness (generalized): Secondary | ICD-10-CM | POA: Insufficient documentation

## 2013-08-08 DIAGNOSIS — M25469 Effusion, unspecified knee: Secondary | ICD-10-CM | POA: Insufficient documentation

## 2013-08-08 DIAGNOSIS — IMO0001 Reserved for inherently not codable concepts without codable children: Secondary | ICD-10-CM | POA: Insufficient documentation

## 2013-08-08 NOTE — Evaluation (Signed)
Physical Therapy Discharge Summary  Patient Details  Name: [name] MRN: [id number] Date of Birth: [birth year]  Today's Date: 08/08/2013 Time: 1435-1510 PT Time Calculation (min): 35 min Charges: MMT/ROMM x 4(0981-1914) Self-care x 78'(2956-2130)              Visit#: 5 of 6  Re-eval: 07/21/13 Assessment Diagnosis: Patellar tendon repair/ Rt TKR  Authorization: medicaid    Authorization Visit#: 5 of 6   Past Medical History:  Past Medical History  Diagnosis Date  . Eczema   . Muscle spasm of back   . Shortness of breath     with exertion   . Arthritis    Past Surgical History:  Past Surgical History  Procedure Laterality Date  . No past surgeries    . Lipoma excision  11/16/2011    Procedure: EXCISION LIPOMA;  Surgeon: Marlane Hatcher, MD;  Location: [facility];  Service: General;  Laterality: Left;  Excision of neoplasm left shoulder  . Tubal ligation      and burned per patient.   . Multiple extractions with alveoloplasty  12/28/2011    Procedure: MULTIPLE EXTRACION WITH ALVEOLOPLASTY;  Surgeon: Georgia Lopes, DDS;  Location: [facility];  Service: Oral Surgery;  Laterality: Bilateral;  . Mouth surgery    . Shoulder surgery Left   . Total knee arthroplasty Right 04/17/2013    Procedure: RIGHT TOTAL KNEE ARTHROPLASTY;  Surgeon: Vickki Hearing, MD;  Location: [facility];  Service: Orthopedics;  Laterality: Right;  . Joint replacement      r knee  . Patellar tendon repair Right 05/12/2013    Procedure: PATELLA TENDON REPAIR AND ALLOGRAFT RECONSTRUCTION;  Surgeon: Vickki Hearing, MD;  Location: [facility];  Service: Orthopedics;  Laterality: Right;  . Total knee arthroplasty Right 05/12/2013    Procedure: POLY EXCHANGE;  Surgeon: Vickki Hearing, MD;  Location: [facility];  Service: Orthopedics;  Laterality: Right;  . Irrigation and debridement knee Right 05/12/2013    Procedure: IRRIGATION AND DEBRIDEMENT KNEE;  Surgeon: Vickki Hearing, MD;  Location: [facility];  Service:  Orthopedics;  Laterality: Right;    Subjective Symptoms/Limitations Symptoms: Pt reports continued HEP compliance. Pain Assessment Currently in Pain?: Yes Pain Score: 2  Pain Location: Knee  Assessment RLE AROM (degrees) Right Knee Extension: 7 (was 10 (07/13/13)) Right Knee Flexion: 90 (was 70 (07/13/13)) RLE Strength Right Hip Flexion: 5/5 (was 3-/5 (07/04/13)) Right Hip Extension: 4/5 (was 4/5 (07/04/13)) Right Hip ABduction: 5/5 (was 4-/5 (07/04/13)) Right Hip ADduction: 5/5 (was 3/5 (07/04/13)) Right Knee Flexion: 5/5 (was 3-/5 (07/14/13)) Right Knee Extension:  (Pt can hold against max resist, lacks distal quad strength)  Physical Therapy Assessment and Plan PT Assessment and Plan Clinical Impression Statement: Pt diaplsy improved strength and ROM. Pt would benefit from continuing skilled PT. Unfortunately pt's insurance will not cover any more visits. MD is aware of this. Insuructed to continue strengthening exericse and increase focus on stretching to improve motion. HEP given (see scanned documents.) Pt will benefit from skilled therapeutic intervention in order to improve on the following deficits: Abnormal gait;Decreased activity tolerance;Decreased balance;Decreased range of motion;Increased edema;Pain;Decreased strength Rehab Potential: Good PT Frequency: Min 2X/week PT Duration: 4 weeks PT Treatment/Interventions: Gait training;Stair training;Functional mobility training;Therapeutic activities;Therapeutic exercise;Balance training;Patient/family education PT Plan: Recommend D/C to HEP secondary to lack of insurance coverage.     Goals PT Short Term Goals PT Short Term Goal 1: Pt to be able to sit for 30 minutes with comfort  to be able to eat at the kitchen table PT Short Term Goal 1 - Progress: Met PT Short Term Goal 2: Pt to be able to stand for 15 minutes to be able to wash dishes (Pt can stand 5-10') PT Short Term Goal 2 - Progress: Progressing toward goal PT Short  Term Goal 3: Pt to be able to ambulate for 15 minutes to have a healthier lifestyle (Pt can walk 5-10') PT Short Term Goal 4: ROM to 90 degrees to allow pt to be able to sit with comfort. (Active flexion on RLE is 85) PT Short Term Goal 4 - Progress: Progressing toward goal PT Long Term Goals Time to Complete Long Term Goals:  (3 weeks) PT Long Term Goal 1: Pt to be able to sit for two hours to travel PT Long Term Goal 1 - Progress: Progressing toward goal PT Long Term Goal 2: Pt to be able to stand for 30 minutes to make a meal (Pt can stand for 5-10') PT Long Term Goal 2 - Progress: Progressing toward goal Long Term Goal 3: Pt to be able to walk for 40 minutes to be able to shop (Pt can walk for 5-10') Long Term Goal 3 Progress: Progressing toward goal Long Term Goal 4: Pt ROM to be to 5-110 to allow a more normalized gt and ease of going up and down steps (RLE AROM is 7-85) Long Term Goal 4 Progress: Progressing toward goal  Problem List Patient Active Problem List   Diagnosis Date Noted  . Stiffness of joint, not elsewhere classified, lower leg 07/04/2013  . Weakness of right leg 07/04/2013  . Pain in joint, lower leg 07/04/2013  . S/P total knee replacement 05/09/2013  . Patellar tendon rupture 05/02/2013  . Arthritis of knee, right 03/30/2013  . Shoulder mass 08/13/2011    PT - End of Session Activity Tolerance: Patient tolerated treatment well General Behavior During Therapy: [facility] for tasks assessed/performed   Seth Bake, PTA  08/08/2013, 3:52 PM  Physician Documentation Your signature is required to indicate approval of the treatment plan as stated above.  Please sign and either send electronically or make a copy of this report for your files and return this physician signed original.   Please mark one 1.__approve of plan  2. ___approve of plan with the following conditions.   ______________________________                                                           _____________________ Physician Signature                                                                                                             Date

## 2013-08-11 ENCOUNTER — Inpatient Hospital Stay (HOSPITAL_COMMUNITY): Admission: RE | Admit: 2013-08-11 | Payer: Medicaid Other | Source: Ambulatory Visit | Admitting: Physical Therapy

## 2013-08-16 ENCOUNTER — Encounter: Payer: Self-pay | Admitting: *Deleted

## 2013-08-16 ENCOUNTER — Telehealth: Payer: Self-pay | Admitting: Orthopedic Surgery

## 2013-08-16 NOTE — Telephone Encounter (Signed)
Received call from Randi at Dr Blondell Reveal' office regarding pre-medication for dental services, due to patient having had total knee replacement surgery.  Please send/fax letter for patient's file at dental office, indicating that patient will always require pre-med.  Patient is being given the prescription by Dr. Blondell Reveal - Clindomycin - if any questions, his office  Ph [phone]. I also called patient - she verified that she has the prescription and will have it filled for her re-scheduled dental appointment on 09/30/13.  Patient is scheduled for her regular visit here 09/07/13.

## 2013-08-16 NOTE — Telephone Encounter (Signed)
Faxed letter to Dr. Kristine Garbe office

## 2013-08-18 ENCOUNTER — Other Ambulatory Visit (HOSPITAL_COMMUNITY)
Admission: RE | Admit: 2013-08-18 | Discharge: 2013-08-18 | Disposition: A | Discharge status: Home / Self Care | Payer: Medicaid Other | Source: Ambulatory Visit | Attending: Obstetrics & Gynecology | Admitting: Obstetrics & Gynecology

## 2013-08-18 ENCOUNTER — Ambulatory Visit (INDEPENDENT_AMBULATORY_CARE_PROVIDER_SITE_OTHER): Payer: Medicaid Other | Admitting: Obstetrics & Gynecology

## 2013-08-18 ENCOUNTER — Encounter: Payer: Self-pay | Admitting: Obstetrics & Gynecology

## 2013-08-18 VITALS — BP 150/80 | Ht 62.0 in | Wt 222.0 lb

## 2013-08-18 DIAGNOSIS — Z0000 Encounter for general adult medical examination without abnormal findings: Secondary | ICD-10-CM

## 2013-08-18 DIAGNOSIS — Z01419 Encounter for gynecological examination (general) (routine) without abnormal findings: Secondary | ICD-10-CM | POA: Insufficient documentation

## 2013-08-18 DIAGNOSIS — Z1212 Encounter for screening for malignant neoplasm of rectum: Secondary | ICD-10-CM

## 2013-08-18 DIAGNOSIS — Z1151 Encounter for screening for human papillomavirus (HPV): Secondary | ICD-10-CM | POA: Insufficient documentation

## 2013-08-18 NOTE — Progress Notes (Signed)
Patient ID: [name], female   DOB: 1961-02-10, 52 y.o.   MRN: [id number] Subjective:     [name] is a 52 y.o. female here for a routine exam.  Patient's last menstrual period was 01/15/2012. [id number] Birth Control Method:  none Menstrual Calendar(currently): none  Current complaints: none.   Current acute medical issues:  Right knee   Recent Gynecologic History Patient's last menstrual period was 01/15/2012. Last Pap: 2013,  normal Last mammogram: 2013,  normal  Past Medical History  Diagnosis Date  . Eczema   . Muscle spasm of back   . Shortness of breath     with exertion   . Arthritis     Past Surgical History  Procedure Laterality Date  . No past surgeries    . Lipoma excision  11/16/2011    Procedure: EXCISION LIPOMA;  Surgeon: Marlane Hatcher, MD;  Location: [facility];  Service: General;  Laterality: Left;  Excision of neoplasm left shoulder  . Tubal ligation      and burned per patient.   . Multiple extractions with alveoloplasty  12/28/2011    Procedure: MULTIPLE EXTRACION WITH ALVEOLOPLASTY;  Surgeon: Georgia Lopes, DDS;  Location: [facility];  Service: Oral Surgery;  Laterality: Bilateral;  . Mouth surgery    . Shoulder surgery Left   . Total knee arthroplasty Right 04/17/2013    Procedure: RIGHT TOTAL KNEE ARTHROPLASTY;  Surgeon: Vickki Hearing, MD;  Location: [facility];  Service: Orthopedics;  Laterality: Right;  . Joint replacement      r knee  . Patellar tendon repair Right 05/12/2013    Procedure: PATELLA TENDON REPAIR AND ALLOGRAFT RECONSTRUCTION;  Surgeon: Vickki Hearing, MD;  Location: [facility];  Service: Orthopedics;  Laterality: Right;  . Total knee arthroplasty Right 05/12/2013    Procedure: POLY EXCHANGE;  Surgeon: Vickki Hearing, MD;  Location: [facility];  Service: Orthopedics;  Laterality: Right;  . Irrigation and debridement knee Right 05/12/2013    Procedure: IRRIGATION AND DEBRIDEMENT KNEE;  Surgeon: Vickki Hearing, MD;   Location: [facility];  Service: Orthopedics;  Laterality: Right;    OB History   Grav Para Term Preterm Abortions TAB SAB Ect Mult Living   5 5 5      1 6       History   Social History  . Marital Status: Single    Spouse Name: N/A    Number of Children: N/A  . Years of Education: 12   Occupational History  .     Social History Main Topics  . Smoking status: Former Smoker -- 0.10 packs/day for 1 years    Types: Cigarettes  . Smokeless tobacco: Never Used  . Alcohol Use: No  . Drug Use: No  . Sexual Activity: Yes   Other Topics Concern  . None   Social History Narrative  . None    Family History  Problem Relation Age of Onset  . Arthritis    . Anesthesia problems Neg Hx   . Hypotension Neg Hx   . Malignant hyperthermia Neg Hx   . Pseudochol deficiency Neg Hx   . Diabetes Brother      Review of Systems  Review of Systems  Constitutional: Negative for fever, chills, weight loss, malaise/fatigue and diaphoresis.  HENT: Negative for hearing loss, ear pain, nosebleeds, congestion, sore throat, neck pain, tinnitus and ear discharge.   Eyes: Negative for blurred vision, double vision, photophobia, pain, discharge and redness.  Respiratory:  Negative for cough, hemoptysis, sputum production, shortness of breath, wheezing and stridor.   Cardiovascular: Negative for chest pain, palpitations, orthopnea, claudication, leg swelling and PND.  Gastrointestinal: negative for abdominal pain. Negative for heartburn, nausea, vomiting, diarrhea, constipation, blood in stool and melena.  Genitourinary: Negative for dysuria, urgency, frequency, hematuria and flank pain.  Musculoskeletal: Negative for myalgias, back pain, joint pain and falls.  Skin: Negative for itching and rash.  Neurological: Negative for dizziness, tingling, tremors, sensory change, speech change, focal weakness, seizures, loss of consciousness, weakness and headaches.  Endo/Heme/Allergies: Negative for environmental  allergies and polydipsia. Does not bruise/bleed easily.  Psychiatric/Behavioral: Negative for depression, suicidal ideas, hallucinations, memory loss and substance abuse. The patient is not nervous/anxious and does not have insomnia.        Objective:    Physical Exam  Vitals reviewed. Constitutional: She is oriented to person, place, and time. She appears well-developed and well-nourished.  HENT:  Head: Normocephalic and atraumatic.        Right Ear: External ear normal.  Left Ear: External ear normal.  Nose: Nose normal.  Mouth/Throat: Oropharynx is clear and moist.  Eyes: Conjunctivae and EOM are normal. Pupils are equal, round, and reactive to light. Right eye exhibits no discharge. Left eye exhibits no discharge. No scleral icterus.  Neck: Normal range of motion. Neck supple. No tracheal deviation present. No thyromegaly present.  Cardiovascular: Normal rate, regular rhythm, normal heart sounds and intact distal pulses.  Exam reveals no gallop and no friction rub.   No murmur heard. Respiratory: Effort normal and breath sounds normal. No respiratory distress. She has no wheezes. She has no rales. She exhibits no tenderness.  GI: Soft. Bowel sounds are normal. She exhibits no distension and no mass. There is no tenderness. There is no rebound and no guarding.  Genitourinary:  Breasts no masses skin changes or nipple changes bilaterally      Vulva is normal without lesions Vagina is pink moist without discharge Cervix normal in appearance and pap is done Uterus is normal size shape and contour Adnexa is negative with normal sized ovaries  Rectal    hemoccult negative, normal tone, no masses  Musculoskeletal: Normal range of motion. She exhibits no edema and no tenderness.  Neurological: She is alert and oriented to person, place, and time. She has normal reflexes. She displays normal reflexes. No cranial nerve deficit. She exhibits normal muscle tone. Coordination normal.  Skin:  Skin is warm and dry. No rash noted. No erythema. No pallor.  Psychiatric: She has a normal mood and affect. Her behavior is normal. Judgment and thought content normal.       Assessment:    Healthy female exam.    Plan:    Follow up in: 1 year.

## 2013-08-18 NOTE — Addendum Note (Signed)
Addended by: Criss Alvine on: 08/18/2013 02:15 PM   Modules accepted: Orders

## 2013-09-05 ENCOUNTER — Other Ambulatory Visit (HOSPITAL_COMMUNITY): Payer: Self-pay | Admitting: Family Medicine

## 2013-09-05 DIAGNOSIS — Z139 Encounter for screening, unspecified: Secondary | ICD-10-CM

## 2013-09-07 ENCOUNTER — Ambulatory Visit (INDEPENDENT_AMBULATORY_CARE_PROVIDER_SITE_OTHER): Payer: Medicare Other | Admitting: Orthopedic Surgery

## 2013-09-07 ENCOUNTER — Encounter: Payer: Self-pay | Admitting: Orthopedic Surgery

## 2013-09-07 VITALS — BP 148/84 | Ht 62.0 in | Wt 222.0 lb

## 2013-09-07 DIAGNOSIS — L0390 Cellulitis, unspecified: Secondary | ICD-10-CM

## 2013-09-07 DIAGNOSIS — L0291 Cutaneous abscess, unspecified: Secondary | ICD-10-CM

## 2013-09-07 DIAGNOSIS — Z9889 Other specified postprocedural states: Secondary | ICD-10-CM

## 2013-09-07 MED ORDER — OXYCODONE-ACETAMINOPHEN 5-325 MG PO TABS: 1.0000 | ORAL_TABLET | ORAL | Status: DC | PRN

## 2013-09-07 NOTE — Progress Notes (Signed)
Patient ID: [name], female   DOB: Apr 29, 1961, 53 y.o.   MRN: [id number] This is the 4 months postop visit status post reconstruction of a patellar tendon rupture postop total knee  Chief Complaint  Patient presents with  . Follow-up    Right total knee 28/76/8115, complicated by patella tendon rupture, patella tendon repair DOS 05/12/13   She complains of pain in heat in the right knee.   General appearance is normal, the patient is alert and oriented x3 with normal mood and affect. BP 148/84  Ht 5\' 2"  (1.575 m)  Wt 222 lb (100.699 kg)  BMI 40.59 kg/m2  LMP 01/15/2012 The knee looks good she has a 20 extensor lag she has 90 of flexion she has full passive range of motion she is in a postop range of motion brace  Recommend CBC sedimentation rate and C-reactive protein checked for infection, there is no joint effusion the knee did feel a little bit warm System review no fevers chills night sweats fatigue or malaise  Plan refill her pain medication continue bracing for 2 months return in one month go for laboratory testing.

## 2013-09-25 ENCOUNTER — Ambulatory Visit (HOSPITAL_COMMUNITY): Payer: Medicaid Other

## 2013-09-28 ENCOUNTER — Ambulatory Visit (HOSPITAL_COMMUNITY)
Admission: RE | Admit: 2013-09-28 | Discharge: 2013-09-28 | Disposition: A | Discharge status: Home / Self Care | Payer: Medicare Other | Source: Ambulatory Visit | Attending: Family Medicine | Admitting: Family Medicine

## 2013-09-28 DIAGNOSIS — Z139 Encounter for screening, unspecified: Secondary | ICD-10-CM

## 2013-09-28 DIAGNOSIS — Z1231 Encounter for screening mammogram for malignant neoplasm of breast: Secondary | ICD-10-CM | POA: Insufficient documentation

## 2013-09-29 LAB — CBC
HCT: 33.5 % — ABNORMAL LOW (ref 36.0–46.0)
Hemoglobin: 10.1 g/dL — ABNORMAL LOW (ref 12.0–15.0)
MCH: 22.7 pg — AB (ref 26.0–34.0)
MCHC: 30.1 g/dL (ref 30.0–36.0)
MCV: 75.5 fL — AB (ref 78.0–100.0)
PLATELETS: 554 10*3/uL — AB (ref 150–400)
RBC: 4.44 MIL/uL (ref 3.87–5.11)
RDW: 19.6 % — AB (ref 11.5–15.5)
WBC: 10.1 10*3/uL (ref 4.0–10.5)

## 2013-09-29 LAB — C-REACTIVE PROTEIN: CRP: 4.8 mg/dL — ABNORMAL HIGH (ref <0.60)

## 2013-09-29 LAB — SEDIMENTATION RATE: SED RATE: 71 mm/h — AB (ref 0–22)

## 2013-10-03 ENCOUNTER — Other Ambulatory Visit: Payer: Self-pay | Admitting: Family Medicine

## 2013-10-03 DIAGNOSIS — R928 Other abnormal and inconclusive findings on diagnostic imaging of breast: Secondary | ICD-10-CM

## 2013-10-10 ENCOUNTER — Ambulatory Visit (INDEPENDENT_AMBULATORY_CARE_PROVIDER_SITE_OTHER): Payer: Medicare Other | Admitting: Orthopedic Surgery

## 2013-10-10 ENCOUNTER — Encounter: Payer: Self-pay | Admitting: Orthopedic Surgery

## 2013-10-10 VITALS — BP 136/85 | Ht 62.0 in | Wt 222.0 lb

## 2013-10-10 DIAGNOSIS — T8149XA Infection following a procedure, other surgical site, initial encounter: Secondary | ICD-10-CM

## 2013-10-10 DIAGNOSIS — T8140XA Infection following a procedure, unspecified, initial encounter: Secondary | ICD-10-CM

## 2013-10-10 DIAGNOSIS — Z96651 Presence of right artificial knee joint: Secondary | ICD-10-CM | POA: Insufficient documentation

## 2013-10-10 DIAGNOSIS — M009 Pyogenic arthritis, unspecified: Secondary | ICD-10-CM

## 2013-10-10 DIAGNOSIS — Z9889 Other specified postprocedural states: Secondary | ICD-10-CM

## 2013-10-10 DIAGNOSIS — S86819A Strain of other muscle(s) and tendon(s) at lower leg level, unspecified leg, initial encounter: Secondary | ICD-10-CM | POA: Insufficient documentation

## 2013-10-10 DIAGNOSIS — Z96659 Presence of unspecified artificial knee joint: Secondary | ICD-10-CM

## 2013-10-10 DIAGNOSIS — S838X9A Sprain of other specified parts of unspecified knee, initial encounter: Secondary | ICD-10-CM

## 2013-10-10 DIAGNOSIS — L0390 Cellulitis, unspecified: Secondary | ICD-10-CM | POA: Insufficient documentation

## 2013-10-10 DIAGNOSIS — L0291 Cutaneous abscess, unspecified: Secondary | ICD-10-CM

## 2013-10-10 MED ORDER — OXYCODONE-ACETAMINOPHEN 5-325 MG PO TABS: 1.0000 | ORAL_TABLET | ORAL | Status: DC | PRN

## 2013-10-10 NOTE — Patient Instructions (Signed)
Go get labs drawn in the first of March

## 2013-10-10 NOTE — Progress Notes (Signed)
Patient ID: [name], female   DOB: 11/21/1960, 53 y.o.   MRN: [id number] Encounter Diagnoses  Name Primary?  . S/P knee surgery   . Cellulitis Yes  . History of knee replacement procedure of right knee   . Rupture patellar tendon   . Postoperative infection of knee     Chief Complaint  Patient presents with  . Follow-up    1 month recheck on right knee and review labs.     BP 136/85  Ht 5\' 2"  (1.575 m)  Wt 222 lb (100.699 kg)  BMI 40.59 kg/m2  LMP 01/15/2012  The patient had a routine total knee replacement did well. In the postop period she started doing squats and ruptured patellar tendon. 7 allograft reconstruction. She has warmth in the knee swelling and pain we sent her for lab tests and it showed that she had a white count of 10 sedimentation rate was 71 C-reactive protein of 4.8  Exam shows warmth to the knee without effusion.  I think she will need a knee aspiration but I do not see any fluid to aspirate someone to repeat the labs one more time if they're still elevated we will go ahead and do a sterile tap of the knee to see if can get any fluid back for culture  She has an allograft reconstruction of her patella tendon in place which is functioning well. Her passive range of motion is 0-90 her active motion is 20-90 and she is ambulatory with a hinged knee brace and a cane  Repeat labs in a month consider aspiration continue Percocet for pain

## 2013-10-18 ENCOUNTER — Encounter (HOSPITAL_COMMUNITY): Payer: Medicare Other

## 2013-11-02 ENCOUNTER — Encounter: Payer: Self-pay | Admitting: Orthopedic Surgery

## 2013-11-08 ENCOUNTER — Ambulatory Visit (HOSPITAL_COMMUNITY)
Admission: RE | Admit: 2013-11-08 | Discharge: 2013-11-08 | Disposition: A | Discharge status: Home / Self Care | Payer: Medicare Other | Source: Ambulatory Visit | Attending: Family Medicine | Admitting: Family Medicine

## 2013-11-08 ENCOUNTER — Other Ambulatory Visit: Payer: Self-pay | Admitting: Family Medicine

## 2013-11-08 DIAGNOSIS — R928 Other abnormal and inconclusive findings on diagnostic imaging of breast: Secondary | ICD-10-CM

## 2013-11-08 DIAGNOSIS — Z1231 Encounter for screening mammogram for malignant neoplasm of breast: Secondary | ICD-10-CM | POA: Insufficient documentation

## 2013-11-08 LAB — CBC
HCT: 33.7 % — ABNORMAL LOW (ref 36.0–46.0)
Hemoglobin: 10.7 g/dL — ABNORMAL LOW (ref 12.0–15.0)
MCH: 23.2 pg — AB (ref 26.0–34.0)
MCHC: 31.8 g/dL (ref 30.0–36.0)
MCV: 73.1 fL — AB (ref 78.0–100.0)
PLATELETS: 614 10*3/uL — AB (ref 150–400)
RBC: 4.61 MIL/uL (ref 3.87–5.11)
RDW: 18.6 % — AB (ref 11.5–15.5)
WBC: 10.7 10*3/uL — ABNORMAL HIGH (ref 4.0–10.5)

## 2013-11-08 LAB — C-REACTIVE PROTEIN: CRP: 4.1 mg/dL — ABNORMAL HIGH (ref <0.60)

## 2013-11-08 LAB — SEDIMENTATION RATE: SED RATE: 73 mm/h — AB (ref 0–22)

## 2013-11-23 ENCOUNTER — Ambulatory Visit (INDEPENDENT_AMBULATORY_CARE_PROVIDER_SITE_OTHER): Payer: Medicare Other | Admitting: Orthopedic Surgery

## 2013-11-23 ENCOUNTER — Ambulatory Visit (INDEPENDENT_AMBULATORY_CARE_PROVIDER_SITE_OTHER): Payer: Medicare Other

## 2013-11-23 ENCOUNTER — Encounter: Payer: Self-pay | Admitting: Orthopedic Surgery

## 2013-11-23 VITALS — BP 143/82 | Ht 62.0 in | Wt 222.0 lb

## 2013-11-23 DIAGNOSIS — Z9889 Other specified postprocedural states: Secondary | ICD-10-CM

## 2013-11-23 DIAGNOSIS — M25469 Effusion, unspecified knee: Secondary | ICD-10-CM

## 2013-11-23 DIAGNOSIS — Z96651 Presence of right artificial knee joint: Secondary | ICD-10-CM

## 2013-11-23 DIAGNOSIS — M25461 Effusion, right knee: Secondary | ICD-10-CM

## 2013-11-23 DIAGNOSIS — Z96659 Presence of unspecified artificial knee joint: Secondary | ICD-10-CM

## 2013-11-23 DIAGNOSIS — S86819A Strain of other muscle(s) and tendon(s) at lower leg level, unspecified leg, initial encounter: Secondary | ICD-10-CM

## 2013-11-23 DIAGNOSIS — S838X9A Sprain of other specified parts of unspecified knee, initial encounter: Secondary | ICD-10-CM

## 2013-11-23 DIAGNOSIS — T847XXA Infection and inflammatory reaction due to other internal orthopedic prosthetic devices, implants and grafts, initial encounter: Secondary | ICD-10-CM | POA: Insufficient documentation

## 2013-11-23 MED ORDER — OXYCODONE-ACETAMINOPHEN 5-325 MG PO TABS: 1.0000 | ORAL_TABLET | ORAL | Status: DC | PRN

## 2013-11-23 NOTE — Progress Notes (Signed)
Patient ID: [name], female   DOB: 28-Aug-1961, 53 y.o.   MRN: [id number]  Chief Complaint  Patient presents with  . Follow-up    6 week recheck on right knee and review labs. Aspirate knee. DOS 05-12-13.   The patient complains of continued pain swelling and warmth to the knee she had a second set of labs including CBC and sedimentation rate and C-reactive protein which showed elevation of the flaps. She comes in for aspiration. The aspiration shows cloudy fluid in the joint approximately 20 cc. The x-ray shows erosions  around the implants consistent with possible loosening secondary to infection.   Review of systems no fatigue or malaise No neurovascular complaints  General appearance is normal, the patient is alert and oriented x3 with normal mood and affect.  Gait is supported by bracing and she has a continued mild limp The joint remains stable in anterior posterior and medial lateral plane. Neurovascular exam is intact Clinical exam shows that her allograft healed well she has a 5 extensor lag on straight leg raise and has approximately 95 of flexion. However the knee is warm and tender to touch  She will be returned to [place] in a week to review the fluid aspiration and then we will make plans for revision. I will most likely send her to a joint replacement specialist in the setting of a total knee complicated by patellar tendon rupture , repaired with allograft complicated by infection if lab results are positive.  Encounter Diagnoses  Name Primary?  . Fluid in right knee joint Yes  . Rupture patellar tendon   . History of knee replacement procedure of right knee   . Infected orthopedic implant

## 2013-11-23 NOTE — Patient Instructions (Signed)
Return 1 week to review results of the knee aspiration

## 2013-11-24 LAB — SYNOVIAL CELL COUNT + DIFF, W/ CRYSTALS
CRYSTALS FLUID: NONE SEEN
Eosinophils-Synovial: 0 % (ref 0–1)
LYMPHOCYTES-SYNOVIAL FLD: 7 % (ref 0–20)
Monocyte/Macrophage: 0 % — ABNORMAL LOW (ref 50–90)
NEUTROPHIL, SYNOVIAL: 93 % — AB (ref 0–25)
WBC, SYNOVIAL: 39350 uL — AB (ref 0–200)

## 2013-11-26 LAB — BODY FLUID CULTURE: Gram Stain: NONE SEEN

## 2013-11-27 ENCOUNTER — Telehealth: Payer: Self-pay | Admitting: Orthopedic Surgery

## 2013-11-27 NOTE — Telephone Encounter (Signed)
Received message today from [facility], with [employer], and took a verbal report and gave it to Dr. Aline Brochure. She also said she would fax report.

## 2013-11-27 NOTE — Telephone Encounter (Signed)
[name] left a message on Friday, 11/24/13 that she received the fluid on [name], but needs to know the source Did not leave a phone number

## 2013-11-30 ENCOUNTER — Telehealth: Payer: Self-pay | Admitting: Orthopedic Surgery

## 2013-11-30 ENCOUNTER — Ambulatory Visit (INDEPENDENT_AMBULATORY_CARE_PROVIDER_SITE_OTHER): Payer: Medicare Other | Admitting: Orthopedic Surgery

## 2013-11-30 VITALS — BP 133/83 | Ht 62.0 in | Wt 222.0 lb

## 2013-11-30 DIAGNOSIS — T847XXA Infection and inflammatory reaction due to other internal orthopedic prosthetic devices, implants and grafts, initial encounter: Secondary | ICD-10-CM

## 2013-11-30 NOTE — Progress Notes (Signed)
Patient ID: [name], female   DOB: 1960-10-15, 53 y.o.   MRN: [id number]  Chief Complaint  Patient presents with  . Follow-up    Review labs and aspirate right knee   History of right total knee complicated by right patellar tendon rupture secondary to aggressive rehabilitation, patient did squat patella tendon rupture. This was treated with Achilles tendon allograft. Did well regain flexion of 105-110. Complains of pain swelling warmth right knee  System review no fatigue or chills at this time  Vitals are stable. She's walking with a cane in a hinged knee brace. She's awake alert and oriented x3 mood and affect are normal The knee is warm to touch the incision is healed with no drainage The knee is stable Neurovascular exam intact  LaFlexion ARC is 120-1b results  Knee aspirate revealed the following cell count Cell count was reported as 3002nd cell count reports at 39,000 white cells 93% neutrophils no crystals     Culture revealed the following bacteria Staphylococcus species coagulase-negative Penicillin resistant oxacillin sensitive with Clindamycin less than 0.25 erythromycin less than 0.25 Levaquin less than 0.25, gentamicin less than 0.5 Cipro less than 0.5 rifampin less than 0.5 tetracycline resistant greater than 16 vancomycin 1 Bactrim less than 10   The lab findings are discussed with the patient. We have discussed the need for revision surgery. I will make the appropriate arrangements for referral for two-stage exchange arthroplasty

## 2013-11-30 NOTE — Patient Instructions (Signed)
Refer to Dr. Alvan Dame or [facility] periprosthetic infection

## 2013-11-30 NOTE — Telephone Encounter (Signed)
Faxed referral and medical records for 2nd opinion orthopaedic evaluation, to [facility], Dr. Wynelle Link or Dr. Alvan Dame, to Fax  (confirmed with their office, [place]) [phone] (11/30/13 4:30p.m.)  Awaiting appointment; patient aware of status.

## 2013-12-12 ENCOUNTER — Telehealth: Payer: Self-pay | Admitting: *Deleted

## 2013-12-12 NOTE — Telephone Encounter (Signed)
Make referral to [facility] please Call them to make the appointment see what the most expeditious way to do it is

## 2013-12-12 NOTE — Telephone Encounter (Signed)
Patient called and stated that [facility] has refused to see her. We have not received any information regarding this. I called [employer], and was informed that Dr. Wynelle Link has declined to see the patient. Patient is asking what she needs to so? Please advise.

## 2013-12-13 NOTE — Telephone Encounter (Signed)
Records were faxed to [facility]. 12/13/13.to fax # [phone].

## 2013-12-13 NOTE — Telephone Encounter (Signed)
Scheduled an appointment with [facility], Dr Rosanne Ashing, for 12/21/13 at 9:30 am. Patient was given date and time of appointment and advised to take films, photo ID, and Insurance cards. Patient requested and gave me verbal permission to discuss all this with Betsy Coder, who helps take care of [name]. I explained all this to Ms. Ledbetter.

## 2013-12-25 ENCOUNTER — Telehealth: Payer: Self-pay | Admitting: Orthopedic Surgery

## 2013-12-25 NOTE — Telephone Encounter (Signed)
Patient called this morning, 12/25/13, requesting refill of pain medication, oxyCODONE-acetaminophen (PERCOCET/ROXICET) 5-325 MG per tablet.  States she had gone to the scheduled referral appointment at [facility] with Dr. Rosanne Ashing, and states he is in the process of referring her to another specialist, and that she is to continue to request her medication refills from Dr Aline Brochure.  Dr. Aline Brochure may have personally spoken with this physician in regard to Ms. Clifton James.  Please advise.  Her ph# is [phone]

## 2013-12-25 NOTE — Telephone Encounter (Signed)
Approved.  

## 2013-12-25 NOTE — Telephone Encounter (Signed)
Routing to Dr Harrison 

## 2013-12-26 ENCOUNTER — Other Ambulatory Visit: Payer: Self-pay | Admitting: *Deleted

## 2013-12-26 DIAGNOSIS — Z9889 Other specified postprocedural states: Secondary | ICD-10-CM

## 2013-12-26 MED ORDER — OXYCODONE-ACETAMINOPHEN 5-325 MG PO TABS: 1.0000 | ORAL_TABLET | ORAL | Status: DC | PRN

## 2013-12-26 NOTE — Telephone Encounter (Signed)
Refilled per Dr. Aline Brochure, and patient notified prescription was ready to be picked up.

## 2014-01-23 ENCOUNTER — Ambulatory Visit (HOSPITAL_COMMUNITY)
Admission: RE | Admit: 2014-01-23 | Discharge: 2014-01-23 | Disposition: A | Discharge status: Home / Self Care | Payer: Medicare Other | Source: Ambulatory Visit | Attending: Internal Medicine | Admitting: Internal Medicine

## 2014-01-23 ENCOUNTER — Encounter (HOSPITAL_COMMUNITY)
Admission: RE | Admit: 2014-01-23 | Discharge: 2014-01-23 | Disposition: A | Discharge status: Home / Self Care | Payer: Medicare Other | Source: Ambulatory Visit | Attending: Internal Medicine | Admitting: Internal Medicine

## 2014-01-23 ENCOUNTER — Other Ambulatory Visit: Payer: Self-pay | Admitting: *Deleted

## 2014-01-23 DIAGNOSIS — Y831 Surgical operation with implant of artificial internal device as the cause of abnormal reaction of the patient, or of later complication, without mention of misadventure at the time of the procedure: Secondary | ICD-10-CM | POA: Insufficient documentation

## 2014-01-23 DIAGNOSIS — Z96659 Presence of unspecified artificial knee joint: Secondary | ICD-10-CM | POA: Insufficient documentation

## 2014-01-23 DIAGNOSIS — T8450XA Infection and inflammatory reaction due to unspecified internal joint prosthesis, initial encounter: Secondary | ICD-10-CM | POA: Insufficient documentation

## 2014-01-23 MED ORDER — SODIUM CHLORIDE 0.9 % IJ SOLN: 10.0000 mL | INTRAMUSCULAR | Status: DC | PRN

## 2014-01-23 MED ORDER — CEFAZOLIN SODIUM 1-5 GM-% IV SOLN
INTRAVENOUS | Status: AC
  Filled 2014-01-23: qty 50

## 2014-01-23 MED ORDER — SODIUM CHLORIDE 0.9 % IV SOLN
INTRAVENOUS | Status: DC
  Administered 2014-01-23: 12:00:00 via INTRAVENOUS

## 2014-01-23 MED ORDER — CEFAZOLIN SODIUM 1-5 GM-% IV SOLN
1.0000 g | Freq: Once | INTRAVENOUS | Status: AC
  Administered 2014-01-23: 1 g via INTRAVENOUS

## 2014-01-23 MED ORDER — DICLOFENAC POTASSIUM 50 MG PO TABS: 50.0000 mg | ORAL_TABLET | Freq: Two times a day (BID) | ORAL | Status: AC

## 2014-01-23 NOTE — Discharge Instructions (Signed)
Peripherally Inserted Central Catheter/Midline Placement  The IV Nurse has discussed with the patient and/or persons authorized to consent for the patient, the purpose of this procedure and the potential benefits and risks involved with this procedure.  The benefits include less needle sticks, lab draws from the catheter and patient may be discharged home with the catheter.  Risks include, but not limited to, infection, bleeding, blood clot (thrombus formation), and puncture of an artery; nerve damage and irregular heat beat.  Alternatives to this procedure were also discussed.  PICC/Midline Placement Documentation  PICC / Midline Single Lumen 70/26/37 PICC Left Basilic 40 cm 0 cm (Active)  Indication for Insertion or Continuance of Line Home intravenous therapies (PICC only) 01/23/2014 12:10 PM  Exposed Catheter (cm) 0 cm 01/23/2014 12:10 PM  Site Assessment Clean;Dry;Intact 01/23/2014 12:10 PM  Line Status Saline locked 01/23/2014 12:10 PM  Dressing Type Transparent;Securing device 01/23/2014 12:10 PM  Dressing Status Clean;Dry;Intact;Antimicrobial disc in place 01/23/2014 12:10 PM  Line Care Connections checked and tightened 01/23/2014 12:10 PM  Dressing Intervention Dressing changed;Antimicrobial disc changed 01/23/2014 12:10 PM  Dressing Change Due 01/30/14 01/23/2014 12:10 PM     PICC Insertion, Care After Refer to this sheet in the next few weeks. These instructions provide you with information on caring for yourself after your procedure. Your health care provider may also give you more specific instructions. Your treatment has been planned according to current medical practices, but problems sometimes occur. Call your health care provider if you have any problems or questions after your procedure. WHAT TO EXPECT AFTER THE PROCEDURE After your procedure, it is typical to have the following:  Mild discomfort at the insertion site. This should not last more than a day. HOME CARE  INSTRUCTIONS  Rest at home for the remainder of the day after the procedure.  You may bend your arm and move it freely. If your PICC is near or at the bend of your elbow, avoid activity with repeated motion at the elbow.  Avoid lifting heavy objects as instructed by your health care provider.  Avoid using a crutch with the arm on the same side as your PICC. You may need to use a walker. Bandage Care  Keep your PICC bandage (dressing) clean and dry to prevent infection.  Ask your health care provider when you may shower. To keep the dressing dry, cover the PICC with plastic wrap and tape before showering. If the dressing does become wet, replace it right after the shower.  Do not soak in the bath, swim, or use hot tubs when you have a PICC.  Change the PICC dressing as instructed by your health care provider.  Change your PICC dressing if it becomes loose or wet. General PICC Care  Check the PICC insertion site daily for leakage, redness, swelling, or pain.  Flush the PICC as directed by your health care provider. Let your health care provider know right away if the PICC is difficult to flush or does not flush. Do not use force to flush the PICC.   Do not use a syringe that is less than 10 mL to flush the PICC.  Never pull or tug on the PICC.  Avoid blood pressure checks on the arm with the PICC.  Keep your PICC identification card with you at all times.  Do not take the PICC out yourself. Only a trained health care professional should remove the PICC.  SEEK MEDICAL CARE IF:  You have pain in your arm, ear,  face, or teeth.  You have fever or chills.  You have drainage from the PICC insertion site.  You have redness or palpate a "cord" around the PICC insertion site.  You cannot flush the catheter. SEEK IMMEDIATE MEDICAL CARE IF:  You have swelling in the arm in which the PICC is inserted. Document Released: 06/07/2013 Document Reviewed: 04/24/2013 [facility] Patient  Information 2014 [place], [place].   [name] 01/23/2014, 12:27 PM

## 2014-01-23 NOTE — Progress Notes (Signed)
Peripherally Inserted Central Catheter/Midline Placement  The IV Nurse has discussed with the patient and/or persons authorized to consent for the patient, the purpose of this procedure and the potential benefits and risks involved with this procedure.  The benefits include less needle sticks, lab draws from the catheter and patient may be discharged home with the catheter.  Risks include, but not limited to, infection, bleeding, blood clot (thrombus formation), and puncture of an artery; nerve damage and irregular heat beat.  Alternatives to this procedure were also discussed. 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   If you have any questions please contact [facility] at [phone]   PICC/Midline Placement Documentation  PICC / Midline Single Lumen 88/41/66 PICC Left Basilic 40 cm 0 cm (Active)  Indication for Insertion or Continuance of Line Home intravenous therapies (PICC only) 01/23/2014 12:10 PM  Exposed Catheter (cm) 0 cm 01/23/2014 12:10 PM  Site Assessment Clean;Dry;Intact 01/23/2014 12:10 PM  Line Status Saline locked 01/23/2014 12:10 PM  Dressing Type Transparent;Securing device 01/23/2014 12:10 PM  Dressing Status Clean;Dry;Intact;Antimicrobial disc in place 01/23/2014 12:10 PM  Line Care Connections checked and tightened 01/23/2014 12:10 PM  Dressing Intervention Dressing changed;Antimicrobial disc changed 01/23/2014 12:10 PM  Dressing Change Due 01/30/14 01/23/2014 12:10 PM       Hillery Jacks 01/23/2014, 12:14 PM

## 2014-01-30 ENCOUNTER — Telehealth: Payer: Self-pay | Admitting: Orthopedic Surgery

## 2014-01-30 NOTE — Telephone Encounter (Signed)
Patient called and requested a refill on oxycodone.

## 2014-02-02 ENCOUNTER — Telehealth: Payer: Self-pay | Admitting: Orthopedic Surgery

## 2014-02-02 NOTE — Telephone Encounter (Signed)
Phone note may have been closed in error 01/31/14 per call from patient; she has called back today, Friday, 02/02/14, to ask for refill of her pain medication Oxycodone.  I inquired about her following up with [facility] per referral by Dr Aline Brochure for further care (I am not locating any reports in chart).  Patient states she is currently receiving "medication in the arm with IV" for "infection in the knee", but said no pain medication.  Please advise.  Patient would appreciate a call in any event.  Ph# [phone]

## 2014-02-05 ENCOUNTER — Other Ambulatory Visit: Payer: Self-pay | Admitting: *Deleted

## 2014-02-05 DIAGNOSIS — Z9889 Other specified postprocedural states: Secondary | ICD-10-CM

## 2014-02-05 MED ORDER — OXYCODONE-ACETAMINOPHEN 5-325 MG PO TABS: 1.0000 | ORAL_TABLET | ORAL | Status: DC | PRN

## 2014-02-05 NOTE — Telephone Encounter (Signed)
Routing to Dr Harrison 

## 2014-02-05 NOTE — Telephone Encounter (Signed)
yes

## 2014-02-05 NOTE — Telephone Encounter (Signed)
Patient informed prescription is ready for pick up.

## 2014-02-08 ENCOUNTER — Inpatient Hospital Stay (HOSPITAL_COMMUNITY): Admission: RE | Admit: 2014-02-08 | Payer: Medicare Other | Source: Ambulatory Visit

## 2014-02-09 ENCOUNTER — Ambulatory Visit (HOSPITAL_COMMUNITY)
Admission: RE | Admit: 2014-02-09 | Discharge: 2014-02-09 | Disposition: A | Discharge status: Home / Self Care | Payer: Medicare Other | Source: Ambulatory Visit | Attending: Internal Medicine | Admitting: Internal Medicine

## 2014-02-09 DIAGNOSIS — Z96659 Presence of unspecified artificial knee joint: Secondary | ICD-10-CM | POA: Insufficient documentation

## 2014-02-09 DIAGNOSIS — T8450XA Infection and inflammatory reaction due to unspecified internal joint prosthesis, initial encounter: Secondary | ICD-10-CM | POA: Insufficient documentation

## 2014-02-09 MED ORDER — SODIUM CHLORIDE 0.9 % IJ SOLN: 10.0000 mL | INTRAMUSCULAR | Status: DC | PRN

## 2014-02-09 MED ORDER — SODIUM CHLORIDE 0.9 % IJ SOLN: 10.0000 mL | Freq: Two times a day (BID) | INTRAMUSCULAR | Status: DC

## 2014-02-09 NOTE — Discharge Instructions (Signed)
PICC Home Guide A peripherally inserted central catheter (PICC) is a long, thin, flexible tube that is inserted into a vein in the upper arm. It is a form of intravenous (IV) access. It is considered to be a "central" line because the tip of the PICC ends in a large vein in your chest. This large vein is called the superior vena cava (SVC). The PICC tip ends in the SVC because there is a lot of blood flow in the SVC. This allows medicines and IV fluids to be quickly distributed throughout the body. The PICC is inserted using a sterile technique by a specially trained nurse or physician. After the PICC is inserted, a chest X-ray exam is done to be sure it is in the correct place.  A PICC may be placed for different reasons, such as:  To give medicines and liquid nutrition that can only be given through a central line. Examples are:  Certain antibiotic treatments.  Chemotherapy.  Total parenteral nutrition (TPN).  To take frequent blood samples.  To give IV fluids and blood products.  If there is difficulty placing a peripheral intravenous (PIV) catheter. If taken care of properly, a PICC can remain in place for several months. A PICC can also allow a person to go home from the hospital early. Medicine and PICC care can be managed at home by a family member or home health care team. WHAT PROBLEMS CAN HAPPEN WHEN I HAVE A PICC? Problems with a PICC can occasionally occur. These may include:  A blood clot (thrombus) forming in or at the tip of the PICC. This can cause the PICC to become clogged. A clot-dissolving medicine called tissue plasminogen activator (tPA) can be given through the PICC to help break up the clot.  Inflammation of the vein (phlebitis) in which the PICC is placed. Signs of inflammation may include redness, pain at the insertion site, red streaks, or being able to feel a "cord" in the vein where the PICC is located.  Infection in the PICC or at the insertion site. Signs of  infection may include fever, chills, redness, swelling, or pus drainage from the PICC insertion site.  PICC movement (malposition). The PICC tip may move from its original position due to excessive physical activity, forceful coughing, sneezing, or vomiting.  A break or cut in the PICC. It is important to not use scissors near the PICC.  Nerve or tendon irritation or injury during PICC insertion. WHAT SHOULD I KEEP IN MIND ABOUT ACTIVITIES WHEN I HAVE A PICC?  You may bend your arm and move it freely. If your PICC is near or at the bend of your elbow, avoid activity with repeated motion at the elbow.  Rest at home for the remainder of the day following PICC line insertion.  Avoid lifting heavy objects as instructed by your health care provider.  Avoid using a crutch with the arm on the same side as your PICC. You may need to use a walker. WHAT SHOULD I KNOW ABOUT MY PICC DRESSING?  Keep your PICC bandage (dressing) clean and dry to prevent infection.  Ask your health care provider when you may shower. Ask your health care provider to teach you how to wrap the PICC when you do take a shower.  Change the PICC dressing as instructed by your health care provider.  Change your PICC dressing if it becomes loose or wet. WHAT SHOULD I KNOW ABOUT PICC CARE?  Check the PICC insertion site daily for   leakage, redness, swelling, or pain.  Do not take a bath, swim, or use hot tubs when you have a PICC. Cover PICC line with clear plastic wrap and tape to keep it dry while showering.  Flush the PICC as directed by your health care provider. Let your health care provider know right away if the PICC is difficult to flush or does not flush. Do not use force to flush the PICC.  Do not use a syringe that is less than 10 mL to flush the PICC.  Never pull or tug on the PICC.  Avoid blood pressure checks on the arm with the PICC.  Keep your PICC identification card with you at all times.  Do not  take the PICC out yourself. Only a trained clinical professional should remove the PICC. SEEK IMMEDIATE MEDICAL CARE IF:  Your PICC is accidently pulled all the way out. If this happens, cover the insertion site with a bandage or gauze dressing. Do not throw the PICC away. Your health care provider will need to inspect it.  Your PICC was tugged or pulled and has partially come out. Do not  push the PICC back in.  There is any type of drainage, redness, or swelling where the PICC enters the skin.  You cannot flush the PICC, it is difficult to flush, or the PICC leaks around the insertion site when it is flushed.  You hear a "flushing" sound when the PICC is flushed.  You have pain, discomfort, or numbness in your arm, shoulder, or jaw on the same side as the PICC.  You feel your heart "racing" or skipping beats.  You notice a hole or tear in the PICC.  You develop chills or a fever. MAKE SURE YOU:   Understand these instructions.  Will watch your condition.  Will get help right away if you are not doing well or get worse. Document Released: 02/21/2003 Document Revised: 06/07/2013 Document Reviewed: 04/24/2013 [employer] Patient Information 2014 [employer].  

## 2014-02-09 NOTE — Progress Notes (Signed)
Peripherally Inserted Central Catheter/Midline Placement  The IV Nurse has discussed with the patient and/or persons authorized to consent for the patient, the purpose of this procedure and the potential benefits and risks involved with this procedure.  The benefits include less needle sticks, lab draws from the catheter and patient may be discharged home with the catheter.  Risks include, but not limited to, infection, bleeding, blood clot (thrombus formation), and puncture of an artery; nerve damage and irregular heat beat.  Alternatives to this procedure were also discussed.  PICC/Midline Placement Documentation  PICC / Midline Single Lumen 67/12/45 PICC Left Basilic 40 cm 4 cm (Active)  Indication for Insertion or Continuance of Line Home intravenous therapies (PICC only) 01/23/2014 12:10 PM  Exposed Catheter (cm) 0 cm 01/23/2014 12:10 PM  Site Assessment Clean;Dry;Intact 01/23/2014 12:10 PM  Line Status Saline locked 01/23/2014 12:10 PM  Dressing Type Transparent;Securing device 01/23/2014 12:10 PM  Dressing Status Clean;Dry;Intact;Antimicrobial disc in place 01/23/2014 12:10 PM  Line Care Connections checked and tightened 01/23/2014 12:10 PM  Dressing Intervention Dressing changed;Antimicrobial disc changed 01/23/2014 12:10 PM  Dressing Change Due 01/30/14 01/23/2014 12:10 PM     PICC / Midline Single Lumen 02/09/14 PICC Right Brachial 43 cm 0 cm (Active)  Indication for Insertion or Continuance of Line Administration of hyperosmolar/irritating solutions (i.e. TPN, Vancomycin, etc.);Home intravenous therapies (PICC only) 02/09/2014  1:10 PM  Exposed Catheter (cm) 0 cm 02/09/2014  1:10 PM  Site Assessment Clean;Dry;Intact 02/09/2014  1:10 PM  Line Status Flushed;Saline locked;Capped (central line);Blood return noted 02/09/2014  1:10 PM  Dressing Type Transparent 02/09/2014  1:10 PM  Dressing Status Clean;Dry;Intact 02/09/2014  1:10 PM   Patient arived at 1230 for picc line placement for prior picc  being pulled out over half way. Tolerated well.  Given discharge instructions and copy of ecg stating ok to use picc.     Roselind Messier 02/09/2014, 3:03 PM

## 2014-02-12 ENCOUNTER — Other Ambulatory Visit: Payer: Self-pay | Admitting: Obstetrics & Gynecology

## 2014-02-28 ENCOUNTER — Telehealth: Payer: Self-pay | Admitting: *Deleted

## 2014-02-28 ENCOUNTER — Other Ambulatory Visit: Payer: Self-pay | Admitting: Orthopedic Surgery

## 2014-02-28 MED ORDER — OXYCODONE-ACETAMINOPHEN 7.5-325 MG PO TABS: 1.0000 | ORAL_TABLET | ORAL | Status: DC | PRN

## 2014-02-28 NOTE — Telephone Encounter (Signed)
Mary from [facility] called and advised that Dr. Elicia Lamp is starting patient on Rifapin and states Dr Aline Brochure may need to increase Oxycodone dose   Call back # [phone] opt 1

## 2014-03-13 ENCOUNTER — Telehealth: Payer: Self-pay | Admitting: Orthopedic Surgery

## 2014-03-13 NOTE — Telephone Encounter (Signed)
Patient called to request medication refill on her pain medication, Oxycodone - initially spoke with Renee, and I had picked up line, and tried to assist - patient was abrupt on phone, and was raising her voice.  When I asked her to try to be calm so that I can try to understand what she is needing - she explained that we should have a report from [facility] which should indicate a possible change of Oxycodone dosage, due to a new medication she is on per Dr. Lilyan Gilford, called Rifampin.  (As noted in phone note of 02/28/14)  Patient is asking for Dr. Aline Brochure to write her the pain medication prescription that he would recommend.  I explained to patient that Dr Aline Brochure is out this week, and that we would check on the report, and also, request the medication upon Dr Harrison's return, as soon as possible.  Patient's phone # is [phone]. *  * Note, report has been received from [facility], Dr. Lilyan Gilford, states as patient relayed   That she had started patient on Rifampin, and that Dr Aline Brochure "may need to change Oxycodone dosage while on Rifampin."  Report also indicates that "the IV antibiotic is being continued at least through 03/15/14, but may extend it to 03/22/14, depending on how patient is feeling."     Please advise regarding pain medication.

## 2014-03-13 NOTE — Telephone Encounter (Signed)
The chart shows 7.5mg  script was entered and printed but was not provided to give to patient?

## 2014-03-17 NOTE — Telephone Encounter (Signed)
OK JUST FIX IT HOWEVER ITS SO SUPPOSED TO BE

## 2014-03-19 ENCOUNTER — Other Ambulatory Visit: Payer: Self-pay | Admitting: *Deleted

## 2014-03-19 MED ORDER — OXYCODONE-ACETAMINOPHEN 7.5-325 MG PO TABS: 1.0000 | ORAL_TABLET | ORAL | Status: DC | PRN

## 2014-03-19 NOTE — Telephone Encounter (Signed)
Patient aware script available for pick up

## 2014-05-02 ENCOUNTER — Telehealth: Payer: Self-pay | Admitting: Orthopedic Surgery

## 2014-05-02 NOTE — Telephone Encounter (Signed)
[name] asked for a prescription for Oxycodone 5/325  for her knee pain.  She saw Dr. Charlean Merl at [facility] 04/17/14.  Dr. Charlean Merl does not prescribe pain medicine, she only prescribed Cephalexin 500 mg for the infection in her knee.

## 2014-05-03 ENCOUNTER — Other Ambulatory Visit: Payer: Self-pay | Admitting: *Deleted

## 2014-05-03 DIAGNOSIS — Z9889 Other specified postprocedural states: Secondary | ICD-10-CM

## 2014-05-03 MED ORDER — OXYCODONE-ACETAMINOPHEN 5-325 MG PO TABS: 1.0000 | ORAL_TABLET | ORAL | Status: DC | PRN

## 2014-05-03 NOTE — Telephone Encounter (Signed)
Prescription available and patient aware

## 2014-06-12 ENCOUNTER — Other Ambulatory Visit: Payer: Self-pay | Admitting: *Deleted

## 2014-06-12 ENCOUNTER — Telehealth: Payer: Self-pay | Admitting: Orthopedic Surgery

## 2014-06-12 DIAGNOSIS — Z9889 Other specified postprocedural states: Secondary | ICD-10-CM

## 2014-06-12 MED ORDER — OXYCODONE-ACETAMINOPHEN 5-325 MG PO TABS: 1.0000 | ORAL_TABLET | ORAL | Status: DC | PRN

## 2014-06-12 NOTE — Telephone Encounter (Signed)
Prescription available for pick up, patient aware

## 2014-06-12 NOTE — Telephone Encounter (Signed)
Patient is calling asking for a refill on pain medication oxyCODONE-acetaminophen (PERCOCET/ROXICET) 5-325 MG Please advise

## 2014-06-13 NOTE — Telephone Encounter (Signed)
Patient picked up Rx

## 2014-07-02 ENCOUNTER — Encounter: Payer: Self-pay | Admitting: Orthopedic Surgery

## 2014-07-19 ENCOUNTER — Other Ambulatory Visit: Payer: Self-pay | Admitting: *Deleted

## 2014-07-19 ENCOUNTER — Telehealth: Payer: Self-pay | Admitting: Orthopedic Surgery

## 2014-07-19 DIAGNOSIS — Z9889 Other specified postprocedural states: Secondary | ICD-10-CM

## 2014-07-19 MED ORDER — OXYCODONE-ACETAMINOPHEN 5-325 MG PO TABS: 1.0000 | ORAL_TABLET | ORAL | Status: DC | PRN

## 2014-07-19 NOTE — Telephone Encounter (Signed)
Patient is calling asking for a refill on her pain medication oxyCODONE-acetaminophen (PERCOCET) 7.5-325 MG per tablet [[id number]] please advise?

## 2014-07-23 NOTE — Telephone Encounter (Signed)
Patient picked up Rx

## 2014-08-16 IMAGING — MG MM DIGITAL SCREENING BILAT
4 series · 4 of 4 positions shown · non-contrast
Comparison: Previous exams.

CLINICAL DATA: Screening.

DIGITAL BILATERAL SCREENING MAMMOGRAM WITH CAD

[L CC]
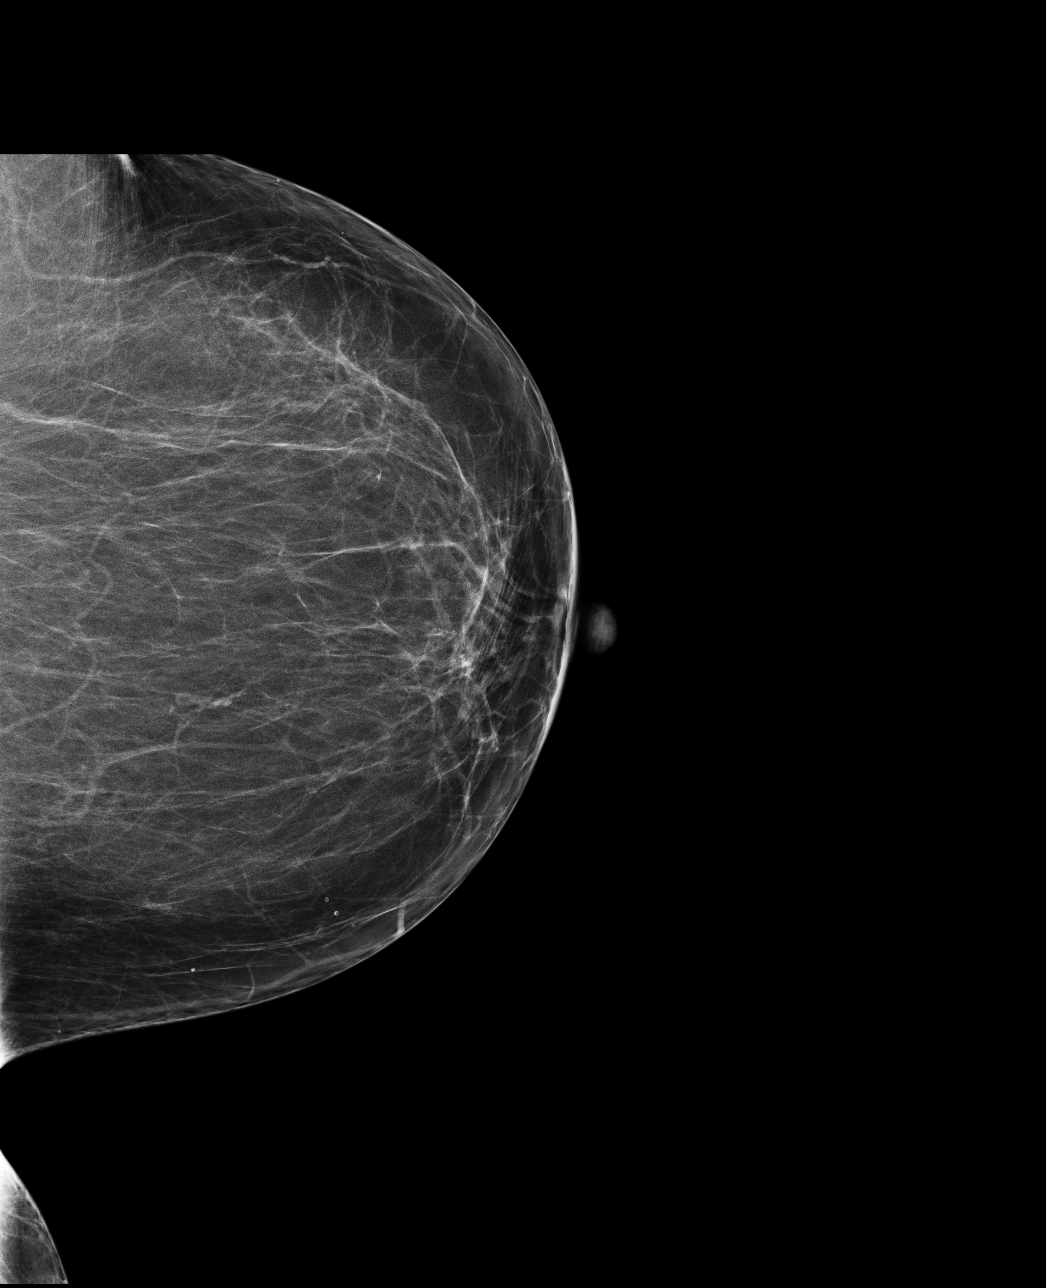

[L MLO]
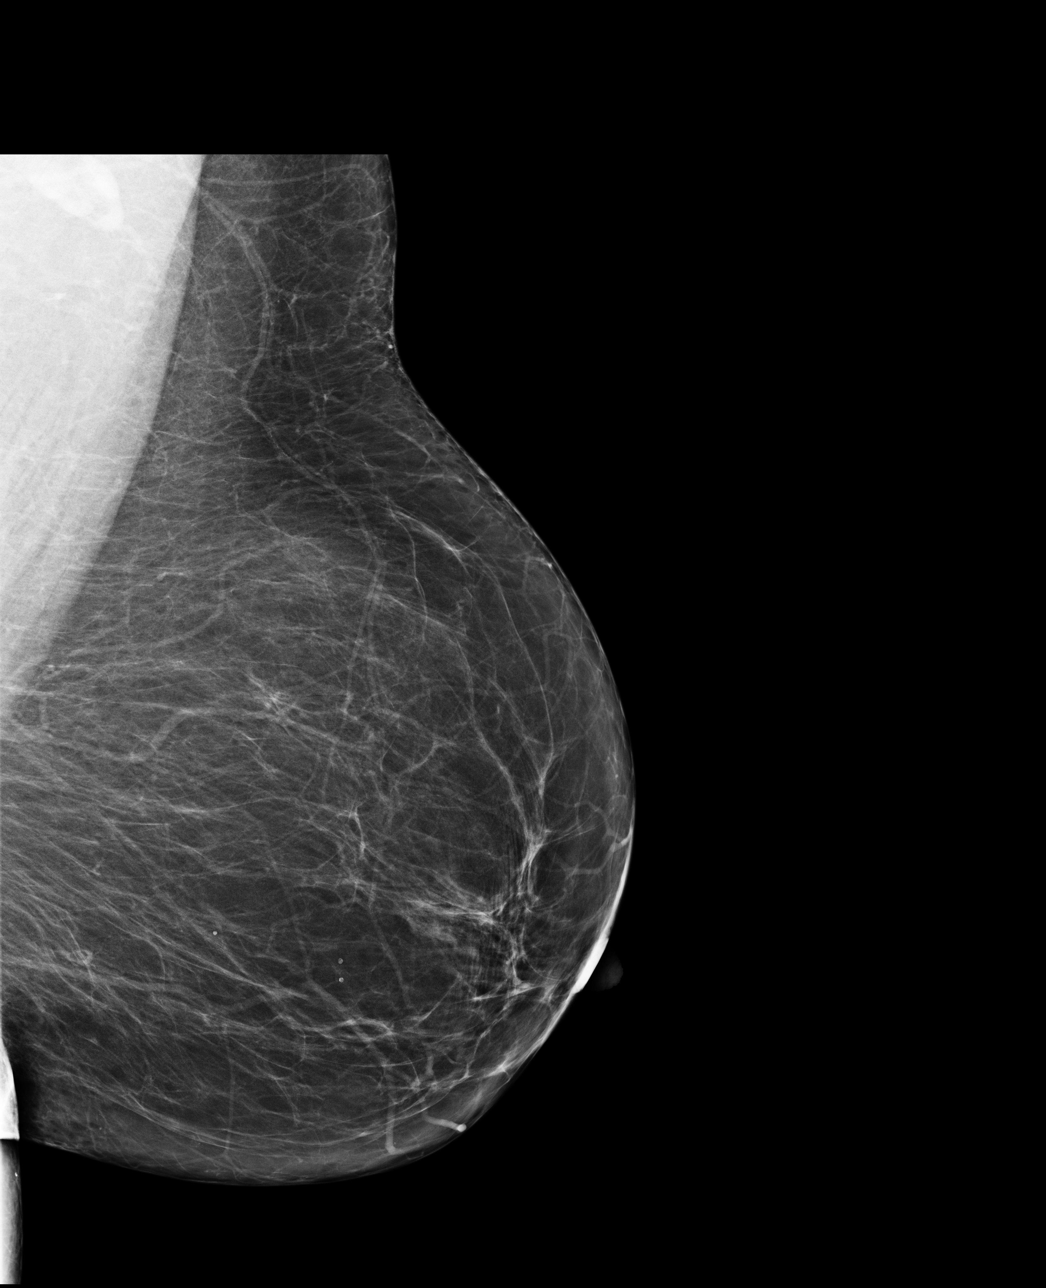

[R CC]
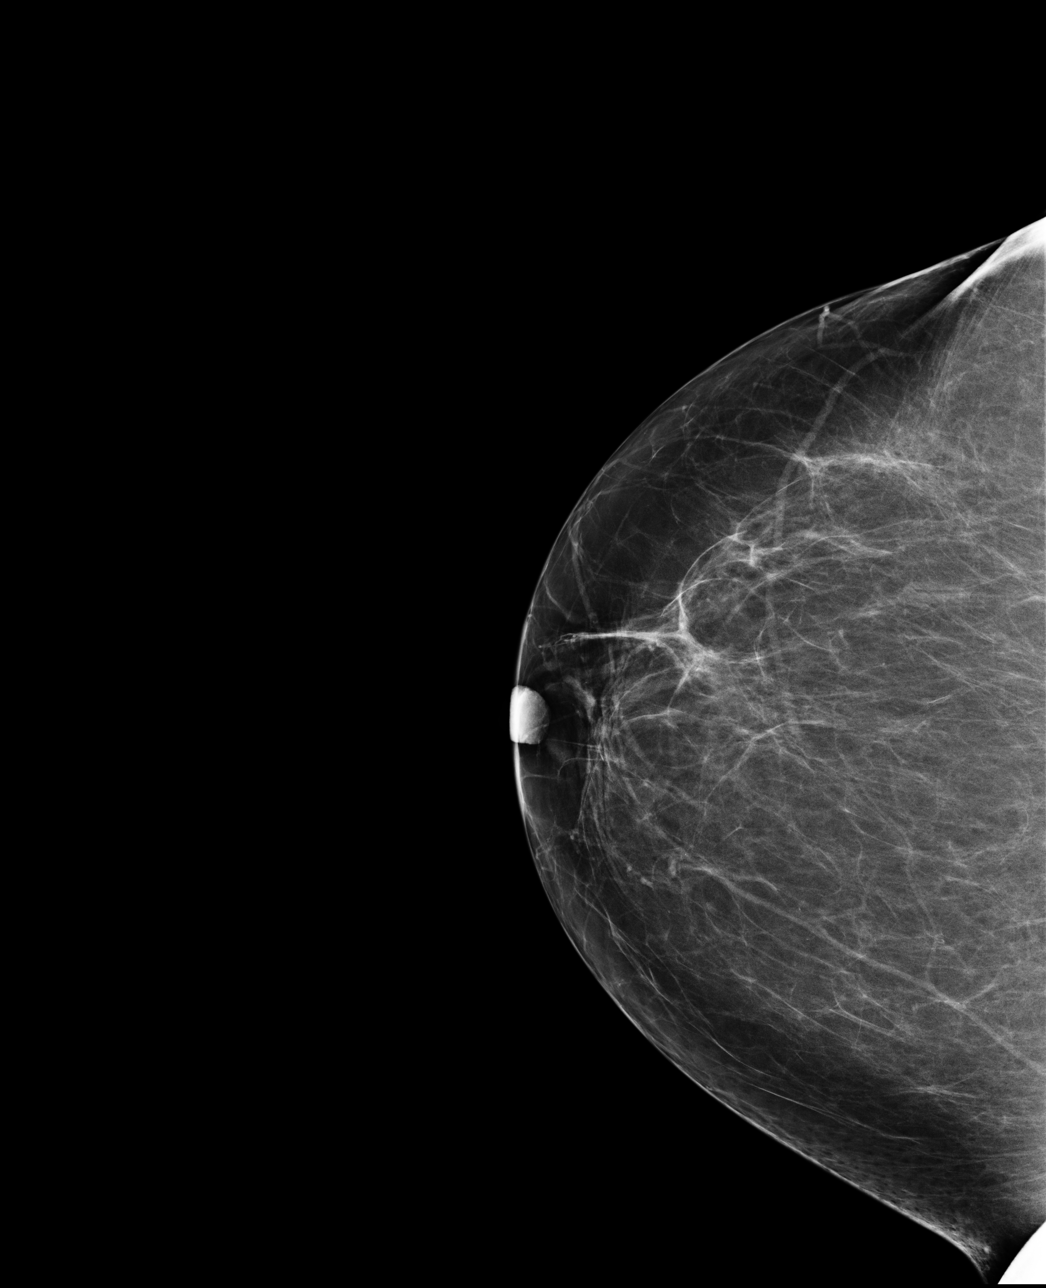

[R MLO]
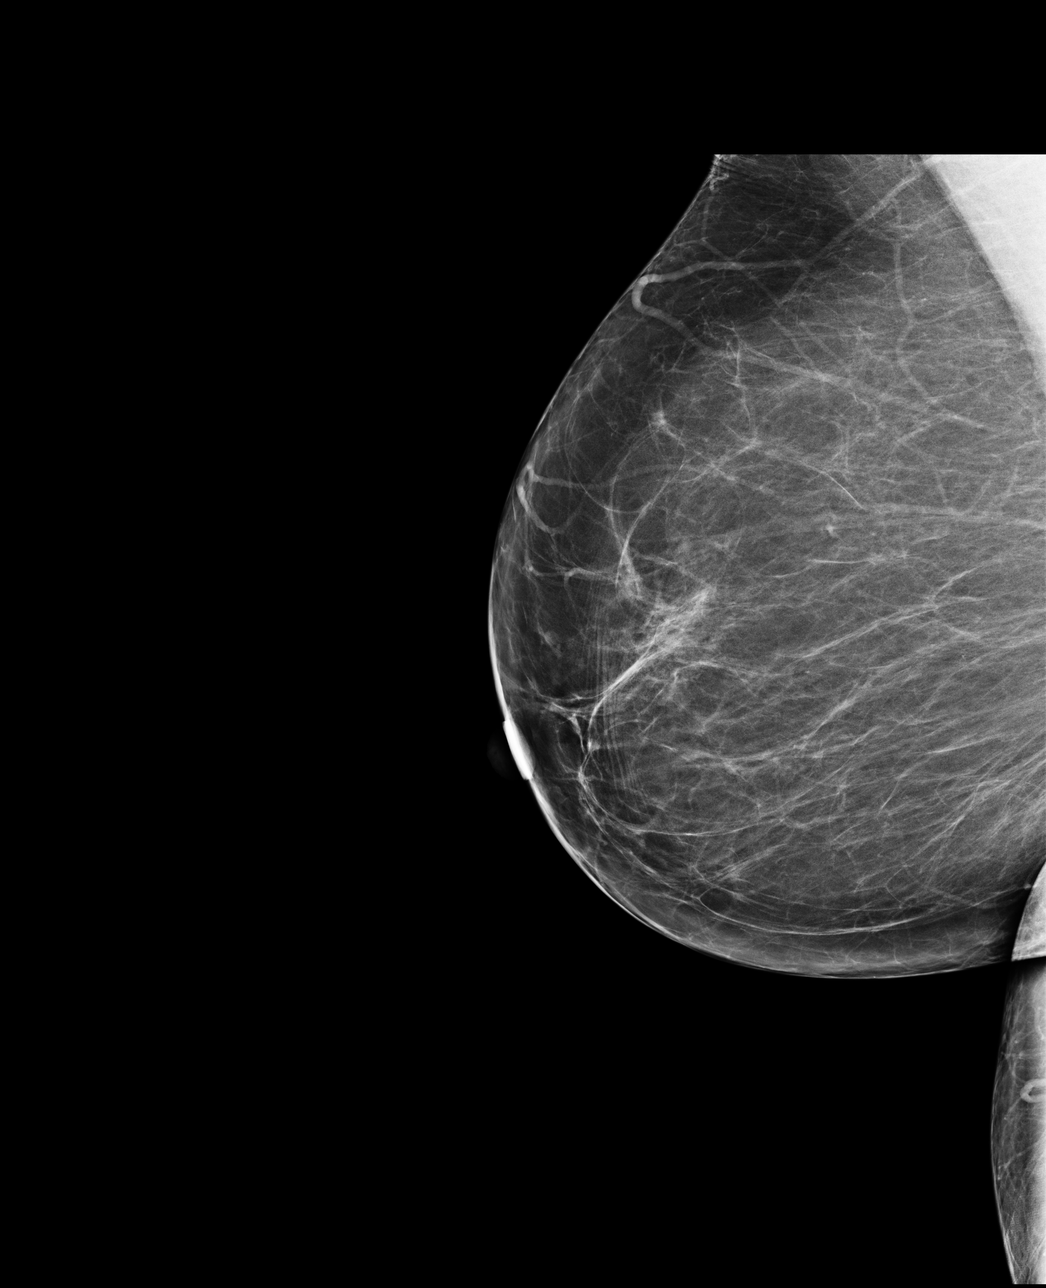

[4 of 4 positions shown; findings below may reference images not displayed]

FINDINGS: ACR Breast Density Category 2: There is a scattered fibroglandular
pattern.

No suspicious masses, architectural distortion, or calcifications
are present.

Images were processed with CAD.
IMPRESSION: No mammographic evidence of malignancy.

A result letter of this screening mammogram will be mailed directly
to the patient.

RECOMMENDATION:
Screening mammogram in one year. (Code:[id number])

BI-RADS CATEGORY 1:  Negative.

## 2014-08-21 ENCOUNTER — Telehealth: Payer: Self-pay | Admitting: Orthopedic Surgery

## 2014-08-21 ENCOUNTER — Other Ambulatory Visit: Payer: Medicaid Other | Admitting: Obstetrics & Gynecology

## 2014-08-21 ENCOUNTER — Other Ambulatory Visit: Payer: Self-pay | Admitting: *Deleted

## 2014-08-21 DIAGNOSIS — Z9889 Other specified postprocedural states: Secondary | ICD-10-CM

## 2014-08-21 MED ORDER — OXYCODONE-ACETAMINOPHEN 5-325 MG PO TABS: 1.0000 | ORAL_TABLET | ORAL | Status: DC | PRN

## 2014-08-21 NOTE — Telephone Encounter (Signed)
Prescription available, called patient, no answer 

## 2014-08-21 NOTE — Telephone Encounter (Signed)
Patient picked up prescription.

## 2014-08-21 NOTE — Telephone Encounter (Signed)
Patient called to request refill, pain medication: oxyCODONE-acetaminophen (PERCOCET/ROXICET) 5-325 MG per tablet [[id number]]  Her last appointment here 11/30/13.  Appears she is due for yearly total knee Xrays - please advise. Her phone# is [phone]

## 2014-08-27 ENCOUNTER — Inpatient Hospital Stay (HOSPITAL_COMMUNITY): Admission: RE | Admit: 2014-08-27 | Payer: Medicare Other | Source: Ambulatory Visit

## 2014-08-28 ENCOUNTER — Other Ambulatory Visit (HOSPITAL_COMMUNITY)
Admission: RE | Admit: 2014-08-28 | Discharge: 2014-08-28 | Disposition: A | Discharge status: Home / Self Care | Payer: Medicare Other | Source: Ambulatory Visit | Attending: Obstetrics & Gynecology | Admitting: Obstetrics & Gynecology

## 2014-08-28 ENCOUNTER — Encounter: Payer: Self-pay | Admitting: Obstetrics & Gynecology

## 2014-08-28 ENCOUNTER — Ambulatory Visit (INDEPENDENT_AMBULATORY_CARE_PROVIDER_SITE_OTHER): Payer: Medicare Other | Admitting: Obstetrics & Gynecology

## 2014-08-28 VITALS — BP 140/80 | Ht 62.0 in | Wt 237.0 lb

## 2014-08-28 DIAGNOSIS — Z1212 Encounter for screening for malignant neoplasm of rectum: Secondary | ICD-10-CM

## 2014-08-28 DIAGNOSIS — Z1211 Encounter for screening for malignant neoplasm of colon: Secondary | ICD-10-CM

## 2014-08-28 DIAGNOSIS — Z01419 Encounter for gynecological examination (general) (routine) without abnormal findings: Secondary | ICD-10-CM

## 2014-08-28 DIAGNOSIS — Z01411 Encounter for gynecological examination (general) (routine) with abnormal findings: Secondary | ICD-10-CM | POA: Insufficient documentation

## 2014-08-28 MED ORDER — MEGESTROL ACETATE 40 MG PO TABS: 40.0000 mg | ORAL_TABLET | Freq: Every day | ORAL | Status: DC

## 2014-08-28 NOTE — Progress Notes (Signed)
Patient ID: [name], female   DOB: [birth year], 53 y.o.   MRN: [id number] Subjective:     [name] is a 53 y.o. female here for a routine exam.  Patient's last menstrual period was 01/15/2012. [id number] Birth Control Method:  megestrol Menstrual Calendar(currently): amenorrheic on megestrol  Current complaints: right knee.   Current acute medical issues:  Right knee   Recent Gynecologic History Patient's last menstrual period was 01/15/2012. Last Pap: 2014,  normal Last mammogram: 10/2103,  Compression normal  Past Medical History  Diagnosis Date  . Eczema   . Muscle spasm of back   . Shortness of breath     with exertion   . Arthritis     Past Surgical History  Procedure Laterality Date  . No past surgeries    . Lipoma excision  11/16/2011    Procedure: EXCISION LIPOMA;  Surgeon: Scherry Ran, MD;  Location: [facility];  Service: General;  Laterality: Left;  Excision of neoplasm left shoulder  . Tubal ligation      and burned per patient.   . Multiple extractions with alveoloplasty  12/28/2011    Procedure: MULTIPLE EXTRACION WITH ALVEOLOPLASTY;  Surgeon: Gae Bon, DDS;  Location: [place];  Service: Oral Surgery;  Laterality: Bilateral;  . Mouth surgery    . Shoulder surgery Left   . 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 Joint replacement      r knee  . Patellar tendon repair Right 05/12/2013    Procedure: PATELLA TENDON REPAIR AND ALLOGRAFT RECONSTRUCTION;  Surgeon: Carole Civil, MD;  Location: [facility];  Service: Orthopedics;  Laterality: Right;  . Total knee arthroplasty Right 05/12/2013    Procedure: POLY EXCHANGE;  Surgeon: Carole Civil, MD;  Location: [facility];  Service: Orthopedics;  Laterality: Right;  . Irrigation and debridement knee Right 05/12/2013    Procedure: IRRIGATION AND DEBRIDEMENT  KNEE;  Surgeon: Carole Civil, MD;  Location: [facility];  Service: Orthopedics;  Laterality: Right;    OB History    Gravida Para Term Preterm AB TAB SAB Ectopic Multiple Living   5 5 5      1 6       History   Social History  . Marital Status: Single    Spouse Name: N/A    Number of Children: N/A  . Years of Education: 12   Occupational History  .     Social History Main Topics  . Smoking status: Former Smoker -- 0.10 packs/day for 1 years    Types: Cigarettes  . Smokeless tobacco: Never Used  . Alcohol Use: No  . Drug Use: No  . Sexual Activity: Yes   Other Topics Concern  . None   Social History Narrative    Family History  Problem Relation Age of Onset  . Arthritis    . Anesthesia problems Neg Hx   . Hypotension Neg Hx   . Malignant hyperthermia Neg Hx   . Pseudochol deficiency Neg Hx   . Diabetes Brother     Current outpatient prescriptions: cephALEXin (KEFLEX) 500 MG capsule, Take 500 mg by mouth 4 (four) times daily., Disp: , Rfl: ;  clobetasol cream (TEMOVATE) 3.23 %, Apply 1 application topically 2 (two) times daily as needed (rash)., Disp: , Rfl: ;  ketoconazole (NIZORAL) 2 % cream, Apply 1 application  topically daily., Disp: , Rfl:  oxyCODONE-acetaminophen (PERCOCET/ROXICET) 5-325 MG per tablet, Take 1 tablet by mouth every 4 (four) hours as needed., Disp: 180 tablet, Rfl: 0;  travoprost, benzalkonium, (TRAVATAN) 0.004 % ophthalmic solution, 1 drop at bedtime., Disp: , Rfl: ;  zolpidem (AMBIEN) 10 MG tablet, Take 10 mg by mouth at bedtime as needed for sleep., Disp: , Rfl:  aspirin EC 325 MG EC tablet, Take 1 tablet (325 mg total) by mouth 2 (two) times daily. (Patient not taking: Reported on 08/28/2014), Disp: 60 tablet, Rfl: 0;  diclofenac (CATAFLAM) 50 MG tablet, Take 1 tablet (50 mg total) by mouth 2 (two) times daily. (Patient not taking: Reported on 08/28/2014), Disp: 60 tablet, Rfl: 5 diphenhydrAMINE (BENADRYL) 25 mg capsule, Take 25 mg by mouth every  6 (six) hours as needed for itching or allergies., Disp: , Rfl: ;  megestrol (MEGACE) 40 MG tablet, TAKE 1 TABLET BY MOUTH ONCE DAILY. (Patient not taking: Reported on 08/28/2014), Disp: 30 tablet, Rfl: 6 oxyCODONE-acetaminophen (PERCOCET) 7.5-325 MG per tablet, Take 1 tablet by mouth every 4 (four) hours as needed for pain. (Patient not taking: Reported on 08/28/2014), Disp: 180 tablet, Rfl: 0  Review of Systems  Review of Systems  Constitutional: Negative for fever, chills, weight loss, malaise/fatigue and diaphoresis.  HENT: Negative for hearing loss, ear pain, nosebleeds, congestion, sore throat, neck pain, tinnitus and ear discharge.   Eyes: Negative for blurred vision, double vision, photophobia, pain, discharge and redness.  Respiratory: Negative for cough, hemoptysis, sputum production, shortness of breath, wheezing and stridor.   Cardiovascular: Negative for chest pain, palpitations, orthopnea, claudication, leg swelling and PND.  Gastrointestinal: negative for abdominal pain. Negative for heartburn, nausea, vomiting, diarrhea, constipation, blood in stool and melena.  Genitourinary: Negative for dysuria, urgency, frequency, hematuria and flank pain.  Musculoskeletal: Negative for myalgias, back pain, joint pain and falls.  Skin: Negative for itching and rash.  Neurological: Negative for dizziness, tingling, tremors, sensory change, speech change, focal weakness, seizures, loss of consciousness, weakness and headaches.  Endo/Heme/Allergies: Negative for environmental allergies and polydipsia. Does not bruise/bleed easily.  Psychiatric/Behavioral: Negative for depression, suicidal ideas, hallucinations, memory loss and substance abuse. The patient is not nervous/anxious and does not have insomnia.        Objective:  Blood pressure 140/80, height 5\' 2"  (1.575 m), weight 237 lb (107.502 kg), last menstrual period 01/15/2012.   Physical Exam  Vitals reviewed. Constitutional: She is  oriented to person, place, and time. She appears well-developed and well-nourished.  HENT:  Head: Normocephalic and atraumatic.        Right Ear: External ear normal.  Left Ear: External ear normal.  Nose: Nose normal.  Mouth/Throat: Oropharynx is clear and moist.  Eyes: Conjunctivae and EOM are normal. Pupils are equal, round, and reactive to light. Right eye exhibits no discharge. Left eye exhibits no discharge. No scleral icterus.  Neck: Normal range of motion. Neck supple. No tracheal deviation present. No thyromegaly present.  Cardiovascular: Normal rate, regular rhythm, normal heart sounds and intact distal pulses.  Exam reveals no gallop and no friction rub.   No murmur heard. Respiratory: Effort normal and breath sounds normal. No respiratory distress. She has no wheezes. She has no rales. She exhibits no tenderness.  GI: Soft. Bowel sounds are normal. She exhibits no distension and no mass. There is no tenderness. There is no rebound and no guarding.  Genitourinary:  Breasts no masses skin changes or nipple changes bilaterally  Vulva is normal without lesions Vagina is pink moist without discharge Cervix normal in appearance and pap is done Uterus is normal size shape and contour Adnexa is negative with normal sized ovaries  Rectal    hemoccult negative, normal tone, no masses  Musculoskeletal: Normal range of motion. She exhibits no edema and no tenderness.  Neurological: She is alert and oriented to person, place, and time. She has normal reflexes. She displays normal reflexes. No cranial nerve deficit. She exhibits normal muscle tone. Coordination normal.  Skin: Skin is warm and dry. No rash noted. No erythema. No pallor.  Psychiatric: She has a normal mood and affect. Her behavior is normal. Judgment and thought content normal.       Assessment:    Healthy female exam.    Plan:    Follow up in: 1 years.    Continue megestrol

## 2014-08-29 ENCOUNTER — Encounter (HOSPITAL_COMMUNITY)
Admission: RE | Admit: 2014-08-29 | Discharge: 2014-08-29 | Disposition: A | Discharge status: Home / Self Care | Payer: Medicare Other | Source: Ambulatory Visit | Attending: Internal Medicine | Admitting: Internal Medicine

## 2014-08-29 ENCOUNTER — Encounter (HOSPITAL_COMMUNITY): Payer: Self-pay

## 2014-08-29 DIAGNOSIS — B957 Other staphylococcus as the cause of diseases classified elsewhere: Secondary | ICD-10-CM | POA: Insufficient documentation

## 2014-08-29 LAB — COMPREHENSIVE METABOLIC PANEL
ALT: 31 U/L (ref 0–35)
ANION GAP: 6 (ref 5–15)
AST: 26 U/L (ref 0–37)
Albumin: 3.2 g/dL — ABNORMAL LOW (ref 3.5–5.2)
Alkaline Phosphatase: 125 U/L — ABNORMAL HIGH (ref 39–117)
BILIRUBIN TOTAL: 0.2 mg/dL — AB (ref 0.3–1.2)
BUN: 14 mg/dL (ref 6–23)
CHLORIDE: 107 meq/L (ref 96–112)
CO2: 24 mmol/L (ref 19–32)
CREATININE: 0.67 mg/dL (ref 0.50–1.10)
Calcium: 9 mg/dL (ref 8.4–10.5)
GFR calc Af Amer: >90 mL/min (ref >90)
GLUCOSE: 103 mg/dL — AB (ref 70–99)
POTASSIUM: 4.1 mmol/L (ref 3.5–5.1)
Sodium: 137 mmol/L (ref 135–145)
Total Protein: 7.1 g/dL (ref 6.0–8.3)

## 2014-08-29 LAB — CBC
HEMATOCRIT: 29.9 % — AB (ref 36.0–46.0)
Hemoglobin: 9.3 g/dL — ABNORMAL LOW (ref 12.0–15.0)
MCH: 23.3 pg — ABNORMAL LOW (ref 26.0–34.0)
MCHC: 31.1 g/dL (ref 30.0–36.0)
MCV: 74.9 fL — AB (ref 78.0–100.0)
Platelets: 637 10*3/uL — ABNORMAL HIGH (ref 150–400)
RBC: 3.99 MIL/uL (ref 3.87–5.11)
RDW: 16.6 % — ABNORMAL HIGH (ref 11.5–15.5)
WBC: 11.2 10*3/uL — AB (ref 4.0–10.5)

## 2014-08-29 LAB — CK: Total CK: 69 U/L (ref 7–177)

## 2014-08-29 LAB — CYTOLOGY - PAP

## 2014-08-29 MED ORDER — SODIUM CHLORIDE 0.9 % IV SOLN: 860.0000 mg | INTRAVENOUS | Status: DC

## 2014-08-29 MED ORDER — HEPARIN SOD (PORK) LOCK FLUSH 100 UNIT/ML IV SOLN
INTRAVENOUS | Status: AC
Start: 2014-08-29 — End: 2014-08-29
  Filled 2014-08-29: qty 5

## 2014-08-29 MED ORDER — SODIUM CHLORIDE 0.9 % IV SOLN: INTRAVENOUS | Status: DC

## 2014-08-29 MED ORDER — SODIUM CHLORIDE 0.9 % IV SOLN
Freq: Once | INTRAVENOUS | Status: AC
  Administered 2014-08-29: 12:00:00 via INTRAVENOUS

## 2014-08-29 MED ORDER — DAPTOMYCIN 500 MG IV SOLR
8.0000 mg/kg | INTRAVENOUS | Status: DC
  Administered 2014-08-29: 860 mg via INTRAVENOUS
  Filled 2014-08-29: qty 17.2

## 2014-08-29 MED ORDER — HEPARIN SOD (PORK) LOCK FLUSH 100 UNIT/ML IV SOLN
250.0000 [IU] | Freq: Once | INTRAVENOUS | Status: AC
  Administered 2014-08-29: 250 [IU] via INTRAVENOUS

## 2014-08-29 MED ORDER — SODIUM CHLORIDE 0.9 % IJ SOLN
10.0000 mL | INTRAMUSCULAR | Status: DC | PRN
  Administered 2014-08-29: 20 mL
  Filled 2014-08-29: qty 40

## 2014-08-29 NOTE — Progress Notes (Signed)
Peripherally Inserted Central Catheter/Midline Placement  The IV Nurse has discussed with the patient and/or persons authorized to consent for the patient, the purpose of this procedure and the potential benefits and risks involved with this procedure.  The benefits include less needle sticks, lab draws from the catheter and patient may be discharged home with the catheter.  Risks include, but not limited to, infection, bleeding, blood clot (thrombus formation), and puncture of an artery; nerve damage and irregular heat beat.  Alternatives to this procedure were also discussed.  PICC/Midline Placement Documentation  PICC / Midline Single Lumen 54/65/03 PICC Right Basilic 43 cm 4 cm (Active)  Indication for Insertion or Continuance of Line Prolonged intravenous therapies 08/29/2014 10:48 AM  Exposed Catheter (cm) 4 cm 08/29/2014 10:48 AM  Site Assessment Clean;Dry;Intact 08/29/2014 10:48 AM  Line Status Flushed;Saline locked;Blood return noted 08/29/2014 10:48 AM  Dressing Type Transparent;Securing device 08/29/2014 10:48 AM  Dressing Status Clean;Dry;Intact;Antimicrobial disc in place 08/29/2014 10:48 AM  Line Care Connections checked and tightened 08/29/2014 10:48 AM  Dressing Intervention New dressing 08/29/2014 10:48 AM  Dressing Change Due 09/05/14 08/29/2014 10:48 AM       Azari Janssens Renae 08/29/2014, 10:53 AM

## 2014-08-29 NOTE — Progress Notes (Signed)
Results for [name], [name] (MRN [id number]) as of 08/29/2014 13:51  Ref. Range 08/29/2014 11:15  Sodium Latest Range: 135-145 mmol/L 137  Potassium Latest Range: 3.5-5.1 mmol/L 4.1  Chloride Latest Range: 96-112 mEq/L 107  CO2 Latest Range: 19-32 mmol/L 24  BUN Latest Range: 6-23 mg/dL 14  Creatinine Latest Range: 0.50-1.10 mg/dL 0.67  Calcium Latest Range: 8.4-10.5 mg/dL 9.0  GFR calc non Af Amer Latest Range: >90 mL/min >90  GFR calc Af Amer Latest Range: >90 mL/min >90  Glucose Latest Range: 70-99 mg/dL 103 (H)  Anion gap Latest Range: 5-15  6  Alkaline Phosphatase Latest Range: 39-117 U/L 125 (H)  Albumin Latest Range: 3.5-5.2 g/dL 3.2 (L)  AST Latest Range: 0-37 U/L 26  ALT Latest Range: 0-35 U/L 31  Total Protein Latest Range: 6.0-8.3 g/dL 7.1  Total Bilirubin Latest Range: 0.3-1.2 mg/dL 0.2 (L)  CK Total Latest Range: 7-177 U/L 69  WBC Latest Range: 4.0-10.5 K/uL 11.2 (H)  RBC Latest Range: 3.87-5.11 MIL/uL 3.99  Hemoglobin Latest Range: 12.0-15.0 g/dL 9.3 (L)  HCT Latest Range: 36.0-46.0 % 29.9 (L)  MCV Latest Range: 78.0-100.0 fL 74.9 (L)  MCH Latest Range: 26.0-34.0 pg 23.3 (L)  MCHC Latest Range: 30.0-36.0 g/dL 31.1  RDW Latest Range: 11.5-15.5 % 16.6 (H)  Platelets Latest Range: 150-400 K/uL 637 (H)

## 2014-08-29 NOTE — Discharge Instructions (Signed)
Peripherally Inserted Central Catheter/Midline Placement  The IV Nurse has discussed with the patient and/or persons authorized to consent for the patient, the purpose of this procedure and the potential benefits and risks involved with this procedure.  The benefits include less needle sticks, lab draws from the catheter and patient may be discharged home with the catheter.  Risks include, but not limited to, infection, bleeding, blood clot (thrombus formation), and puncture of an artery; nerve damage and irregular heat beat.  Alternatives to this procedure were also discussed.  PICC/Midline Placement Documentation  PICC / Midline Single Lumen 99/24/26 PICC Right Basilic 43 cm 4 cm (Active)  Indication for Insertion or Continuance of Line Prolonged intravenous therapies 08/29/2014 10:48 AM  Exposed Catheter (cm) 4 cm 08/29/2014 10:48 AM  Site Assessment Clean;Dry;Intact 08/29/2014 10:48 AM  Line Status Flushed;Saline locked;Blood return noted 08/29/2014 10:48 AM  Dressing Type Transparent;Securing device 08/29/2014 10:48 AM  Dressing Status Clean;Dry;Intact;Antimicrobial disc in place 08/29/2014 10:48 AM  Line Care Connections checked and tightened 08/29/2014 10:48 AM  Dressing Intervention New dressing 08/29/2014 10:48 AM  Dressing Change Due 09/05/14 08/29/2014 10:48 AM    PICC Insertion, Care After Refer to this sheet in the next few weeks. These instructions provide you with information on caring for yourself after your procedure. Your health care provider may also give you more specific instructions. Your treatment has been planned according to current medical practices, but problems sometimes occur. Call your health care provider if you have any problems or questions after your procedure. WHAT TO EXPECT AFTER THE PROCEDURE After your procedure, it is typical to have the following:  Mild discomfort at the insertion site. This should not last more than a day. HOME CARE INSTRUCTIONS  Rest  at home for the remainder of the day after the procedure.  You may bend your arm and move it freely. If your PICC is near or at the bend of your elbow, avoid activity with repeated motion at the elbow.  Avoid lifting heavy objects as instructed by your health care provider.  Avoid using a crutch with the arm on the same side as your PICC. You may need to use a walker. Bandage Care  Keep your PICC bandage (dressing) clean and dry to prevent infection.  Ask your health care provider when you may shower. To keep the dressing dry, cover the PICC with plastic wrap and tape before showering. If the dressing does become wet, replace it right after the shower.  Do not soak in the bath, swim, or use hot tubs when you have a PICC.  Change the PICC dressing as instructed by your health care provider.  Change your PICC dressing if it becomes loose or wet. General PICC Care  Check the PICC insertion site daily for leakage, redness, swelling, or pain.  Flush the PICC as directed by your health care provider. Let your health care provider know right away if the PICC is difficult to flush or does not flush. Do not use force to flush the PICC.  Do not use a syringe that is less than 10 mL to flush the PICC.  Never pull or tug on the PICC.  Avoid blood pressure checks on the arm with the PICC.  Keep your PICC identification card with you at all times.  Do not take the PICC out yourself. Only a trained health care professional should remove the PICC. SEEK MEDICAL CARE IF:  You have pain in your arm, ear, face, or teeth.  You  have fever or chills.  You have drainage from the PICC insertion site.  You have redness or palpate a "cord" around the PICC insertion site.  You cannot flush the catheter. SEEK IMMEDIATE MEDICAL CARE IF:  You have swelling in the arm in which the PICC is inserted. Document Released: 06/07/2013 Document Revised: 08/22/2013 Document Reviewed: 06/07/2013 [facility]  Patient Information 2015 [place], [place]. This information is not intended to replace advice given to you by your health care provider. Make sure you discuss any questions you have with your health care provider.

## 2014-09-05 ENCOUNTER — Telehealth: Payer: Self-pay | Admitting: Obstetrics & Gynecology

## 2014-09-05 MED ORDER — METRONIDAZOLE 500 MG PO TABS
500.0000 mg | ORAL_TABLET | Freq: Two times a day (BID) | ORAL | Status: DC
Start: 2014-09-05 — End: 2015-04-08

## 2014-09-06 NOTE — Telephone Encounter (Signed)
Pt informed pap positive for trich on 08/28/2014. RX for Metronidazole sent to [place] for her and partner. Pt to f/u with Dr. Elonda Husky in 3 weeks and informed not to have sex until that appt for POC. Pt verbalized understanding.

## 2014-09-12 ENCOUNTER — Emergency Department (HOSPITAL_COMMUNITY): Payer: Medicare Other

## 2014-09-12 ENCOUNTER — Encounter (HOSPITAL_COMMUNITY): Payer: Self-pay

## 2014-09-12 ENCOUNTER — Emergency Department (HOSPITAL_COMMUNITY)
Admission: EM | Admit: 2014-09-12 | Discharge: 2014-09-12 | Disposition: A | Discharge status: Home / Self Care | Payer: Medicare Other | Attending: Emergency Medicine | Admitting: Emergency Medicine

## 2014-09-12 DIAGNOSIS — R531 Weakness: Secondary | ICD-10-CM | POA: Insufficient documentation

## 2014-09-12 DIAGNOSIS — Z0189 Encounter for other specified special examinations: Secondary | ICD-10-CM

## 2014-09-12 DIAGNOSIS — Z0183 Encounter for blood typing: Secondary | ICD-10-CM | POA: Diagnosis not present

## 2014-09-12 DIAGNOSIS — Z7982 Long term (current) use of aspirin: Secondary | ICD-10-CM | POA: Diagnosis not present

## 2014-09-12 DIAGNOSIS — M1990 Unspecified osteoarthritis, unspecified site: Secondary | ICD-10-CM | POA: Insufficient documentation

## 2014-09-12 DIAGNOSIS — Z791 Long term (current) use of non-steroidal anti-inflammatories (NSAID): Secondary | ICD-10-CM | POA: Diagnosis not present

## 2014-09-12 DIAGNOSIS — Z872 Personal history of diseases of the skin and subcutaneous tissue: Secondary | ICD-10-CM | POA: Diagnosis not present

## 2014-09-12 DIAGNOSIS — Z792 Long term (current) use of antibiotics: Secondary | ICD-10-CM | POA: Insufficient documentation

## 2014-09-12 DIAGNOSIS — Z8619 Personal history of other infectious and parasitic diseases: Secondary | ICD-10-CM | POA: Diagnosis not present

## 2014-09-12 DIAGNOSIS — Z87891 Personal history of nicotine dependence: Secondary | ICD-10-CM | POA: Insufficient documentation

## 2014-09-12 DIAGNOSIS — R7989 Other specified abnormal findings of blood chemistry: Secondary | ICD-10-CM | POA: Diagnosis present

## 2014-09-12 HISTORY — DX: Unspecified infectious disease: B99.9

## 2014-09-12 LAB — CBC WITH DIFFERENTIAL/PLATELET
Basophils Absolute: 0.1 10*3/uL (ref 0.0–0.1)
Basophils Relative: 1 % (ref 0–1)
EOS ABS: 0.1 10*3/uL (ref 0.0–0.7)
EOS PCT: 1 % (ref 0–5)
HCT: 30.4 % — ABNORMAL LOW (ref 36.0–46.0)
Hemoglobin: 9.5 g/dL — ABNORMAL LOW (ref 12.0–15.0)
Lymphocytes Relative: 24 % (ref 12–46)
Lymphs Abs: 2.3 10*3/uL (ref 0.7–4.0)
MCH: 23.2 pg — ABNORMAL LOW (ref 26.0–34.0)
MCHC: 31.3 g/dL (ref 30.0–36.0)
MCV: 74.3 fL — ABNORMAL LOW (ref 78.0–100.0)
Monocytes Absolute: 0.9 10*3/uL (ref 0.1–1.0)
Monocytes Relative: 9 % (ref 3–12)
Neutro Abs: 6.3 10*3/uL (ref 1.7–7.7)
Neutrophils Relative %: 65 % (ref 43–77)
Platelets: 537 10*3/uL — ABNORMAL HIGH (ref 150–400)
RBC: 4.09 MIL/uL (ref 3.87–5.11)
RDW: 16.8 % — AB (ref 11.5–15.5)
WBC: 9.7 10*3/uL (ref 4.0–10.5)

## 2014-09-12 LAB — URINALYSIS, ROUTINE W REFLEX MICROSCOPIC
Bilirubin Urine: NEGATIVE
Glucose, UA: NEGATIVE mg/dL
Leukocytes, UA: NEGATIVE
NITRITE: NEGATIVE
PH: 5.5 (ref 5.0–8.0)
Protein, ur: NEGATIVE mg/dL
Specific Gravity, Urine: 1.02 (ref 1.005–1.030)
Urobilinogen, UA: 1 mg/dL (ref 0.0–1.0)

## 2014-09-12 LAB — URINE MICROSCOPIC-ADD ON

## 2014-09-12 LAB — I-STAT CHEM 8, ED
BUN: 6 mg/dL (ref 6–23)
Calcium, Ion: 1.11 mmol/L — ABNORMAL LOW (ref 1.12–1.23)
Chloride: 106 mEq/L (ref 96–112)
Creatinine, Ser: 0.5 mg/dL (ref 0.50–1.10)
GLUCOSE: 114 mg/dL — AB (ref 70–99)
HCT: 50 % — ABNORMAL HIGH (ref 36.0–46.0)
Hemoglobin: 17 g/dL — ABNORMAL HIGH (ref 12.0–15.0)
POTASSIUM: 3.9 mmol/L (ref 3.5–5.1)
Sodium: 138 mmol/L (ref 135–145)
TCO2: 19 mmol/L (ref 0–100)

## 2014-09-12 NOTE — ED Notes (Signed)
Pt reports Home Health RN drew lab work yesterday and called today with hemoglobin 3.4.  Pt reports Dr. Elicia Lamp instructed her to come to ed for evaluation.  Pt denies any black or bloody stools.  Denies SOB.  Reports has been sleeping more than usual since Sunday, fatigued, and colder than normal.  Reports nausea but no vomiting.  C/O headache.  Pt has PICC line in r arm and is receiving iv antibiotics at home for infection in r knee.  Pt says had 2 knee replacements on r knee.  Last one was last year.  Reports has been fighting this infection for the past year.

## 2014-09-12 NOTE — Discharge Instructions (Signed)

## 2014-09-12 NOTE — ED Provider Notes (Signed)
CSN: [id number]     Arrival date & time 09/12/14  1011 History   First MD Initiated Contact with Patient 09/12/14 1019     Chief Complaint  Patient presents with  . low hemoglobin      (Consider location/radiation/quality/duration/timing/severity/associated sxs/prior Treatment) HPI Comments: Pt comes in to have her hgb checked. Pt states that her hgb is 3.4. Pt states that she has home health because of a knee infection. Pt states that she is not having cp or sob. Denies blood from urine or stool. States that she doesn't get her period anymore because she is on constant birth control. States that she has felt a little tired that last couple of days  The history is provided by the patient. No language interpreter was used.    Past Medical History  Diagnosis Date  . Eczema   . Muscle spasm of back   . Shortness of breath     with exertion   . Arthritis   . Infection     r knee   Past Surgical History  Procedure Laterality Date  . Lipoma excision  11/16/2011    Procedure: EXCISION LIPOMA;  Surgeon: Scherry Ran, MD;  Location: [facility];  Service: General;  Laterality: Left;  Excision of neoplasm left shoulder  . Tubal ligation      and burned per patient.   . Multiple extractions with alveoloplasty  12/28/2011    Procedure: MULTIPLE EXTRACION WITH ALVEOLOPLASTY;  Surgeon: Gae Bon, DDS;  Location: [place];  Service: Oral Surgery;  Laterality: Bilateral;  . Mouth surgery    . Shoulder surgery Left   . 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 Joint replacement      r knee  . Patellar tendon repair Right 05/12/2013    Procedure: PATELLA TENDON REPAIR AND ALLOGRAFT RECONSTRUCTION;  Surgeon: Carole Civil, MD;  Location: [facility];  Service: Orthopedics;  Laterality: Right;  . Total knee arthroplasty Right 05/12/2013    Procedure: POLY  EXCHANGE;  Surgeon: Carole Civil, MD;  Location: [facility];  Service: Orthopedics;  Laterality: Right;  . Irrigation and debridement knee Right 05/12/2013    Procedure: IRRIGATION AND DEBRIDEMENT KNEE;  Surgeon: Carole Civil, MD;  Location: [facility];  Service: Orthopedics;  Laterality: Right;   Family History  Problem Relation Age of Onset  . Arthritis    . Anesthesia problems Neg Hx   . Hypotension Neg Hx   . Malignant hyperthermia Neg Hx   . Pseudochol deficiency Neg Hx   . Diabetes Brother    History  Substance Use Topics  . Smoking status: Former Smoker -- 0.10 packs/day for 1 years    Types: Cigarettes  . Smokeless tobacco: Never Used  . Alcohol Use: No   OB History    Gravida Para Term Preterm AB TAB SAB Ectopic Multiple Living   5 5 5      1 6      Review of Systems  All other systems reviewed and are negative.     Allergies  Chocolate  Home Medications   Prior to Admission medications   Medication Sig Start Date End Date Taking? Authorizing Provider  aspirin EC 325 MG EC tablet Take 1 tablet (325 mg total) by mouth 2 (two) times daily. Patient not taking: Reported on 08/28/2014 04/20/13   Tim Lair  Aline Brochure, MD  cephALEXin (KEFLEX) 500 MG capsule Take 500 mg by mouth 4 (four) times daily.    Historical Provider, MD  clobetasol cream (TEMOVATE) 1.19 % Apply 1 application topically 2 (two) times daily as needed (rash).    Historical Provider, MD  diclofenac (CATAFLAM) 50 MG tablet Take 1 tablet (50 mg total) by mouth 2 (two) times daily. Patient not taking: Reported on 08/28/2014 01/23/14 01/23/15  Carole Civil, MD  diphenhydrAMINE (BENADRYL) 25 mg capsule Take 25 mg by mouth every 6 (six) hours as needed for itching or allergies.    Historical Provider, MD  ketoconazole (NIZORAL) 2 % cream Apply 1 application topically daily.    Historical Provider, MD  megestrol (MEGACE) 40 MG tablet Take 1 tablet (40 mg total) by mouth daily. 08/28/14   Florian Buff, MD   metroNIDAZOLE (FLAGYL) 500 MG tablet Take 1 tablet (500 mg total) by mouth 2 (two) times daily. 09/05/14   Florian Buff, MD  oxyCODONE-acetaminophen (PERCOCET) 7.5-325 MG per tablet Take 1 tablet by mouth every 4 (four) hours as needed for pain. Patient not taking: Reported on 08/28/2014 03/19/14   Carole Civil, MD  oxyCODONE-acetaminophen (PERCOCET/ROXICET) 5-325 MG per tablet Take 1 tablet by mouth every 4 (four) hours as needed. 08/21/14   Carole Civil, MD  travoprost, benzalkonium, (TRAVATAN) 0.004 % ophthalmic solution 1 drop at bedtime.    Historical Provider, MD  zolpidem (AMBIEN) 10 MG tablet Take 10 mg by mouth at bedtime as needed for sleep.    Historical Provider, MD   BP 147/85 mmHg  Pulse 104  Temp(Src) 98.8 F (37.1 C) (Oral)  Resp 18  Ht 5\' 2"  (1.575 m)  Wt 236 lb (107.049 kg)  BMI 43.15 kg/m2  SpO2 98%  LMP 01/15/2012 Physical Exam  Constitutional: She is oriented to person, place, and time. She appears well-developed and well-nourished.  HENT:  Right Ear: External ear normal.  Left Ear: External ear normal.  Mouth/Throat: Oropharynx is clear and moist.  Cardiovascular: Normal rate and regular rhythm.   Pulmonary/Chest: Effort normal and breath sounds normal.  Neurological: She is alert and oriented to person, place, and time.  Skin: Skin is warm and dry. No pallor.  Nursing note and vitals reviewed.   ED Course  Procedures (including critical care time) Labs Review Labs Reviewed  URINALYSIS, ROUTINE W REFLEX MICROSCOPIC - Abnormal; Notable for the following:    Hgb urine dipstick MODERATE (*)    Ketones, ur TRACE (*)    All other components within normal limits  CBC WITH DIFFERENTIAL - Abnormal; Notable for the following:    Hemoglobin 9.5 (*)    HCT 30.4 (*)    MCV 74.3 (*)    MCH 23.2 (*)    RDW 16.8 (*)    Platelets 537 (*)    All other components within normal limits  URINE MICROSCOPIC-ADD ON - Abnormal; Notable for the following:     Squamous Epithelial / LPF FEW (*)    Bacteria, UA FEW (*)    All other components within normal limits  I-STAT CHEM 8, ED    Imaging Review Dg Chest 2 View  09/12/2014   CLINICAL DATA:  Weakness, dizziness, and nausea for 3 days. Current smoker.  EXAM: CHEST  2 VIEW  COMPARISON:  01/23/2014  FINDINGS: Left PICC has been removed. New right PICC line has been placed with tip near the cavoatrial junction. The cardiomediastinal silhouette is within normal limits. The patient has  taken a greater inspiration than on the prior study. Linear opacities are again seen in both mid lungs, similar to the prior study and may reflect atelectasis or scarring. There is no evidence of acute airspace consolidation, pulmonary edema, pleural effusion, or pneumothorax. No acute osseous abnormality is identified.  IMPRESSION: No active cardiopulmonary disease.   Electronically Signed   By: Logan Bores   On: 09/12/2014 11:36     EKG Interpretation None      MDM   Final diagnoses:  Weakness  Visit for laboratory test   hgb at baseline. No need for admission. Pt is okay to go home    Glendell Docker, NP 09/12/14 [place], MD 09/12/14 [phone]

## 2014-09-18 ENCOUNTER — Other Ambulatory Visit: Payer: Self-pay | Admitting: *Deleted

## 2014-09-18 ENCOUNTER — Telehealth: Payer: Self-pay | Admitting: Orthopedic Surgery

## 2014-09-18 DIAGNOSIS — Z9889 Other specified postprocedural states: Secondary | ICD-10-CM

## 2014-09-18 MED ORDER — OXYCODONE-ACETAMINOPHEN 5-325 MG PO TABS: 1.0000 | ORAL_TABLET | ORAL | Status: DC | PRN

## 2014-09-18 NOTE — Telephone Encounter (Signed)
Patient is callling requesting a refill on her pain medication oxyCODONE-acetaminophen (PERCOCET/ROXICET) 5-325 MG per tablet  Please advise?

## 2014-09-20 NOTE — Telephone Encounter (Signed)
Patient picked up Rx

## 2014-09-20 NOTE — Telephone Encounter (Signed)
Prescription is available for pick up, she is aware

## 2014-10-04 ENCOUNTER — Ambulatory Visit (INDEPENDENT_AMBULATORY_CARE_PROVIDER_SITE_OTHER): Payer: Medicare Other | Admitting: Obstetrics & Gynecology

## 2014-10-04 ENCOUNTER — Encounter: Payer: Self-pay | Admitting: Obstetrics & Gynecology

## 2014-10-04 VITALS — BP 152/80 | Wt 233.0 lb

## 2014-10-04 DIAGNOSIS — A5901 Trichomonal vulvovaginitis: Secondary | ICD-10-CM

## 2014-10-04 NOTE — Progress Notes (Signed)
Patient ID: [name], female   DOB: 1960-09-02, 54 y.o.   MRN: [id number] Pt had + trichomonas on her pap smear findings  She and her partner have been treated and have not had intercourse since I saw her  Wet Prep Negative for BV, Trichomonas and yeast  Impression  Resolved trichomonas vaginitis  Plan resume sexual relations and follow up prn

## 2014-10-19 ENCOUNTER — Telehealth: Payer: Self-pay | Admitting: Orthopedic Surgery

## 2014-10-19 NOTE — Telephone Encounter (Signed)
Patient called to request refill on pain medication, states Oxycodone/Percocet 5/325.  Her ph# is [phone]

## 2014-10-22 ENCOUNTER — Other Ambulatory Visit: Payer: Self-pay | Admitting: *Deleted

## 2014-10-22 DIAGNOSIS — Z9889 Other specified postprocedural states: Secondary | ICD-10-CM

## 2014-10-22 MED ORDER — OXYCODONE-ACETAMINOPHEN 5-325 MG PO TABS: 1.0000 | ORAL_TABLET | ORAL | Status: DC | PRN

## 2014-10-22 NOTE — Telephone Encounter (Signed)
Patient picked up Rx

## 2014-10-22 NOTE — Telephone Encounter (Signed)
Prescription available, patient aware  

## 2014-10-29 ENCOUNTER — Other Ambulatory Visit (HOSPITAL_COMMUNITY): Payer: Self-pay | Admitting: Family Medicine

## 2014-10-29 DIAGNOSIS — Z1231 Encounter for screening mammogram for malignant neoplasm of breast: Secondary | ICD-10-CM

## 2014-11-14 ENCOUNTER — Ambulatory Visit (HOSPITAL_COMMUNITY): Payer: Medicare Other

## 2014-11-21 ENCOUNTER — Ambulatory Visit (HOSPITAL_COMMUNITY)
Admission: RE | Admit: 2014-11-21 | Discharge: 2014-11-21 | Disposition: A | Discharge status: Home / Self Care | Payer: Medicare Other | Source: Ambulatory Visit | Attending: Family Medicine | Admitting: Family Medicine

## 2014-11-21 DIAGNOSIS — Z1231 Encounter for screening mammogram for malignant neoplasm of breast: Secondary | ICD-10-CM | POA: Insufficient documentation

## 2014-11-26 ENCOUNTER — Telehealth: Payer: Self-pay | Admitting: Orthopedic Surgery

## 2014-11-26 NOTE — Telephone Encounter (Signed)
Patient called, left voice message, requesting refill of medication: oxyCODONE-acetaminophen (PERCOCET/ROXICET) 5-325 MG per tablet [[id number]]  Her phone# is [phone]

## 2014-11-29 ENCOUNTER — Other Ambulatory Visit: Payer: Self-pay | Admitting: *Deleted

## 2014-11-29 DIAGNOSIS — Z9889 Other specified postprocedural states: Secondary | ICD-10-CM

## 2014-11-29 MED ORDER — OXYCODONE-ACETAMINOPHEN 5-325 MG PO TABS: 1.0000 | ORAL_TABLET | ORAL | Status: DC | PRN

## 2014-12-03 NOTE — Telephone Encounter (Signed)
Prescription available, called patient, no answer 

## 2014-12-03 NOTE — Telephone Encounter (Signed)
Patient Picked up Rx 

## 2014-12-18 ENCOUNTER — Telehealth: Payer: Self-pay | Admitting: Obstetrics & Gynecology

## 2014-12-18 NOTE — Telephone Encounter (Signed)
Pt c/o vaginal itching, no c/o discharge. Call transferred to front staff for soonest available appt with Dr. Elonda Husky.

## 2014-12-25 ENCOUNTER — Ambulatory Visit (INDEPENDENT_AMBULATORY_CARE_PROVIDER_SITE_OTHER): Payer: Medicare Other | Admitting: Obstetrics & Gynecology

## 2014-12-25 ENCOUNTER — Encounter: Payer: Self-pay | Admitting: Obstetrics & Gynecology

## 2014-12-25 VITALS — BP 140/80 | HR 80 | Wt 239.0 lb

## 2014-12-25 DIAGNOSIS — N898 Other specified noninflammatory disorders of vagina: Secondary | ICD-10-CM

## 2014-12-25 NOTE — Progress Notes (Signed)
Patient ID: [name], female   DOB: 04/16/1961, 54 y.o.   MRN: [id number]   Chief Complaint  Patient presents with  . gyn visit    c/c vaginal itching/ no discharge.    Blood pressure 140/80, pulse 80, weight 239 lb (108.41 kg), last menstrual period 01/15/2012.  Pt presented with complaint of discharge  It is noted she was treated in 10/2014 for trichomonas with a TOC wet prep which was negative for trich or other pathology  Exam No evidence of discharge Used clobetasol which pt mistook for discharge  No treatment needed  Follow up prn     Face to face time:  10 minutes  Greater than 50% of the visit time was spent in counseling and coordination of care with the patient.  The summary and outline of the counseling and care coordination is summarized in the note above.   All questions were answered.

## 2014-12-25 NOTE — Progress Notes (Deleted)
Patient ID: [name], female   DOB: 12-20-1960, 54 y.o.   MRN: [id number] {***Remember:  The highest supported level for 2 out of 3 for history, physical and MDM determines the overall CPT code.  However the MDM drives the history and physical components.  In other words, the MDM level of complexity should be consistent with the amount of history/physical charting needed to evaluate for and justify the MDM}  Chief Complaint  Patient presents with  . gyn visit    c/c vaginal itching/ no discharge.     HPI:  {***1-3 elements(99212,99213)      [id number])             OR         Documentation of the status of 3 chronic medical problems}  54 y.o. [id number] Patient's last menstrual period was 01/15/2012.  *** Location:  ***. Quality:  ***. Severity:  ***. Timing:  ***. Duration:  ***. Context:  ***. Modifying factors:  *** Signs/Symptoms:  ***    Current outpatient prescriptions:  .  clobetasol cream (TEMOVATE) 9.44 %, Apply 1 application topically 2 (two) times daily as needed (rash)., Disp: , Rfl:  .  diclofenac (CATAFLAM) 50 MG tablet, Take 1 tablet (50 mg total) by mouth 2 (two) times daily., Disp: 60 tablet, Rfl: 5 .  ketoconazole (NIZORAL) 2 % cream, Apply 1 application topically daily., Disp: , Rfl:  .  megestrol (MEGACE) 40 MG tablet, Take 1 tablet (40 mg total) by mouth daily., Disp: 30 tablet, Rfl: 11 .  oxyCODONE-acetaminophen (PERCOCET/ROXICET) 5-325 MG per tablet, Take 1 tablet by mouth every 4 (four) hours as needed., Disp: 180 tablet, Rfl: 0 .  travoprost, benzalkonium, (TRAVATAN) 0.004 % ophthalmic solution, Place 1 drop into both eyes at bedtime. , Disp: , Rfl:  .  zolpidem (AMBIEN) 10 MG tablet, Take 10 mg by mouth at bedtime as needed for sleep., Disp: , Rfl:  .  aspirin EC 325 MG EC tablet, Take 1 tablet (325 mg total) by mouth 2 (two) times daily. (Patient not taking: Reported on 12/25/2014), Disp: 60 tablet, Rfl: 0 .  cephALEXin (KEFLEX) 500 MG capsule, Take 500 mg  by mouth 4 (four) times daily., Disp: , Rfl:  .  diphenhydrAMINE (BENADRYL) 25 mg capsule, Take 25 mg by mouth every 6 (six) hours as needed for itching or allergies., Disp: , Rfl:  .  metroNIDAZOLE (FLAGYL) 500 MG tablet, Take 1 tablet (500 mg total) by mouth 2 (two) times daily. (Patient not taking: Reported on 12/25/2014), Disp: 14 tablet, Rfl: 1 .  oxyCODONE-acetaminophen (PERCOCET) 7.5-325 MG per tablet, Take 1 tablet by mouth every 4 (four) hours as needed for pain. (Patient not taking: Reported on 12/25/2014), Disp: 180 tablet, Rfl: 0  Problem Pertinent ROS:     {***9(67591)      6(38466)        (Inquires about the system related to the problem identified in the HPI)}  ***  Extended ROS:   {***2-9 systems related to HPI(99214)}  ***   PMFSH:    [phone])     1(99214)}        *** Past Medical History  Diagnosis Date  . Eczema   . Muscle spasm of back   . Shortness of breath     with exertion   . Arthritis   . Infection     r knee    Past Surgical History  Procedure Laterality Date  . Lipoma excision  11/16/2011  Procedure: EXCISION LIPOMA;  Surgeon: Scherry Ran, MD;  Location: [facility];  Service: General;  Laterality: Left;  Excision of neoplasm left shoulder  . Tubal ligation      and burned per patient.   . Multiple extractions with alveoloplasty  12/28/2011    Procedure: MULTIPLE EXTRACION WITH ALVEOLOPLASTY;  Surgeon: Gae Bon, DDS;  Location: [place];  Service: Oral Surgery;  Laterality: Bilateral;  . Mouth surgery    . Shoulder surgery Left   . 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 Joint replacement      r knee  . Patellar tendon repair Right 05/12/2013    Procedure: PATELLA TENDON REPAIR AND ALLOGRAFT RECONSTRUCTION;  Surgeon: Carole Civil, MD;  Location: [facility];  Service: Orthopedics;   Laterality: Right;  . Total knee arthroplasty Right 05/12/2013    Procedure: POLY EXCHANGE;  Surgeon: Carole Civil, MD;  Location: [facility];  Service: Orthopedics;  Laterality: Right;  . Irrigation and debridement knee Right 05/12/2013    Procedure: IRRIGATION AND DEBRIDEMENT KNEE;  Surgeon: Carole Civil, MD;  Location: [facility];  Service: Orthopedics;  Laterality: Right;    OB History    Gravida Para Term Preterm AB TAB SAB Ectopic Multiple Living   5 5 5      1 6       Allergies  Allergen Reactions  . Chocolate Hives    History   Social History  . Marital Status: Single    Spouse Name: N/A  . Number of Children: N/A  . Years of Education: 12   Occupational History  .     Social History Main Topics  . Smoking status: Former Smoker -- 0.10 packs/day for 1 years    Types: Cigarettes  . Smokeless tobacco: Never Used  . Alcohol Use: No  . Drug Use: No  . Sexual Activity: Yes   Other Topics Concern  . None   Social History Narrative    Family History  Problem Relation Age of Onset  . Arthritis    . Anesthesia problems Neg Hx   . Hypotension Neg Hx   . Malignant hyperthermia Neg Hx   . Pseudochol deficiency Neg Hx   . Diabetes Brother      Examination:  Vitals:  Blood pressure 140/80, pulse 80, weight 239 lb (108.41 kg), last menstrual period 01/15/2012.  {***(3 vital signs=1 bullet)}  Physical Examination:   {***6(62947)   2-4: 6 bullets(99213)    5-7: 12 bullets(99214)}  {PHYSICAL EXAM WITH PROVIDER CHOICES:22563}  Vulva:  {external genitalia female:315901::"normal appearing vulva with no masses, tenderness or lesions"} Vagina:  {Exam; MLYYTK:35465} Cervix:  {exam; cervix:14595} Uterus:  {exam; uterus:12215} Adnexa: ovaries:{DESC; PRESENT/NOT PRESENT:21021351},  {adnexa:315906::"normal adnexa in size, nontender and no masses"}     DATA orders and reviews: Labs {Actions; were/were not:15343} ordered today:  *** Imaging studies {Actions;  were/were not:15343} ordered today:  ***  Lab tests {Actions; were/were not:15343} reviewed today:   *** Imaging studies {Actions; were/were not:15343} reviewed today:  ***  I {DESC; DID/NOT:23139} independently review/view images, tracing or specimen(not simply the report) myself.  Prescription Drug Management:  New Prescriptions: *** Renewed Prescriptions:  *** Current prescription changes:  ***   Impression/Plan(Problem Based): 1.  ***      ({New prob or follow up:31724}) : Additional workup {ACTION; IS/IS KCL:27517001} needed:  ***  {  2.  ***      ({New prob or follow up:31724}:{Improving/worsening/no change:60406}) : Additional workup {ACTION; IS/IS HWY:61683729} needed:  ***}  {3.   ***      ({New prob or follow up:31724}: {Improving/worsening/no change:60406}) : Additional workup {ACTION; IS/IS MSX:11552080} needed:  ***}   Follow Up:   *** {days/wks/mos/yrs:310907}  {99212: 10 min     99213: 15 min      99214:25 min}   Face to face time:  *** minutes  Greater than 50% of the visit time was spent in counseling and coordination of care with the patient.  The summary and outline of the counseling and care coordination is summarized in the note above.   All questions were answered.

## 2014-12-31 ENCOUNTER — Other Ambulatory Visit: Payer: Self-pay | Admitting: *Deleted

## 2014-12-31 ENCOUNTER — Telehealth: Payer: Self-pay | Admitting: Orthopedic Surgery

## 2014-12-31 DIAGNOSIS — Z9889 Other specified postprocedural states: Secondary | ICD-10-CM

## 2014-12-31 MED ORDER — OXYCODONE-ACETAMINOPHEN 5-325 MG PO TABS: 1.0000 | ORAL_TABLET | ORAL | Status: DC | PRN

## 2014-12-31 NOTE — Telephone Encounter (Signed)
Prescription available, patient aware  

## 2014-12-31 NOTE — Telephone Encounter (Signed)
Patient picked up Rx

## 2014-12-31 NOTE — Telephone Encounter (Signed)
Patient called requesting a refill onoxyCODONE-acetaminophen (PERCOCET) 7.5-325 MG per tablet please advise?

## 2015-01-29 ENCOUNTER — Other Ambulatory Visit: Payer: Self-pay | Admitting: *Deleted

## 2015-01-29 ENCOUNTER — Telehealth: Payer: Self-pay | Admitting: Orthopedic Surgery

## 2015-01-29 DIAGNOSIS — Z9889 Other specified postprocedural states: Secondary | ICD-10-CM

## 2015-01-29 MED ORDER — OXYCODONE-ACETAMINOPHEN 5-325 MG PO TABS: 1.0000 | ORAL_TABLET | ORAL | Status: DC | PRN

## 2015-01-29 NOTE — Telephone Encounter (Signed)
Patient called for refill of medication oxyCODONE-acetaminophen (PERCOCET - please advise; phone # [phone]

## 2015-01-30 NOTE — Telephone Encounter (Signed)
Prescription available, patient aware  

## 2015-02-04 NOTE — Telephone Encounter (Signed)
Patient Picked up Rx 

## 2015-03-06 ENCOUNTER — Other Ambulatory Visit: Payer: Self-pay | Admitting: *Deleted

## 2015-03-06 ENCOUNTER — Telehealth: Payer: Self-pay | Admitting: Orthopedic Surgery

## 2015-03-06 DIAGNOSIS — Z9889 Other specified postprocedural states: Secondary | ICD-10-CM

## 2015-03-06 MED ORDER — OXYCODONE-ACETAMINOPHEN 5-325 MG PO TABS: 1.0000 | ORAL_TABLET | ORAL | Status: DC | PRN

## 2015-03-06 NOTE — Telephone Encounter (Signed)
Patient requests refill of pain medication: oxyCODONE-acetaminophen (PERCOCET/ROXICET) 5-325 MG per tablet [[id number]]   - she is also due for total knee follow/up/Xray appointment; referral from primary care to follow, per insurance requirement; patient aware.  Patient's phone [phone]

## 2015-03-06 NOTE — Telephone Encounter (Signed)
Prescription available, patient aware  

## 2015-03-07 NOTE — Telephone Encounter (Signed)
Patient picked up Rx

## 2015-04-08 ENCOUNTER — Encounter: Payer: Self-pay | Admitting: Orthopedic Surgery

## 2015-04-08 ENCOUNTER — Ambulatory Visit (INDEPENDENT_AMBULATORY_CARE_PROVIDER_SITE_OTHER): Payer: Medicare Other

## 2015-04-08 ENCOUNTER — Ambulatory Visit (INDEPENDENT_AMBULATORY_CARE_PROVIDER_SITE_OTHER): Payer: Medicare Other | Admitting: Orthopedic Surgery

## 2015-04-08 VITALS — BP 127/68 | Ht 62.0 in | Wt 232.0 lb

## 2015-04-08 DIAGNOSIS — Z96651 Presence of right artificial knee joint: Secondary | ICD-10-CM | POA: Diagnosis not present

## 2015-04-08 DIAGNOSIS — Z9889 Other specified postprocedural states: Secondary | ICD-10-CM | POA: Diagnosis not present

## 2015-04-08 MED ORDER — OXYCODONE-ACETAMINOPHEN 5-325 MG PO TABS: 1.0000 | ORAL_TABLET | ORAL | Status: DC | PRN

## 2015-04-08 NOTE — Progress Notes (Signed)
Patient ID: [name], female   DOB: 03/12/1961, 54 y.o.   MRN: [id number]  Follow up visit  Chief Complaint  Patient presents with  . Follow-up    YEARLY FOLLOW UP + XRAY RIGHT TKA, dos 04/17/13    BP 127/68 mmHg  Ht 5\' 2"  (1.575 m)  Wt 232 lb (105.235 kg)  BMI 42.42 kg/m2  LMP 01/15/2012  Encounter Diagnoses  Name Primary?  . History of right knee joint replacement Yes  . S/P knee surgery     [name] is now followed at [facility]. We took x-rays today she does have some periprosthetic lucencies but overall no gross loosening of the implant  She does complain of pain and takes Percocet for that. She is scheduled for two-stage revision once her home setting can be straightened out  Overall arc of flexion at the knee is approximately 75. Her incision is healed her knee feels warm to touch no instability noted in the knee neurovascular exam is intact no purulent material was noted. Vital signs are stable appearance is normal she is in good spirits  X-rays taken and reviewed and reported as noted.  Follow-up 6 months

## 2015-04-09 ENCOUNTER — Ambulatory Visit (INDEPENDENT_AMBULATORY_CARE_PROVIDER_SITE_OTHER): Payer: Medicare Other | Admitting: Obstetrics & Gynecology

## 2015-04-09 VITALS — BP 120/70 | HR 88 | Wt 235.4 lb

## 2015-04-09 DIAGNOSIS — R8290 Unspecified abnormal findings in urine: Secondary | ICD-10-CM | POA: Diagnosis not present

## 2015-04-09 LAB — POCT URINALYSIS DIPSTICK
Blood, UA: NEGATIVE
Glucose, UA: NEGATIVE
Ketones, UA: NEGATIVE
LEUKOCYTES UA: NEGATIVE
Nitrite, UA: NEGATIVE

## 2015-04-09 NOTE — Progress Notes (Signed)
Patient ID: [name], female   DOB: 07-24-1961, 54 y.o.   MRN: [id number] Chief Complaint  Patient presents with  . Follow-up    urine has odor.    Blood pressure 120/70, pulse 88, weight 235 lb 6.4 oz (106.777 kg), last menstrual period 01/15/2012.  Subjective Pt with odor ?urine vs vaginal Had trichomonas before wants to make sure that is negative  Objective Vulva:  normal appearing vulva with no masses, tenderness or lesions Vagina:  normal mucosa, no discharge Cervix:  no cervical motion tenderness and no lesions Uterus:  not examined Adnexa: ovaries:present,    Wet Prep:   A sample of vaginal discharge was obtained from the posterior fornix using a cotton swab. 2 drops of saline were placed on a slide and the cotton swab was immersed in the saline. Microscopic evaluation was performed and results were as follows:  Negative  for yeast  Negative for clue cells , consistent with Bacterial vaginosis Negative for trichomonas  Normal WBC population   Whiff test: Negative   Pertinent ROS No burning with urination, frequency or urgency No nausea, vomiting or diarrhea Nor fever chills or other constitutional symptoms   Labs or studies   Impression New Diagnosis: Dehydration  Wet prep negative and UA negative will culture urine  Established relevant diagnosis(es):   Plan/Recommendations Drink until urine stays clear  Follow up prn    All questions were answered.

## 2015-04-09 NOTE — Addendum Note (Signed)
Addended by: Diona Fanti A on: 04/09/2015 02:49 PM   Modules accepted: Orders

## 2015-04-11 LAB — URINE CULTURE

## 2015-05-08 ENCOUNTER — Other Ambulatory Visit: Payer: Self-pay | Admitting: *Deleted

## 2015-05-08 ENCOUNTER — Telehealth: Payer: Self-pay | Admitting: Orthopedic Surgery

## 2015-05-08 DIAGNOSIS — Z9889 Other specified postprocedural states: Secondary | ICD-10-CM

## 2015-05-08 MED ORDER — OXYCODONE-ACETAMINOPHEN 5-325 MG PO TABS: 1.0000 | ORAL_TABLET | ORAL | Status: DC | PRN

## 2015-05-08 NOTE — Telephone Encounter (Signed)
Patient called for refill of medication: oxyCODONE-acetaminophen (PERCOCET/ROXICET) 5-325 MG per tablet [[id number] - her phone# is [phone]

## 2015-05-08 NOTE — Telephone Encounter (Signed)
Prescription available, patient aware  

## 2015-05-09 NOTE — Telephone Encounter (Signed)
Patient picked up prescription.

## 2015-06-06 ENCOUNTER — Telehealth: Payer: Self-pay | Admitting: Orthopedic Surgery

## 2015-06-06 ENCOUNTER — Other Ambulatory Visit: Payer: Self-pay | Admitting: *Deleted

## 2015-06-06 DIAGNOSIS — Z9889 Other specified postprocedural states: Secondary | ICD-10-CM

## 2015-06-06 MED ORDER — OXYCODONE-ACETAMINOPHEN 5-325 MG PO TABS: 1.0000 | ORAL_TABLET | ORAL | Status: DC | PRN

## 2015-06-06 NOTE — Telephone Encounter (Signed)
Prescription is available for Pick Up, Patient is aware

## 2015-06-06 NOTE — Telephone Encounter (Signed)
Patient called to request refill on medication: oxyCODONE-acetaminophen (PERCOCET/ROXICET) 5-325 MG per tablet [[id number]] - states it is due for refill over the weekend, and quickly hung up when I relayed that it will be reviewed for refill.  Her ph# is [phone]

## 2015-06-18 NOTE — Telephone Encounter (Signed)
Patient picked up 06/07/15

## 2015-07-11 ENCOUNTER — Ambulatory Visit (INDEPENDENT_AMBULATORY_CARE_PROVIDER_SITE_OTHER): Payer: Medicare Other

## 2015-07-11 ENCOUNTER — Ambulatory Visit: Payer: Medicare Other

## 2015-07-11 ENCOUNTER — Ambulatory Visit (INDEPENDENT_AMBULATORY_CARE_PROVIDER_SITE_OTHER): Payer: Medicare Other | Admitting: Orthopedic Surgery

## 2015-07-11 VITALS — BP 134/85 | Ht 62.0 in | Wt 242.0 lb

## 2015-07-11 DIAGNOSIS — M25561 Pain in right knee: Secondary | ICD-10-CM

## 2015-07-11 DIAGNOSIS — M25562 Pain in left knee: Secondary | ICD-10-CM | POA: Diagnosis not present

## 2015-07-11 DIAGNOSIS — Z9889 Other specified postprocedural states: Secondary | ICD-10-CM | POA: Diagnosis not present

## 2015-07-11 MED ORDER — OXYCODONE-ACETAMINOPHEN 5-325 MG PO TABS: 1.0000 | ORAL_TABLET | ORAL | Status: DC | PRN

## 2015-07-12 NOTE — Progress Notes (Signed)
Patient ID: [name], female   DOB: [birth year], 53 y.o.   MRN: [id number]  ESTABLISHED PATIENT NEW PROBLEM   Chief Complaint  Patient presents with  . Follow-up    follow up bilateral knees s/p fall last month     [name] is a 54 y.o. female.   HPI This patient was just seen in August. She had a right total knee arthroplasty she ruptured her patella tendon postop had an Achilles tendon allograft reconstruction. She's currently on suppressive antibiotic therapy  She fell and injured her left knee and right knee. She comes in with a bruised right knee and pain in the left knee which is been evaluated in the past and found to be arthritic  She complains of right medial knee pain some motion restrictions with swelling in the left side exacerbation of chronic left medial knee pain with aching pain over the medial joint line nonradiating but associated with swelling. No neurovascular deficit vascular deficit  Review of systems no fever. No weakness.   Review of Systems See hpi  Past Medical History  Diagnosis Date  . Eczema   . Muscle spasm of back   . Shortness of breath     with exertion   . Arthritis   . Infection     r knee    Past Surgical History  Procedure Laterality Date  . Lipoma excision  11/16/2011    Procedure: EXCISION LIPOMA;  Surgeon: Scherry Ran, MD;  Location: [facility];  Service: General;  Laterality: Left;  Excision of neoplasm left shoulder  . Tubal ligation      and burned per patient.   . Multiple extractions with alveoloplasty  12/28/2011    Procedure: MULTIPLE EXTRACION WITH ALVEOLOPLASTY;  Surgeon: Gae Bon, DDS;  Location: [place];  Service: Oral Surgery;  Laterality: Bilateral;  . Mouth surgery    . Shoulder surgery Left   . 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 Joint replacement       r knee  . Patellar tendon repair Right 05/12/2013    Procedure: PATELLA TENDON REPAIR AND ALLOGRAFT RECONSTRUCTION;  Surgeon: Carole Civil, MD;  Location: [facility];  Service: Orthopedics;  Laterality: Right;  . Total knee arthroplasty Right 05/12/2013    Procedure: POLY EXCHANGE;  Surgeon: Carole Civil, MD;  Location: [facility];  Service: Orthopedics;  Laterality: Right;  . Irrigation and debridement knee Right 05/12/2013    Procedure: IRRIGATION AND DEBRIDEMENT KNEE;  Surgeon: Carole Civil, MD;  Location: [facility];  Service: Orthopedics;  Laterality: Right;    Family History  Problem Relation Age of Onset  . Arthritis    . Anesthesia problems Neg Hx   . Hypotension Neg Hx   . Malignant hyperthermia Neg Hx   . Pseudochol deficiency Neg Hx   . Diabetes Brother     Social History Social History  Substance Use Topics  . Smoking status: Former Smoker -- 0.10 packs/day for 1 years    Types: Cigarettes  . Smokeless tobacco: Never Used  . Alcohol Use: No    Allergies  Allergen Reactions  . Chocolate Hives    Current Outpatient Prescriptions  Medication Sig Dispense Refill  . clobetasol cream (TEMOVATE) [id number] % Apply 1 application topically 2 (two) times daily as needed (rash).    . cyclobenzaprine (FLEXERIL)  10 MG tablet Take 10 mg by mouth 3 (three) times daily as needed for muscle spasms.    Marland Kitchen ibuprofen (ADVIL,MOTRIN) 800 MG tablet Take 800 mg by mouth every 8 (eight) hours as needed.    Marland Kitchen ketoconazole (NIZORAL) 2 % cream Apply 1 application topically daily.    . megestrol (MEGACE) 40 MG tablet Take 1 tablet (40 mg total) by mouth daily. 30 tablet 11  . oxyCODONE-acetaminophen (PERCOCET/ROXICET) 5-325 MG tablet Take 1 tablet by mouth every 4 (four) hours as needed. 180 tablet 0  . Potassium 99 MG TABS Take by mouth.    . travoprost, benzalkonium, (TRAVATAN) 0.004 % ophthalmic solution Place 1 drop into both eyes at bedtime.     Marland Kitchen zolpidem (AMBIEN) 10 MG tablet Take  10 mg by mouth at bedtime as needed for sleep.     No current facility-administered medications for this visit.       Physical Exam Blood pressure 134/85, height 5\' 2"  (1.575 m), weight 242 lb (109.77 kg), last menstrual period 01/15/2012. Physical Exam The patient is well developed well nourished and well groomed. Orientation to person place and time is normal  Mood is pleasant. Ambulatory status supported by assistive devices but she is limping  The right knee has a bruise over the front medial aspect. Her passive motion is 95 with full extension weakness on extension. This is chronic. Joint feels stable  Left knee medial joint line tenderness small joint effusion range of motion 115 ligaments feel stable.  Distal neurovascular exam is intact in both lower extremities  Skin remains intact without laceration ulceration or erythema Gross motor exam is intact without atrophy. Muscle tone normal grade 5 motor strength   Data Reviewed  I have independently reviewed the radiographs and my interpretation is:  Right knee stable implant with multiple areas of radiolucent line secondary to infection  Left knee degenerative arthritis   Assessment   I injected the left knee with cortisone  Procedure note left knee injection verbal consent was obtained to inject left knee joint  Timeout was completed to confirm the site of injection  The medications used were 40 mg of Depo-Medrol and 1% lidocaine 3 cc  Anesthesia was provided by ethyl chloride and the skin was prepped with alcohol.  After cleaning the skin with alcohol a 20-gauge needle was used to inject the left knee joint. There were no complications. A sterile bandage was applied.    Plan   Conservative care at this point no fractures were seen. Managed with medication and rest appropriate supportive the joint and follow-up.

## 2015-08-06 ENCOUNTER — Telehealth: Payer: Self-pay | Admitting: Orthopedic Surgery

## 2015-08-06 NOTE — Telephone Encounter (Signed)
Patient called for refill of pain medication - states her due date falls over weekend.  Please advise, patient ph# is [phone]: oxyCODONE-acetaminophen (PERCOCET/ROXICET) 5-325 MG tablet [id number]

## 2015-08-07 ENCOUNTER — Other Ambulatory Visit: Payer: Self-pay | Admitting: *Deleted

## 2015-08-07 DIAGNOSIS — Z9889 Other specified postprocedural states: Secondary | ICD-10-CM

## 2015-08-07 MED ORDER — OXYCODONE-ACETAMINOPHEN 5-325 MG PO TABS: 1.0000 | ORAL_TABLET | ORAL | Status: DC | PRN

## 2015-08-08 NOTE — Telephone Encounter (Signed)
Prescription available, patient aware  

## 2015-08-12 ENCOUNTER — Ambulatory Visit: Payer: Medicare Other | Admitting: Orthopedic Surgery

## 2015-08-19 ENCOUNTER — Other Ambulatory Visit: Payer: Self-pay | Admitting: Obstetrics & Gynecology

## 2015-08-20 ENCOUNTER — Encounter: Payer: Self-pay | Admitting: Orthopedic Surgery

## 2015-08-20 ENCOUNTER — Ambulatory Visit (INDEPENDENT_AMBULATORY_CARE_PROVIDER_SITE_OTHER): Payer: Medicare Other | Admitting: Orthopedic Surgery

## 2015-08-20 VITALS — BP 135/85 | Ht 62.0 in | Wt 242.0 lb

## 2015-08-20 DIAGNOSIS — T847XXA Infection and inflammatory reaction due to other internal orthopedic prosthetic devices, implants and grafts, initial encounter: Secondary | ICD-10-CM | POA: Diagnosis not present

## 2015-08-20 MED ORDER — DOXYCYCLINE HYCLATE 100 MG PO TABS: 100.0000 mg | ORAL_TABLET | Freq: Two times a day (BID) | ORAL | Status: DC

## 2015-08-20 NOTE — Patient Instructions (Signed)
Please get blood work  Antibiotic sent to your pharmacy  Continue warm compresses  Follow up in 1 week

## 2015-08-20 NOTE — Progress Notes (Signed)
[name] presents as an established patient with ongoing problems with her right knee  She complains of a knot over the right knee and actually has a wound there now  Review she had a right total knee arthroplasty rupture patella tendon and had an Achilles tendon allograft reconstruction she was on suppressive antibiotic therapy when she was found to have infection. She was followed at another clinic and the infection was thought to be quiesced and she was taken off antibiotics  She now presents with a wound complaining of purulent drainage which was treated with peroxide and dressing care by the patient herself  Review of systems she does not have fever or chills or malaise. Skin wound as noted no neurologic deficits  Past Medical History  Diagnosis Date  . Eczema   . Muscle spasm of back   . Shortness of breath     with exertion   . Arthritis   . Infection     r knee   BP 135/85 mmHg  Ht 5\' 2"  (1.575 m)  Wt 242 lb (109.77 kg)  BMI 44.25 kg/m2  LMP 01/15/2012   Last visit x-ray showed a stable implant with multiple areas a radiolucent line secondary to infection  The patient will follow-up with the physician that she was referred to by phone and if necessary I will call.  The knee is not warm this appears to be a sub-cutaneous above the fascia infection. It is approximately 5 x 8 mm Knee flexion remains approximately 110-115 degrees motor exam intact knee stable neurovascular exam normal  She is awake alert and oriented 3 mood is normal amplitude 3 with support general appearance good hygiene    I placed her on doxycycline 100 mg twice a day she will continue peroxide dressing changes she'll follow me weekly we will get a sedimentation rate C-reactive protein and CBC

## 2015-08-21 ENCOUNTER — Telehealth: Payer: Self-pay | Admitting: Orthopedic Surgery

## 2015-08-21 ENCOUNTER — Telehealth: Payer: Self-pay | Admitting: *Deleted

## 2015-08-21 LAB — C-REACTIVE PROTEIN: CRP: 7.2 mg/dL — AB (ref <0.60)

## 2015-08-21 NOTE — Telephone Encounter (Signed)
Spoke with [name] at Dr. Doristine Section office and she states their office will call patient to schedule appointment. Also stated that patient has a history of missing multiple visits.

## 2015-08-21 NOTE — Telephone Encounter (Signed)
Call received from [facility] at [facility], ph# [phone], stating that patient called and reached out to them, relaying that she saw Dr Aline Brochure yesterday, 08/20/15, and that she was to contact their office, since she is also their patient.  Estill Bamberg relayed that patient was last seen there in July of 2016, by Dr. Starlyn Skeans, and that surgery was recommended and scheduled, however was then cancelled.  Their office therefore is asking what Dr Aline Brochure would like them to do for the patient. Please advise (ph# above).

## 2015-08-21 NOTE — Telephone Encounter (Signed)
On 08/09/15, patient picked up prescription.

## 2015-08-21 NOTE — Telephone Encounter (Signed)
She has ride issues

## 2015-08-21 NOTE — Telephone Encounter (Signed)
both

## 2015-08-21 NOTE — Telephone Encounter (Signed)
[name] called back from Dr. Doristine Section office [phone]) at [facility]. States patient had consult with their office but opted not to have surgery, patient was then seen by Infectious disease ([phone]). Do you want patient to have a consult with Dr. Francisca December office again regarding revision or consult with infectious disease?

## 2015-08-22 ENCOUNTER — Ambulatory Visit: Payer: Medicare Other | Admitting: Orthopedic Surgery

## 2015-08-22 LAB — CBC WITH DIFFERENTIAL/PLATELET
BASOS ABS: 0.2 10*3/uL — AB (ref 0.0–0.1)
Basophils Relative: 1 % (ref 0–1)
EOS ABS: 0.2 10*3/uL (ref 0.0–0.7)
EOS PCT: 1 % (ref 0–5)
HCT: 33.3 % — ABNORMAL LOW (ref 36.0–46.0)
Hemoglobin: 10.6 g/dL — ABNORMAL LOW (ref 12.0–15.0)
LYMPHS PCT: 18 % (ref 12–46)
Lymphs Abs: 3.1 10*3/uL (ref 0.7–4.0)
MCH: 23.2 pg — ABNORMAL LOW (ref 26.0–34.0)
MCHC: 31.8 g/dL (ref 30.0–36.0)
MCV: 73 fL — ABNORMAL LOW (ref 78.0–100.0)
MPV: 9.4 fL (ref 8.6–12.4)
Monocytes Absolute: 1.4 10*3/uL — ABNORMAL HIGH (ref 0.1–1.0)
Monocytes Relative: 8 % (ref 3–12)
Neutro Abs: 12.2 10*3/uL — ABNORMAL HIGH (ref 1.7–7.7)
Neutrophils Relative %: 72 % (ref 43–77)
PLATELETS: 678 10*3/uL — AB (ref 150–400)
RBC: 4.56 MIL/uL (ref 3.87–5.11)
RDW: 18.7 % — AB (ref 11.5–15.5)
WBC: 17 10*3/uL — ABNORMAL HIGH (ref 4.0–10.5)

## 2015-08-22 LAB — SEDIMENTATION RATE: Sed Rate: 15 mm/hr (ref 0–30)

## 2015-08-22 NOTE — Telephone Encounter (Signed)
Requests for new consults faxed to both offices

## 2015-08-27 ENCOUNTER — Ambulatory Visit: Payer: Medicare Other | Admitting: Orthopedic Surgery

## 2015-08-29 ENCOUNTER — Ambulatory Visit (INDEPENDENT_AMBULATORY_CARE_PROVIDER_SITE_OTHER): Payer: Medicare Other | Admitting: Orthopedic Surgery

## 2015-08-29 VITALS — BP 120/74 | Ht 62.0 in | Wt 242.0 lb

## 2015-08-29 DIAGNOSIS — L03115 Cellulitis of right lower limb: Secondary | ICD-10-CM

## 2015-08-29 DIAGNOSIS — T847XXA Infection and inflammatory reaction due to other internal orthopedic prosthetic devices, implants and grafts, initial encounter: Secondary | ICD-10-CM

## 2015-08-29 NOTE — Patient Instructions (Signed)
Continue antibiotic

## 2015-08-29 NOTE — Progress Notes (Signed)
Chief Complaint  Patient presents with  . Follow-up    1 week recheck on right knee wound and review labs.    Review of her history She complains of a knot over the right knee and actually has a wound there now  Review she had a right total knee arthroplasty rupture patella tendon and had an Achilles tendon allograft reconstruction she was on suppressive antibiotic therapy when she was found to have infection. She was followed at another clinic and the infection was thought to be quiesced and she was taken off antibiotics  She now presents with a wound complaining of purulent drainage which was treated with peroxide and dressing care by the patient herself  She is on doxycycline 100 mg twice a day local wound care with hydrogen peroxide the area of induration and cellulitis is decreased approximately 50%  She will continue on the current regimen and follow-up with me in a week.  Her lab results showed a white count is 17 sedimentation rate of 15 which is within normal limits but her C-reactive protein was 7   Encounter Diagnoses  Name Primary?  . Cellulitis of right lower extremity Yes  . Infection of orthopedic implant, initial encounter ([place])

## 2015-09-03 ENCOUNTER — Other Ambulatory Visit: Payer: Medicare Other | Admitting: Obstetrics & Gynecology

## 2015-09-05 ENCOUNTER — Ambulatory Visit (INDEPENDENT_AMBULATORY_CARE_PROVIDER_SITE_OTHER): Payer: Medicare Other | Admitting: Orthopedic Surgery

## 2015-09-05 VITALS — BP 145/84 | Ht 62.0 in | Wt 242.0 lb

## 2015-09-05 DIAGNOSIS — Z9889 Other specified postprocedural states: Secondary | ICD-10-CM

## 2015-09-05 DIAGNOSIS — T847XXA Infection and inflammatory reaction due to other internal orthopedic prosthetic devices, implants and grafts, initial encounter: Secondary | ICD-10-CM | POA: Diagnosis not present

## 2015-09-05 MED ORDER — OXYCODONE-ACETAMINOPHEN 5-325 MG PO TABS: 1.0000 | ORAL_TABLET | ORAL | Status: DC | PRN

## 2015-09-05 NOTE — Telephone Encounter (Signed)
09/16/14 appt with infectious disease

## 2015-09-05 NOTE — Progress Notes (Signed)
Chief Complaint  Patient presents with  . Follow-up    1 week recheck on right knee wound.    [name] comes in for follow-up wound check the wound is getting smaller the area of induration is getting smaller she is nontoxic lichen [id number] mg twice a day treating her wound with hydrogen peroxide and doing a good job  She will see I think infectious disease on the 17th call me after that appointment and then she is awaiting surgery but the doctor who is operating once her to get an apartment that she can go back to after surgery so she's been waiting on that for months  My plan is to continue current management

## 2015-09-09 ENCOUNTER — Other Ambulatory Visit: Payer: Medicare Other | Admitting: Obstetrics & Gynecology

## 2015-09-16 ENCOUNTER — Other Ambulatory Visit: Payer: Self-pay | Admitting: *Deleted

## 2015-09-16 MED ORDER — DOXYCYCLINE HYCLATE 100 MG PO TABS: 100.0000 mg | ORAL_TABLET | Freq: Two times a day (BID) | ORAL | Status: DC

## 2015-09-16 NOTE — Telephone Encounter (Signed)
Patient was advised by Dr Gildardo Pounds office will schedule appt when patient is ready for surgery

## 2015-09-23 ENCOUNTER — Other Ambulatory Visit: Payer: Medicare Other | Admitting: Obstetrics & Gynecology

## 2015-10-07 ENCOUNTER — Other Ambulatory Visit: Payer: Self-pay | Admitting: *Deleted

## 2015-10-07 ENCOUNTER — Telehealth: Payer: Self-pay | Admitting: *Deleted

## 2015-10-07 DIAGNOSIS — Z9889 Other specified postprocedural states: Secondary | ICD-10-CM

## 2015-10-07 MED ORDER — OXYCODONE-ACETAMINOPHEN 5-325 MG PO TABS: 1.0000 | ORAL_TABLET | ORAL | Status: DC | PRN

## 2015-10-07 NOTE — Telephone Encounter (Signed)
Patient called stating she needs her hydrocodone needs to be refilled. Please advise.

## 2015-10-07 NOTE — Telephone Encounter (Signed)
Patient is aware that her rx is ready to be picked up

## 2015-10-09 NOTE — Telephone Encounter (Signed)
Patient collected prescription

## 2015-10-10 ENCOUNTER — Ambulatory Visit: Payer: Medicare Other | Admitting: Orthopedic Surgery

## 2015-10-17 ENCOUNTER — Other Ambulatory Visit (HOSPITAL_COMMUNITY)
Admission: RE | Admit: 2015-10-17 | Discharge: 2015-10-17 | Disposition: A | Discharge status: Home / Self Care | Payer: Medicare Other | Source: Ambulatory Visit | Attending: Obstetrics & Gynecology | Admitting: Obstetrics & Gynecology

## 2015-10-17 ENCOUNTER — Ambulatory Visit (INDEPENDENT_AMBULATORY_CARE_PROVIDER_SITE_OTHER): Payer: Medicare Other | Admitting: Obstetrics & Gynecology

## 2015-10-17 ENCOUNTER — Encounter: Payer: Self-pay | Admitting: Obstetrics & Gynecology

## 2015-10-17 VITALS — BP 130/80 | HR 78 | Ht 62.0 in | Wt 243.0 lb

## 2015-10-17 DIAGNOSIS — Z01419 Encounter for gynecological examination (general) (routine) without abnormal findings: Secondary | ICD-10-CM

## 2015-10-17 DIAGNOSIS — Z1212 Encounter for screening for malignant neoplasm of rectum: Secondary | ICD-10-CM

## 2015-10-17 DIAGNOSIS — Z1211 Encounter for screening for malignant neoplasm of colon: Secondary | ICD-10-CM

## 2015-10-17 DIAGNOSIS — Z124 Encounter for screening for malignant neoplasm of cervix: Secondary | ICD-10-CM | POA: Insufficient documentation

## 2015-10-17 DIAGNOSIS — Z0000 Encounter for general adult medical examination without abnormal findings: Secondary | ICD-10-CM | POA: Diagnosis not present

## 2015-10-17 NOTE — Progress Notes (Signed)
Patient ID: [name], female   DOB: 07-27-1961, 55 y.o.   MRN: [id number] Subjective:     [name] is a 55 y.o. female here for a routine exam.  Patient's last menstrual period was 01/15/2012. [id number] Birth Control Method:  BTL Menstrual Calendar(currently): regular  Current complaints: none.   Current acute medical issues:     Recent Gynecologic History Patient's last menstrual period was 01/15/2012. Last Pap: 2015,  normal Last mammogram: 2015,  normal  Past Medical History  Diagnosis Date  . Eczema   . Muscle spasm of back   . Shortness of breath     with exertion   . Arthritis   . Infection     r knee    Past Surgical History  Procedure Laterality Date  . Lipoma excision  11/16/2011    Procedure: EXCISION LIPOMA;  Surgeon: Scherry Ran, MD;  Location: [facility];  Service: General;  Laterality: Left;  Excision of neoplasm left shoulder  . Tubal ligation      and burned per patient.   . Multiple extractions with alveoloplasty  12/28/2011    Procedure: MULTIPLE EXTRACION WITH ALVEOLOPLASTY;  Surgeon: Gae Bon, DDS;  Location: [facility];  Service: Oral Surgery;  Laterality: Bilateral;  . Mouth surgery    . Shoulder surgery Left   . 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 Joint replacement      r knee  . Patellar tendon repair Right 05/12/2013    Procedure: PATELLA TENDON REPAIR AND ALLOGRAFT RECONSTRUCTION;  Surgeon: Carole Civil, MD;  Location: [facility];  Service: Orthopedics;  Laterality: Right;  . Total knee arthroplasty Right 05/12/2013    Procedure: POLY EXCHANGE;  Surgeon: Carole Civil, MD;  Location: [facility];  Service: Orthopedics;  Laterality: Right;  . Irrigation and debridement knee Right 05/12/2013    Procedure: IRRIGATION AND DEBRIDEMENT KNEE;  Surgeon: Carole Civil, MD;  Location: [facility];  Service: Orthopedics;  Laterality: Right;    OB History    Gravida Para Term Preterm AB TAB SAB Ectopic Multiple Living   5 5 5      1 6       Social History   Social History  . Marital Status: Single    Spouse Name: N/A  . Number of Children: N/A  . Years of Education: 12   Occupational History  .     Social History Main Topics  . Smoking status: Former Smoker -- 0.10 packs/day for 1 years    Types: Cigarettes  . Smokeless tobacco: Never Used  . Alcohol Use: No  . Drug Use: No  . Sexual Activity: Yes   Other Topics Concern  . None   Social History Narrative    Family History  Problem Relation Age of Onset  . Arthritis    . Anesthesia problems Neg Hx   . Hypotension Neg Hx   . Malignant hyperthermia Neg Hx   . Pseudochol deficiency Neg Hx   . Diabetes Brother      Current outpatient prescriptions:  .  clobetasol cream (TEMOVATE) AB-123456789 %, Apply 1 application topically 2 (two) times daily as needed (rash)., Disp: , Rfl:  .  cyclobenzaprine (FLEXERIL) 10 MG tablet, Take 10 mg by mouth 3 (three) times daily as needed for muscle spasms., Disp: , Rfl:  .  doxycycline (VIBRA-TABS) 100 MG tablet, Take 1 tablet (100 mg total) by mouth 2 (two) times daily., Disp: 60 tablet, Rfl: 0 .  ibuprofen (ADVIL,MOTRIN) 800 MG tablet, Take 800 mg by mouth every 8 (eight) hours as needed., Disp: , Rfl:  .  ketoconazole (NIZORAL) 2 % cream, Apply 1 application topically daily., Disp: , Rfl:  .  megestrol (MEGACE) 40 MG tablet, TAKE 1 TABLET BY MOUTH ONCE DAILY., Disp: 30 tablet, Rfl: 11 .  oxyCODONE-acetaminophen (PERCOCET/ROXICET) 5-325 MG tablet, Take 1 tablet by mouth every 4 (four) hours as needed., Disp: 180 tablet, Rfl: 0 .  travoprost, benzalkonium, (TRAVATAN) 0.004 % ophthalmic solution, Place 1 drop into both eyes at bedtime. , Disp: , Rfl:  .  zolpidem (AMBIEN) 10 MG tablet, Take 10 mg by mouth at bedtime as needed for sleep., Disp: , Rfl:   Review of Systems  Review  of Systems  Constitutional: Negative for fever, chills, weight loss, malaise/fatigue and diaphoresis.  HENT: Negative for hearing loss, ear pain, nosebleeds, congestion, sore throat, neck pain, tinnitus and ear discharge.   Eyes: Negative for blurred vision, double vision, photophobia, pain, discharge and redness.  Respiratory: Negative for cough, hemoptysis, sputum production, shortness of breath, wheezing and stridor.   Cardiovascular: Negative for chest pain, palpitations, orthopnea, claudication, leg swelling and PND.  Gastrointestinal: negative for abdominal pain. Negative for heartburn, nausea, vomiting, diarrhea, constipation, blood in stool and melena.  Genitourinary: Negative for dysuria, urgency, frequency, hematuria and flank pain.  Musculoskeletal: Negative for myalgias, back pain, joint pain and falls.  Skin: Negative for itching and rash.  Neurological: Negative for dizziness, tingling, tremors, sensory change, speech change, focal weakness, seizures, loss of consciousness, weakness and headaches.  Endo/Heme/Allergies: Negative for environmental allergies and polydipsia. Does not bruise/bleed easily.  Psychiatric/Behavioral: Negative for depression, suicidal ideas, hallucinations, memory loss and substance abuse. The patient is not nervous/anxious and does not have insomnia.        Objective:  Blood pressure 130/80, pulse 78, height 5\' 2"  (1.575 m), weight 243 lb (110.224 kg), last menstrual period 01/15/2012.   Physical Exam  Vitals reviewed. Constitutional: She is oriented to person, place, and time. She appears well-developed and well-nourished.  HENT:  Head: Normocephalic and atraumatic.        Right Ear: External ear normal.  Left Ear: External ear normal.  Nose: Nose normal.  Mouth/Throat: Oropharynx is clear and moist.  Eyes: Conjunctivae and EOM are normal. Pupils are equal, round, and reactive to light. Right eye exhibits no discharge. Left eye exhibits no discharge.  No scleral icterus.  Neck: Normal range of motion. Neck supple. No tracheal deviation present. No thyromegaly present.  Cardiovascular: Normal rate, regular rhythm, normal heart sounds and intact distal pulses.  Exam reveals no gallop and no friction rub.   No murmur heard. Respiratory: Effort normal and breath sounds normal. No respiratory distress. She has no wheezes. She has no rales. She exhibits no tenderness.  GI: Soft. Bowel sounds are normal. She exhibits no distension and no mass. There is no tenderness. There is no rebound and no guarding.  Genitourinary:  Breasts no masses skin changes or nipple changes bilaterally      Vulva is normal without lesions Vagina is pink moist without discharge Cervix normal in appearance and pap is done Uterus is normal size shape and contour Adnexa is negative with normal sized ovaries  {Rectal    hemoccult negative, normal tone, no masses  Musculoskeletal: Normal range of motion. She exhibits  no edema and no tenderness.  Neurological: She is alert and oriented to person, place, and time. She has normal reflexes. She displays normal reflexes. No cranial nerve deficit. She exhibits normal muscle tone. Coordination normal.  Skin: Skin is warm and dry. No rash noted. No erythema. No pallor.  Psychiatric: She has a normal mood and affect. Her behavior is normal. Judgment and thought content normal.       Assessment:    Healthy female exam.    Plan:    Mammogram ordered. Follow up in: 1 year.    No orders of the defined types were placed in this encounter.    No orders of the defined types were placed in this encounter.

## 2015-10-18 ENCOUNTER — Other Ambulatory Visit: Payer: Self-pay | Admitting: Obstetrics & Gynecology

## 2015-10-18 DIAGNOSIS — Z1231 Encounter for screening mammogram for malignant neoplasm of breast: Secondary | ICD-10-CM

## 2015-10-21 ENCOUNTER — Other Ambulatory Visit (HOSPITAL_COMMUNITY)
Admission: RE | Admit: 2015-10-21 | Discharge: 2015-10-21 | Disposition: A | Discharge status: Home / Self Care | Payer: Medicare Other | Source: Other Acute Inpatient Hospital | Attending: Internal Medicine | Admitting: Internal Medicine

## 2015-10-21 DIAGNOSIS — Z029 Encounter for administrative examinations, unspecified: Secondary | ICD-10-CM | POA: Diagnosis present

## 2015-10-21 LAB — CBC WITH DIFFERENTIAL/PLATELET
BASOS ABS: 0.1 10*3/uL (ref 0.0–0.1)
BASOS PCT: 1 %
EOS PCT: 3 %
Eosinophils Absolute: 0.3 10*3/uL (ref 0.0–0.7)
HEMATOCRIT: 31.1 % — AB (ref 36.0–46.0)
HEMOGLOBIN: 9.9 g/dL — AB (ref 12.0–15.0)
Lymphocytes Relative: 23 %
Lymphs Abs: 2.6 10*3/uL (ref 0.7–4.0)
MCH: 23.7 pg — ABNORMAL LOW (ref 26.0–34.0)
MCHC: 31.8 g/dL (ref 30.0–36.0)
MCV: 74.4 fL — AB (ref 78.0–100.0)
MONO ABS: 0.8 10*3/uL (ref 0.1–1.0)
Monocytes Relative: 7 %
Neutro Abs: 7.3 10*3/uL (ref 1.7–7.7)
Neutrophils Relative %: 66 %
Platelets: 566 10*3/uL — ABNORMAL HIGH (ref 150–400)
RBC: 4.18 MIL/uL (ref 3.87–5.11)
RDW: 18.7 % — ABNORMAL HIGH (ref 11.5–15.5)
WBC: 11.1 10*3/uL — AB (ref 4.0–10.5)

## 2015-10-21 LAB — SEDIMENTATION RATE: SED RATE: 68 mm/h — AB (ref 0–22)

## 2015-10-21 LAB — BUN: BUN: 11 mg/dL (ref 6–20)

## 2015-10-21 LAB — CREATININE, SERUM
CREATININE: 0.63 mg/dL (ref 0.44–1.00)
GFR calc Af Amer: >60 mL/min (ref >60)

## 2015-10-21 LAB — CYTOLOGY - PAP

## 2015-10-21 LAB — VANCOMYCIN, TROUGH: VANCOMYCIN TR: 23 ug/mL — AB (ref 10.0–20.0)

## 2015-10-29 ENCOUNTER — Other Ambulatory Visit (HOSPITAL_COMMUNITY)
Admission: AD | Admit: 2015-10-29 | Discharge: 2015-10-29 | Disposition: A | Discharge status: Home / Self Care | Payer: Medicare Other | Source: Other Acute Inpatient Hospital | Attending: Family Medicine | Admitting: Family Medicine

## 2015-10-29 DIAGNOSIS — Z029 Encounter for administrative examinations, unspecified: Secondary | ICD-10-CM | POA: Diagnosis present

## 2015-10-29 LAB — SEDIMENTATION RATE: Sed Rate: 58 mm/hr — ABNORMAL HIGH (ref 0–22)

## 2015-10-29 LAB — CBC WITH DIFFERENTIAL/PLATELET
BASOS PCT: 1 %
Basophils Absolute: 0.1 10*3/uL (ref 0.0–0.1)
EOS ABS: 0.4 10*3/uL (ref 0.0–0.7)
EOS PCT: 3 %
HCT: 31.9 % — ABNORMAL LOW (ref 36.0–46.0)
Hemoglobin: 10.1 g/dL — ABNORMAL LOW (ref 12.0–15.0)
Lymphocytes Relative: 21 %
Lymphs Abs: 2.7 10*3/uL (ref 0.7–4.0)
MCH: 23.7 pg — AB (ref 26.0–34.0)
MCHC: 31.7 g/dL (ref 30.0–36.0)
MCV: 74.9 fL — ABNORMAL LOW (ref 78.0–100.0)
MONO ABS: 0.7 10*3/uL (ref 0.1–1.0)
MONOS PCT: 6 %
Neutro Abs: 8.7 10*3/uL — ABNORMAL HIGH (ref 1.7–7.7)
Neutrophils Relative %: 69 %
Platelets: 569 10*3/uL — ABNORMAL HIGH (ref 150–400)
RBC: 4.26 MIL/uL (ref 3.87–5.11)
RDW: 18.5 % — AB (ref 11.5–15.5)
WBC: 12.5 10*3/uL — ABNORMAL HIGH (ref 4.0–10.5)

## 2015-10-29 LAB — VANCOMYCIN, TROUGH: Vancomycin Tr: 20 ug/mL (ref 10.0–20.0)

## 2015-10-29 LAB — CREATININE, SERUM
CREATININE: 0.73 mg/dL (ref 0.44–1.00)
GFR calc Af Amer: >60 mL/min (ref >60)

## 2015-10-29 LAB — BUN: BUN: 15 mg/dL (ref 6–20)

## 2015-11-04 ENCOUNTER — Other Ambulatory Visit: Payer: Self-pay | Admitting: *Deleted

## 2015-11-04 ENCOUNTER — Telehealth: Payer: Self-pay | Admitting: Orthopedic Surgery

## 2015-11-04 DIAGNOSIS — Z9889 Other specified postprocedural states: Secondary | ICD-10-CM

## 2015-11-04 MED ORDER — OXYCODONE-ACETAMINOPHEN 5-325 MG PO TABS: 1.0000 | ORAL_TABLET | ORAL | Status: DC | PRN

## 2015-11-04 NOTE — Telephone Encounter (Signed)
Patient requesting refill on Oxycodone / Acetaminophen 5/325mg   Qty 180 Tablets

## 2015-11-04 NOTE — Telephone Encounter (Signed)
Prescription available, called patient, no answer 

## 2015-11-11 ENCOUNTER — Ambulatory Visit (INDEPENDENT_AMBULATORY_CARE_PROVIDER_SITE_OTHER): Payer: Medicare Other | Admitting: Orthopedic Surgery

## 2015-11-11 VITALS — BP 99/81 | Ht 62.0 in | Wt 243.0 lb

## 2015-11-11 DIAGNOSIS — T847XXA Infection and inflammatory reaction due to other internal orthopedic prosthetic devices, implants and grafts, initial encounter: Secondary | ICD-10-CM | POA: Diagnosis not present

## 2015-11-11 DIAGNOSIS — Z9889 Other specified postprocedural states: Secondary | ICD-10-CM

## 2015-11-11 DIAGNOSIS — Z96651 Presence of right artificial knee joint: Secondary | ICD-10-CM | POA: Diagnosis not present

## 2015-11-11 DIAGNOSIS — M25562 Pain in left knee: Secondary | ICD-10-CM | POA: Diagnosis not present

## 2015-11-11 MED ORDER — PREDNISONE 10 MG PO TABS: 10.0000 mg | ORAL_TABLET | Freq: Three times a day (TID) | ORAL | Status: DC

## 2015-11-11 NOTE — Progress Notes (Signed)
Patient ID: [name], female   DOB: 1960-11-07, 55 y.o.   MRN: [id number]  Chief Complaint  Patient presents with  . Follow-up    6 month recheck on right knee, DOS 04/17/13.   Right knee re-instituted antibiotics via PICC line wound healed  Left knee pain swelling times several weeks including night pain pain actually now worse on the left than the right no trauma. Diffuse tenderness and swelling.  Encounter Diagnoses  Name Primary?  . S/P knee surgery Yes  . Infection of orthopedic implant, initial encounter ([place])   . Left knee pain   . History of right knee joint replacement     Past Medical History  Diagnosis Date  . Eczema   . Muscle spasm of back   . Shortness of breath     with exertion   . Arthritis   . Infection     r knee    Review of Systems  Constitutional: Negative for fever and chills.  Musculoskeletal: Positive for back pain.  Neurological: Negative for tingling.    BP 99/81 mmHg  Ht 5\' 2"  (1.575 m)  Wt 243 lb (110.224 kg)  BMI 44.43 kg/m2  LMP 01/15/2012 Physical Exam  Constitutional: She is oriented to person, place, and time and well-developed, well-nourished, and in no distress. She appears well-developed and well-nourished. No distress.  Cardiovascular: Normal rate and intact distal pulses.   Neurological: She is alert and oriented to person, place, and time. She has normal reflexes. She exhibits normal muscle tone. Coordination normal.  Skin: Skin is warm and dry. No rash noted. She is not diaphoretic. No erythema. No pallor.  Psychiatric: She has a normal mood and affect. Her behavior is normal. Judgment and thought content normal.   Prior right knee wound has healed no drainage. Left knee limited flexion, ligament stable muscle tone normal no swelling no tenderness no redness no erythema  Cane is used for ambulation Left knee  Flexion is 120  Diffuse tenderness with synovial hypertrophy without effusion  Ligaments are stable  Strength is normal muscle tone is normal      ASSESSMENT AND PLAN   Left knee pain synovitis  Injection  10 mg prednisone 3 times a day  Procedure note left knee injection verbal consent was obtained to inject left knee joint  Timeout was completed to confirm the site of injection  The medications used were 40 mg of Depo-Medrol and 1% lidocaine 3 cc  Anesthesia was provided by ethyl chloride and the skin was prepped with alcohol.  After cleaning the skin with alcohol a 20-gauge needle was used to inject the left knee joint. There were no complications. A sterile bandage was applied.   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  Left knee pain synovitis

## 2015-11-11 NOTE — Patient Instructions (Signed)
Joint Injection  Care After  Refer to this sheet in the next few days. These instructions provide you with information on caring for yourself after you have had a joint injection. Your caregiver also may give you more specific instructions. Your treatment has been planned according to current medical practices, but problems sometimes occur. Call your caregiver if you have any problems or questions after your procedure.  After any type of joint injection, it is not uncommon to experience:  · Soreness, swelling, or bruising around the injection site.  · Mild numbness, tingling, or weakness around the injection site caused by the numbing medicine used before or with the injection.  It also is possible to experience the following effects associated with the specific agent after injection:  · Iodine-based contrast agents:  ¨ Allergic reaction (itching, hives, widespread redness, and swelling beyond the injection site).  · Corticosteroids (These effects are rare.):  ¨ Allergic reaction.  ¨ Increased blood sugar levels (If you have diabetes and you notice that your blood sugar levels have increased, notify your caregiver).  ¨ Increased blood pressure levels.  ¨ Mood swings.  · Hyaluronic acid in the use of viscosupplementation.  ¨ Temporary heat or redness.  ¨ Temporary rash and itching.  ¨ Increased fluid accumulation in the injected joint.  These effects all should resolve within a day after your procedure.   HOME CARE INSTRUCTIONS  · Limit yourself to light activity the day of your procedure. Avoid lifting heavy objects, bending, stooping, or twisting.  · Take prescription or over-the-counter pain medication as directed by your caregiver.  · You may apply ice to your injection site to reduce pain and swelling the day of your procedure. Ice may be applied 03-04 times:  ¨ Put ice in a plastic bag.  ¨ Place a towel between your skin and the bag.  ¨ Leave the ice on for no longer than 15-20 minutes each time.  SEEK  IMMEDIATE MEDICAL CARE IF:   · Pain and swelling get worse rather than better or extend beyond the injection site.  · Numbness does not go away.  · Blood or fluid continues to leak from the injection site.  · You have chest pain.  · You have swelling of your face or tongue.  · You have trouble breathing or you become dizzy.  · You develop a fever, chills, or severe tenderness at the injection site that last longer than 1 day.  MAKE SURE YOU:  · Understand these instructions.  · Watch your condition.  · Get help right away if you are not doing well or if you get worse.  Document Released: 04/30/2011 Document Revised: 11/09/2011 Document Reviewed: 04/30/2011  Exit[employer]® Patient Information ©2015 [employer]. This information is not intended to replace advice given to you by your health care provider. Make sure you discuss any questions you have with your health care provider.

## 2015-11-25 ENCOUNTER — Ambulatory Visit (HOSPITAL_COMMUNITY)
Admission: RE | Admit: 2015-11-25 | Discharge: 2015-11-25 | Disposition: A | Discharge status: Home / Self Care | Payer: Medicare Other | Source: Ambulatory Visit | Attending: Obstetrics & Gynecology | Admitting: Obstetrics & Gynecology

## 2015-11-25 DIAGNOSIS — Z1231 Encounter for screening mammogram for malignant neoplasm of breast: Secondary | ICD-10-CM | POA: Diagnosis not present

## 2015-12-06 ENCOUNTER — Emergency Department (HOSPITAL_COMMUNITY): Payer: Medicare Other

## 2015-12-06 ENCOUNTER — Emergency Department (HOSPITAL_COMMUNITY)
Admission: EM | Admit: 2015-12-06 | Discharge: 2015-12-06 | Disposition: A | Discharge status: Home / Self Care | Payer: Medicare Other | Attending: Emergency Medicine | Admitting: Emergency Medicine

## 2015-12-06 ENCOUNTER — Encounter (HOSPITAL_COMMUNITY): Payer: Self-pay | Admitting: Emergency Medicine

## 2015-12-06 DIAGNOSIS — Z791 Long term (current) use of non-steroidal anti-inflammatories (NSAID): Secondary | ICD-10-CM | POA: Insufficient documentation

## 2015-12-06 DIAGNOSIS — M1990 Unspecified osteoarthritis, unspecified site: Secondary | ICD-10-CM | POA: Insufficient documentation

## 2015-12-06 DIAGNOSIS — Z79891 Long term (current) use of opiate analgesic: Secondary | ICD-10-CM | POA: Insufficient documentation

## 2015-12-06 DIAGNOSIS — J069 Acute upper respiratory infection, unspecified: Secondary | ICD-10-CM | POA: Insufficient documentation

## 2015-12-06 DIAGNOSIS — J029 Acute pharyngitis, unspecified: Secondary | ICD-10-CM | POA: Diagnosis present

## 2015-12-06 DIAGNOSIS — F17210 Nicotine dependence, cigarettes, uncomplicated: Secondary | ICD-10-CM | POA: Insufficient documentation

## 2015-12-06 DIAGNOSIS — B370 Candidal stomatitis: Secondary | ICD-10-CM | POA: Insufficient documentation

## 2015-12-06 DIAGNOSIS — Z79899 Other long term (current) drug therapy: Secondary | ICD-10-CM | POA: Insufficient documentation

## 2015-12-06 LAB — RAPID STREP SCREEN (MED CTR MEBANE ONLY): STREPTOCOCCUS, GROUP A SCREEN (DIRECT): NEGATIVE

## 2015-12-06 MED ORDER — NYSTATIN 100000 UNIT/ML MT SUSP: 500000.0000 [IU] | Freq: Four times a day (QID) | OROMUCOSAL | Status: DC

## 2015-12-06 NOTE — ED Notes (Signed)
Patient with no complaints at this time. Respirations even and unlabored. Skin warm/dry. Discharge instructions reviewed with patient at this time. Patient given opportunity to voice concerns/ask questions. Patient discharged at this time and left Emergency Department with steady gait.   

## 2015-12-06 NOTE — Discharge Instructions (Signed)
Take the prescription as directed.  Call your regular medical doctor today to schedule a follow up appointment within the next 3 days.  Return to the Emergency Department immediately sooner if worsening.  ° °

## 2015-12-06 NOTE — ED Notes (Signed)
Patient states she is ready for discharge.

## 2015-12-06 NOTE — ED Provider Notes (Signed)
CSN: [id number]     Arrival date & time 12/06/15  [id number] History   First MD Initiated Contact with Patient 12/06/15 1105     Chief Complaint  Patient presents with  . Sore Throat      HPI Pt was seen at 1110.  Per pt, c/o gradual onset and persistence of constant sore throat, runny/stuffy nose, sinus congestion, and cough for the past 6 days. States she "has a white coating on my tongue."  Denies fevers, no rash, no CP/SOB, no N/V/D, no abd pain.     Past Medical History  Diagnosis Date  . Eczema   . Muscle spasm of back   . Shortness of breath     with exertion   . Arthritis   . Infection     r knee   Past Surgical History  Procedure Laterality Date  . Lipoma excision  11/16/2011    Procedure: EXCISION LIPOMA;  Surgeon: Scherry Ran, MD;  Location: [facility];  Service: General;  Laterality: Left;  Excision of neoplasm left shoulder  . Tubal ligation      and burned per patient.   . Multiple extractions with alveoloplasty  12/28/2011    Procedure: MULTIPLE EXTRACION WITH ALVEOLOPLASTY;  Surgeon: Gae Bon, DDS;  Location: [facility];  Service: Oral Surgery;  Laterality: Bilateral;  . Mouth surgery    . Shoulder surgery Left   . 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 Joint replacement      r knee  . Patellar tendon repair Right 05/12/2013    Procedure: PATELLA TENDON REPAIR AND ALLOGRAFT RECONSTRUCTION;  Surgeon: Carole Civil, MD;  Location: [facility];  Service: Orthopedics;  Laterality: Right;  . Total knee arthroplasty Right 05/12/2013    Procedure: POLY EXCHANGE;  Surgeon: Carole Civil, MD;  Location: [facility];  Service: Orthopedics;  Laterality: Right;  . Irrigation and debridement knee Right 05/12/2013    Procedure: IRRIGATION AND DEBRIDEMENT KNEE;  Surgeon: Carole Civil, MD;  Location: [facility];  Service: Orthopedics;   Laterality: Right;   Family History  Problem Relation Age of Onset  . Arthritis    . Anesthesia problems Neg Hx   . Hypotension Neg Hx   . Malignant hyperthermia Neg Hx   . Pseudochol deficiency Neg Hx   . Diabetes Brother    Social History  Substance Use Topics  . Smoking status: Current Some Day Smoker -- 0.10 packs/day for 1 years    Types: Cigarettes  . Smokeless tobacco: Never Used  . Alcohol Use: No   OB History    Gravida Para Term Preterm AB TAB SAB Ectopic Multiple Living   5 5 5      1 6      Review of Systems ROS: Statement: All systems negative except as marked or noted in the HPI; Constitutional: Negative for fever and chills. ; ; Eyes: Negative for eye pain, redness and discharge. ; ; ENMT: Negative for ear pain, hoarseness, +nasal congestion, sinus pressure and sore throat. ; ; Cardiovascular: Negative for chest pain, palpitations, diaphoresis, dyspnea and peripheral edema. ; ; Respiratory: +cough. Negative for wheezing and stridor. ; ; Gastrointestinal: Negative for nausea, vomiting, diarrhea, abdominal pain, blood in stool, hematemesis, jaundice and rectal bleeding. . ; ; Genitourinary: Negative for dysuria, flank pain and hematuria. ; ; Musculoskeletal: Negative for back  pain and neck pain. Negative for swelling and trauma.; ; Skin: Negative for pruritus, rash, abrasions, blisters, bruising and skin lesion.; ; Neuro: Negative for headache, lightheadedness and neck stiffness. Negative for weakness, altered level of consciousness , altered mental status, extremity weakness, paresthesias, involuntary movement, seizure and syncope.       Allergies  Chocolate  Home Medications   Prior to Admission medications   Medication Sig Start Date End Date Taking? Authorizing Provider  clobetasol cream (TEMOVATE) [id number] % Apply 1 application topically 2 (two) times daily as needed (rash).   Yes Historical Provider, MD  cyclobenzaprine (FLEXERIL) 10 MG tablet Take 10 mg by mouth 4  (four) times daily.    Yes Historical Provider, MD  ibuprofen (ADVIL,MOTRIN) 800 MG tablet Take 800 mg by mouth every 8 (eight) hours as needed.   Yes Historical Provider, MD  ketoconazole (NIZORAL) 2 % cream Apply 1 application topically daily.   Yes Historical Provider, MD  linezolid (ZYVOX) 600 MG tablet Take 600 mg by mouth 2 (two) times daily.   Yes Historical Provider, MD  megestrol (MEGACE) 40 MG tablet TAKE 1 TABLET BY MOUTH ONCE DAILY. 08/19/15  Yes Florian Buff, MD  oxyCODONE-acetaminophen (PERCOCET/ROXICET) 5-325 MG tablet Take 1 tablet by mouth every 4 (four) hours as needed. 11/04/15  Yes Carole Civil, MD  travoprost, benzalkonium, (TRAVATAN) 0.004 % ophthalmic solution Place 1 drop into both eyes at bedtime.    Yes Historical Provider, MD  zolpidem (AMBIEN) 10 MG tablet Take 10 mg by mouth at bedtime as needed for sleep.   Yes Historical Provider, MD  predniSONE (DELTASONE) 10 MG tablet Take 1 tablet (10 mg total) by mouth 3 (three) times daily with meals. Patient not taking: Reported on 12/06/2015 11/11/15   Carole Civil, MD   BP 112/68 mmHg  Pulse 106  Temp(Src) 98.4 F (36.9 C) (Oral)  Resp 18  Ht 5\' 2"  (1.575 m)  Wt 242 lb (109.77 kg)  BMI 44.25 kg/m2  SpO2 99%  LMP 01/15/2012 Physical Exam  1115: Physical examination:  Nursing notes reviewed; Vital signs and O2 SAT reviewed;  Constitutional: Well developed, Well nourished, Well hydrated, In no acute distress; Head:  Normocephalic, atraumatic; Eyes: EOMI, PERRL, No scleral icterus; ENMT: TM's clear bilat. +edemetous nasal turbinates bilat with clear rhinorrhea. Mouth and pharynx without lesions. No tonsillar exudates. No intra-oral edema. No submandibular or sublingual edema. No hoarse voice, no drooling, no stridor. No pain with manipulation of larynx. No trismus. +thrush on tongue. Mucous membranes moist; Neck: Supple, Full range of motion, No lymphadenopathy; Cardiovascular: Regular rate and rhythm, No gallop;  Respiratory: Breath sounds clear & equal bilaterally, No wheezes.  Speaking full sentences with ease, Normal respiratory effort/excursion; Chest: Nontender, Movement normal; Abdomen: Soft, Nontender, Nondistended, Normal bowel sounds; Genitourinary: No CVA tenderness; Extremities: Pulses normal, No tenderness, No edema, No calf edema or asymmetry.; Neuro: AA&Ox3, Major CN grossly intact.  Speech clear. No gross focal motor or sensory deficits in extremities.; Skin: Color normal, Warm, Dry.   ED Course  Procedures (including critical care time) Labs Review  Imaging Review  I have personally reviewed and evaluated these images and lab results as part of my medical decision-making.   EKG Interpretation None      MDM  MDM Reviewed: previous chart, nursing note and vitals Reviewed previous: labs Interpretation: labs and ultrasound      Results for orders placed or performed during the hospital encounter of 12/06/15  Rapid strep screen  Result Value Ref Range   Streptococcus, Group A Screen (Direct) NEGATIVE NEGATIVE   Dg Chest 2 View 12/06/2015  CLINICAL DATA:  Cough and chills for 6 days, occasional smoker EXAM: CHEST  2 VIEW COMPARISON:  09/12/2014 FINDINGS: Normal heart size, mediastinal contours, and pulmonary vascularity. Linear scarring in both upper lobes. Central bronchitic changes again noted. No infiltrate, pleural effusion or pneumothorax. Bones unremarkable. IMPRESSION: Bronchitic changes with BILATERAL upper lobe scarring. No acute abnormalities. Electronically Signed   By: Lavonia Dana M.D.   On: 12/06/2015 12:21    1345:  Workup reassuring. Tx thrush. Dx and testing d/w pt.  Questions answered.  Verb understanding, agreeable to d/c home with outpt f/u.    Francine Graven, DO 12/09/15 [phone]

## 2015-12-06 NOTE — ED Notes (Signed)
PT c/o sore throat, white coating on tongue, dry cough and generalized fatigue x6 days.

## 2015-12-09 ENCOUNTER — Telehealth: Payer: Self-pay | Admitting: Orthopedic Surgery

## 2015-12-09 DIAGNOSIS — Z9889 Other specified postprocedural states: Secondary | ICD-10-CM

## 2015-12-09 LAB — CULTURE, GROUP A STREP (THRC)

## 2015-12-09 NOTE — Telephone Encounter (Signed)
Routing request to Dr Luna Glasgow, Dr Aline Brochure out of the office for the week

## 2015-12-09 NOTE — Telephone Encounter (Signed)
Patient requesting refill of Oxycodone-Acetaminophen 5/325mg   Qty 180 Tablets

## 2015-12-10 MED ORDER — OXYCODONE-ACETAMINOPHEN 5-325 MG PO TABS: 1.0000 | ORAL_TABLET | ORAL | Status: DC | PRN

## 2015-12-10 NOTE — Telephone Encounter (Signed)
Rx done. 

## 2015-12-16 ENCOUNTER — Ambulatory Visit (INDEPENDENT_AMBULATORY_CARE_PROVIDER_SITE_OTHER): Payer: Medicare Other | Admitting: Orthopedic Surgery

## 2015-12-16 VITALS — BP 98/69 | HR 55 | Ht 62.0 in | Wt 242.0 lb

## 2015-12-16 DIAGNOSIS — T847XXA Infection and inflammatory reaction due to other internal orthopedic prosthetic devices, implants and grafts, initial encounter: Secondary | ICD-10-CM

## 2015-12-16 DIAGNOSIS — Z9889 Other specified postprocedural states: Secondary | ICD-10-CM | POA: Diagnosis not present

## 2015-12-16 NOTE — Progress Notes (Signed)
Patient ID: [name], female   DOB: 1961-05-29, 55 y.o.   MRN: [id number]  Chief Complaint  Patient presents with  . Follow-up    Right knee    HPI-Left knee total knee compromise patellar tendon rupture due to aggressive therapy. Treated with allograft reconstruction. Develop postop infection. Scheduled for surgery May 30. Comes in for prescription  ROS  BP 98/69 mmHg  Pulse 55  Ht 5\' 2"  (1.575 m)  Wt 242 lb (109.77 kg)  BMI 44.25 kg/m2  LMP 01/15/2012  Physical Exam Left knee no swelling no tenderness right knee swollen tender patient on antibiotics Ortho Exam    ASSESSMENT AND PLAN   Prescription refill follow-up as needed call for next prescription prior to May 30

## 2016-01-02 ENCOUNTER — Other Ambulatory Visit: Payer: Self-pay | Admitting: *Deleted

## 2016-01-02 ENCOUNTER — Telehealth: Payer: Self-pay

## 2016-01-02 DIAGNOSIS — Z9889 Other specified postprocedural states: Secondary | ICD-10-CM

## 2016-01-02 NOTE — Telephone Encounter (Signed)
Not due until next week.

## 2016-01-09 ENCOUNTER — Telehealth: Payer: Self-pay | Admitting: Orthopedic Surgery

## 2016-01-09 ENCOUNTER — Other Ambulatory Visit: Payer: Self-pay | Admitting: *Deleted

## 2016-01-09 DIAGNOSIS — Z9889 Other specified postprocedural states: Secondary | ICD-10-CM

## 2016-01-09 MED ORDER — OXYCODONE-ACETAMINOPHEN 5-325 MG PO TABS: 1.0000 | ORAL_TABLET | ORAL | Status: DC | PRN

## 2016-01-09 NOTE — Telephone Encounter (Signed)
Oxycodone-Acetaminophen 5/325mg   Qty 180 Tablets  Take 1 tablet by mouth every 4 (four) hours as needed.

## 2016-01-28 HISTORY — PX: OTHER SURGICAL HISTORY: SHX169

## 2016-02-03 ENCOUNTER — Ambulatory Visit (HOSPITAL_COMMUNITY): Payer: Medicare Other | Admitting: Physical Therapy

## 2016-02-05 ENCOUNTER — Ambulatory Visit (HOSPITAL_COMMUNITY): Payer: Medicare Other | Admitting: Physical Therapy

## 2016-02-05 ENCOUNTER — Inpatient Hospital Stay
Admission: RE | Admit: 2016-02-05 | Discharge: 2016-03-10 | Disposition: A | Discharge status: Home / Self Care | Payer: Medicare Other | Source: Ambulatory Visit | Attending: Internal Medicine | Admitting: Internal Medicine

## 2016-02-06 ENCOUNTER — Other Ambulatory Visit: Payer: Self-pay

## 2016-02-06 ENCOUNTER — Encounter: Payer: Self-pay | Admitting: Internal Medicine

## 2016-02-06 ENCOUNTER — Encounter (HOSPITAL_COMMUNITY)
Admission: RE | Admit: 2016-02-06 | Discharge: 2016-02-06 | Disposition: A | Discharge status: Home / Self Care | Payer: Medicare HMO | Source: Skilled Nursing Facility | Attending: Internal Medicine | Admitting: Internal Medicine

## 2016-02-06 ENCOUNTER — Non-Acute Institutional Stay (SKILLED_NURSING_FACILITY): Payer: Medicare HMO | Admitting: Internal Medicine

## 2016-02-06 DIAGNOSIS — F17200 Nicotine dependence, unspecified, uncomplicated: Secondary | ICD-10-CM | POA: Diagnosis not present

## 2016-02-06 DIAGNOSIS — E119 Type 2 diabetes mellitus without complications: Secondary | ICD-10-CM

## 2016-02-06 DIAGNOSIS — T847XXD Infection and inflammatory reaction due to other internal orthopedic prosthetic devices, implants and grafts, subsequent encounter: Secondary | ICD-10-CM

## 2016-02-06 LAB — BASIC METABOLIC PANEL
ANION GAP: 10 (ref 5–15)
BUN: 12 mg/dL (ref 6–20)
CO2: 22 mmol/L (ref 22–32)
Calcium: 9.1 mg/dL (ref 8.9–10.3)
Chloride: 103 mmol/L (ref 101–111)
Creatinine, Ser: 0.73 mg/dL (ref 0.44–1.00)
GFR calc Af Amer: >60 mL/min (ref >60)
GFR calc non Af Amer: >60 mL/min (ref >60)
GLUCOSE: 118 mg/dL — AB (ref 65–99)
POTASSIUM: 3.8 mmol/L (ref 3.5–5.1)
Sodium: 135 mmol/L (ref 135–145)

## 2016-02-06 LAB — CBC WITH DIFFERENTIAL/PLATELET
BASOS PCT: 0 %
Basophils Absolute: 0 10*3/uL (ref 0.0–0.1)
Eosinophils Absolute: 0.4 10*3/uL (ref 0.0–0.7)
Eosinophils Relative: 3 %
HEMATOCRIT: 29.6 % — AB (ref 36.0–46.0)
HEMOGLOBIN: 9.4 g/dL — AB (ref 12.0–15.0)
LYMPHS PCT: 24 %
Lymphs Abs: 3.3 10*3/uL (ref 0.7–4.0)
MCH: 24.8 pg — ABNORMAL LOW (ref 26.0–34.0)
MCHC: 31.8 g/dL (ref 30.0–36.0)
MCV: 78.1 fL (ref 78.0–100.0)
MONOS PCT: 11 %
Monocytes Absolute: 1.5 10*3/uL — ABNORMAL HIGH (ref 0.1–1.0)
NEUTROS ABS: 8.4 10*3/uL — AB (ref 1.7–7.7)
NEUTROS PCT: 62 %
Platelets: 653 10*3/uL — ABNORMAL HIGH (ref 150–400)
RBC: 3.79 MIL/uL — ABNORMAL LOW (ref 3.87–5.11)
RDW: 18.8 % — AB (ref 11.5–15.5)
WBC: 13.6 10*3/uL — ABNORMAL HIGH (ref 4.0–10.5)

## 2016-02-06 LAB — SEDIMENTATION RATE: Sed Rate: 96 mm/hr — ABNORMAL HIGH (ref 0–22)

## 2016-02-06 MED ORDER — OXYCODONE HCL 5 MG PO TABS
ORAL_TABLET | ORAL | Status: DC
Start: 2016-02-06 — End: 2016-02-06

## 2016-02-06 MED ORDER — ZOLPIDEM TARTRATE 10 MG PO TABS: 10.0000 mg | ORAL_TABLET | Freq: Every evening | ORAL | Status: DC | PRN

## 2016-02-06 NOTE — Progress Notes (Signed)
Facility Location: [place] Room Number: 132-P  PCP: Robert Bellow, MD [place] [id number]    This is a comprehensive admission note to [facility] personally performed by Unice Cobble MD on this date less than 30 days from date of admission. Included are preadmission medical/surgical history;reconciled medication list; family history; social history and comprehensive review of systems.  Corrections and additions to the records were documented . Comprehensive physical exam was also performed. Additionally a clinical summary was entered for each active diagnosis pertinent to this admission in the Problem List to enhance continuity of care.   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 Arva Chafe. She had undergone a right total knee arthroplasty at an outside facility with patellar tendon allograft. 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 Operative options were resection arthroplasty and potential fusion versus amputation. The surgery was performed 01/28/16 .DVT prophylaxis accomplished with Lovenox Postoperatively the patient's pain was well controlled with minimal pain medication administration. 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 PICC line was inserted 01/29/16 she was to receive oxacillin 2 g IV every 4 hours. Weekly labs were to include CBC and differential, renal function, and every Tuesday area The patient was discharged to [facility] for ongoing physical and occupational therapy. She was to be nonweightbearing on the right side. and these were to be faxed to [facility].  Past medical and surgical history: She states that she was diagnosed as having diabetes  recently. She's had no treatment except for SSI while at [facility].  Social history: Smoking and alcohol consumption history verified and data entered  Family history: History updated  Comprehensive review of systems: She describes intermittent pain in the right knee with manipulation She states diabetes was suspect due to oral thrush. She has chronic dry mouth.  Constitutional: No fever,significant weight change, fatigue  Eyes: No redness, discharge, pain, vision change ENT/mouth: No nasal congestion,  purulent discharge, earache,change in hearing ,sore throat  Cardiovascular: No chest pain, palpitations,paroxysmal nocturnal dyspnea, claudication, edema  Respiratory: No cough, sputum production,hemoptysis, DOE , significant snoring,apnea   Gastrointestinal: No heartburn,dysphagia,abdominal pain, nausea / vomiting,rectal bleeding, melena,change in bowels Genitourinary: No dysuria,hematuria, pyuria,  incontinence, nocturia Dermatologic: No rash, pruritus, change in appearance of skin Neurologic: No dizziness,headache,syncope, seizures, numbness , tingling Psychiatric: No significant anxiety , depression, insomnia, anorexia Endocrine: No change in hair/skin/ nails, excessive thirst, excessive hunger, excessive urination  Hematologic/lymphatic: No significant bruising, lymphadenopathy,abnormal bleeding Allergy/immunology: No itchy/ watery eyes, significant sneezing, urticaria, angioedema  Physical exam:  Pertinent or positive findings: Arcus senilis present. She has few upper teeth. Partial not worn. Pes planus is present. The operative site at R knee is dressed. Posterior tibial pulses slightly decreased.  General appearance:Adequately nourished; no acute distress , increased work of breathing is present.   Lymphatic: No lymphadenopathy about the head, neck, axilla . Eyes: No conjunctival inflammation or lid edema is present. There is no scleral icterus. Ears:  External ear exam  shows no significant lesions or deformities.   Nose:  External nasal examination shows no deformity or inflammation. Nasal mucosa are pink and moist without lesions ,exudates Oral exam: lips and gums are healthy appearing.There is no oropharyngeal erythema or exudate . Neck:  No thyromegaly, masses, tenderness noted.    Heart:  Normal rate and  regular rhythm. S1 and S2 normal without gallop, murmur, click, rub .  Lungs:Chest clear to auscultation without wheezes, rhonchi,rales , rubs. Abdomen:Bowel sounds are normal. Abdomen is soft and nontender with no organomegaly, hernias,masses. GU: deferred as previously addressed. Extremities:  No cyanosis, clubbing,edema  Neurologic exam : Strength good  in upper & L  lower extremities Balance,Rhomberg,finger to nose testing could not be completed due to clinical state Deep tendon reflexes are equal but 1/2+ in UE Skin: Warm & dry w/o tenting. No significant lesions or rash.  See clinical summary under each active problem in the Problem List with associated updated therapeutic plan

## 2016-02-06 NOTE — Assessment & Plan Note (Signed)
Hemoglobin A1c

## 2016-02-06 NOTE — Assessment & Plan Note (Signed)
As per discharge instructions noted above PT and OT at [facility] RLE

## 2016-02-06 NOTE — Patient Instructions (Signed)
New orders for Matrix entry. CBC and differential;BMET ;Sed Rate; and A1C in am  Repeat above labs EXCEPT for A1c  every Tues and FAX to [phone] [facility] Infectious Disease Department).

## 2016-02-07 LAB — HEMOGLOBIN A1C
HEMOGLOBIN A1C: 10.3 % — AB (ref 4.8–5.6)
MEAN PLASMA GLUCOSE: 249 mg/dL

## 2016-02-11 ENCOUNTER — Other Ambulatory Visit (HOSPITAL_COMMUNITY)
Admission: RE | Admit: 2016-02-11 | Discharge: 2016-02-11 | Disposition: A | Discharge status: Home / Self Care | Payer: Medicare Other | Source: Skilled Nursing Facility | Attending: Internal Medicine | Admitting: Internal Medicine

## 2016-02-11 ENCOUNTER — Encounter (HOSPITAL_COMMUNITY): Payer: Medicare Other | Admitting: Physical Therapy

## 2016-02-11 LAB — CBC WITH DIFFERENTIAL/PLATELET
BASOS ABS: 0.1 10*3/uL (ref 0.0–0.1)
BASOS PCT: 1 %
EOS ABS: 0.9 10*3/uL — AB (ref 0.0–0.7)
Eosinophils Relative: 6 %
HEMATOCRIT: 31.6 % — AB (ref 36.0–46.0)
Hemoglobin: 9.9 g/dL — ABNORMAL LOW (ref 12.0–15.0)
Lymphocytes Relative: 26 %
Lymphs Abs: 3.9 10*3/uL (ref 0.7–4.0)
MCH: 25.5 pg — ABNORMAL LOW (ref 26.0–34.0)
MCHC: 31.3 g/dL (ref 30.0–36.0)
MCV: 81.4 fL (ref 78.0–100.0)
MONO ABS: 1.3 10*3/uL — AB (ref 0.1–1.0)
MONOS PCT: 9 %
NEUTROS ABS: 8.7 10*3/uL — AB (ref 1.7–7.7)
Neutrophils Relative %: 59 %
PLATELETS: 685 10*3/uL — AB (ref 150–400)
RBC: 3.88 MIL/uL (ref 3.87–5.11)
RDW: 20.5 % — AB (ref 11.5–15.5)
WBC: 14.8 10*3/uL — ABNORMAL HIGH (ref 4.0–10.5)

## 2016-02-11 LAB — CREATININE, SERUM
CREATININE: 0.7 mg/dL (ref 0.44–1.00)
GFR calc Af Amer: >60 mL/min (ref >60)

## 2016-02-11 LAB — SEDIMENTATION RATE: Sed Rate: 55 mm/hr — ABNORMAL HIGH (ref 0–22)

## 2016-02-11 LAB — BUN: BUN: 12 mg/dL (ref 6–20)

## 2016-02-12 ENCOUNTER — Non-Acute Institutional Stay (SKILLED_NURSING_FACILITY): Payer: Medicare HMO | Admitting: Internal Medicine

## 2016-02-12 ENCOUNTER — Encounter: Payer: Self-pay | Admitting: Internal Medicine

## 2016-02-12 DIAGNOSIS — E119 Type 2 diabetes mellitus without complications: Secondary | ICD-10-CM | POA: Diagnosis not present

## 2016-02-12 DIAGNOSIS — T847XXD Infection and inflammatory reaction due to other internal orthopedic prosthetic devices, implants and grafts, subsequent encounter: Secondary | ICD-10-CM | POA: Diagnosis not present

## 2016-02-12 NOTE — Progress Notes (Signed)
This is a nursing facility follow up for specific acute issue of uncontrolled diabetes.  Interim medical record and care since last [place] visit was updated with review of diagnostic studies and change in clinical status since last visit were documented.  Note: only family and social history pertinent to this assessment are included. Her mother and brother have diabetes  HPI: Prior to admission to [facility] she's been told she had diabetes but was not on anyt therapy. At [facility] her highest random glucose was 139. She did receive sliding scale insulin while hospitalized.  A1c 02/06/16 revealed a value of 10.3 compatible with uncontrolled diabetes. Lantus 15 units was initiated. Fasting glucoses here at [facility] have ranged from 169 on 6/10 to 109 this morning. Renal function has been normal despite the present intravenous antibiotic protocol ordered by  Infectious Disease department. Weekly lab monitors are being faxed to them.  Comprehensive review of systems:Constitutional: No fever,significant weight change, fatigue  Eyes: No redness, discharge, pain, vision change Cardiovascular: No chest pain, palpitations,paroxysmal nocturnal dyspnea, claudication, edema  Respiratory: No cough, sputum production,hemoptysis, DOE , significant snoring,apnea   Gastrointestinal: No heartburn,dysphagia,abdominal pain, nausea / vomiting,rectal bleeding, melena,change in bowels Genitourinary: No dysuria,hematuria, pyuria,  incontinence, nocturia Musculoskeletal: No joint stiffness, joint swelling, weakness,pain Dermatologic: No rash, pruritus, change in appearance of skin Neurologic: No dizziness,headache,syncope, seizures, numbness , tingling Endocrine: No change in hair/skin/ nails, excessive thirst, excessive hunger, excessive urination  Hematologic/lymphatic: No significant bruising, lymphadenopathy,abnormal bleeding   Physical exam:  Pertinent or positive findings: She has a resting  tachycardia. She has minimal scattered rales. Pes planus is present. Posterior tibial pulses are decreased. Sensation to light touch is intact. The right lower extremity is in a soft brace. General appearance:Adequately nourished; no acute distress , increased work of breathing is present.   Lymphatic: No lymphadenopathy about the head, neck, axilla . Eyes: No conjunctival inflammation or lid edema is present. There is no scleral icterus. Ears:  External ear exam shows no significant lesions or deformities.   Nose:  External nasal examination shows no deformity or inflammation. Nasal mucosa are pink and moist without lesions ,exudates Oral exam: lips and gums are healthy appearing.There is no oropharyngeal erythema or exudate . Neck:  No thyromegaly, masses, tenderness noted.    Heart:  regular rhythm. S1 and S2 normal without gallop, murmur, click, rub .  Abdomen:Bowel sounds are normal. Abdomen is soft and nontender with no organomegaly, hernias,masses. GU: deferred . Extremities:  No cyanosis, clubbing,edema  Neurologic exam : Balance,Rhomberg,finger to nose testing could not be completed due to clinical state Skin: Warm & dry w/o tenting. No significant lesions or rash.    See summary under each active problem in the Problem List with associated updated therapeutic plan

## 2016-02-12 NOTE — Assessment & Plan Note (Signed)
Fasting blood sugars are at goal of less than 200   You should consume less than 30 grams (preferably 0)  of sugar per day from foods and drinks with high fructose corn syrup as number 1,2, 3, or #4 on the label.  Your goal is waist measurement less than 35 inches

## 2016-02-12 NOTE — Assessment & Plan Note (Addendum)
The risk of infectious processes with uncontrolled diabetes discussed Labs reviewed and faxed to [facility] ID

## 2016-02-12 NOTE — Patient Instructions (Signed)
  The following nutritional changes may help prevent Diabetes progression & complications.  White carbohydrates (potatoes, rice, bread, and pasta) cause a high spike of the sugar level which stays elevated for a significant period of time (called sugar"load").  For example a  baked potato has a cup of sugar and a  french fry  2 teaspoons of sugar.  More complex carbs such as yams, wild  rice, whole grained bread &  wheat pasta have been much lower spike and persistent load of sugar than the white carbs.  Read all labels ; avoid foods & drinks with  High Fructose Corn Syrup as #1, 2 , 3 or # 4 on label. Note : dividing the "grams of sugar" on label by 4 gives "teaspoons of sugar" content of food or drink. For example a 22 oz Coke has 68 grams of sugar or 17 tsp of sugar.

## 2016-02-13 ENCOUNTER — Other Ambulatory Visit: Payer: Self-pay

## 2016-02-13 ENCOUNTER — Encounter (HOSPITAL_COMMUNITY): Payer: Medicare Other

## 2016-02-13 MED ORDER — OXYCODONE HCL 5 MG PO TABS: ORAL_TABLET | ORAL | Status: DC

## 2016-02-13 NOTE — Telephone Encounter (Signed)
RX faxed to [facility] @ [phone]. Phone number [phone]  

## 2016-02-17 ENCOUNTER — Encounter (HOSPITAL_COMMUNITY): Payer: Medicare Other

## 2016-02-18 ENCOUNTER — Encounter (HOSPITAL_COMMUNITY)
Admission: RE | Admit: 2016-02-18 | Discharge: 2016-02-18 | Disposition: A | Discharge status: Home / Self Care | Payer: Medicare HMO | Source: Skilled Nursing Facility | Attending: Internal Medicine | Admitting: Internal Medicine

## 2016-02-18 LAB — CBC WITH DIFFERENTIAL/PLATELET
BASOS PCT: 1 %
Basophils Absolute: 0.1 10*3/uL (ref 0.0–0.1)
Eosinophils Absolute: 0.8 10*3/uL — ABNORMAL HIGH (ref 0.0–0.7)
Eosinophils Relative: 8 %
HEMATOCRIT: 34.3 % — AB (ref 36.0–46.0)
HEMOGLOBIN: 10.6 g/dL — AB (ref 12.0–15.0)
LYMPHS ABS: 2.4 10*3/uL (ref 0.7–4.0)
Lymphocytes Relative: 24 %
MCH: 25.4 pg — ABNORMAL LOW (ref 26.0–34.0)
MCHC: 30.9 g/dL (ref 30.0–36.0)
MCV: 82.3 fL (ref 78.0–100.0)
MONOS PCT: 7 %
Monocytes Absolute: 0.7 10*3/uL (ref 0.1–1.0)
NEUTROS ABS: 6.1 10*3/uL (ref 1.7–7.7)
NEUTROS PCT: 60 %
Platelets: 525 10*3/uL — ABNORMAL HIGH (ref 150–400)
RBC: 4.17 MIL/uL (ref 3.87–5.11)
RDW: 20.6 % — ABNORMAL HIGH (ref 11.5–15.5)
WBC: 10.2 10*3/uL (ref 4.0–10.5)

## 2016-02-18 LAB — BASIC METABOLIC PANEL
Anion gap: 9 (ref 5–15)
BUN: 12 mg/dL (ref 6–20)
CHLORIDE: 104 mmol/L (ref 101–111)
CO2: 23 mmol/L (ref 22–32)
CREATININE: 0.72 mg/dL (ref 0.44–1.00)
Calcium: 9.6 mg/dL (ref 8.9–10.3)
GFR calc non Af Amer: >60 mL/min (ref >60)
Glucose, Bld: 200 mg/dL — ABNORMAL HIGH (ref 65–99)
POTASSIUM: 4.2 mmol/L (ref 3.5–5.1)
Sodium: 136 mmol/L (ref 135–145)

## 2016-02-18 LAB — SEDIMENTATION RATE: Sed Rate: 55 mm/hr — ABNORMAL HIGH (ref 0–22)

## 2016-02-19 ENCOUNTER — Encounter (HOSPITAL_COMMUNITY): Payer: Medicare Other

## 2016-02-19 ENCOUNTER — Non-Acute Institutional Stay (SKILLED_NURSING_FACILITY): Payer: Medicare HMO | Admitting: Internal Medicine

## 2016-02-19 ENCOUNTER — Encounter: Payer: Self-pay | Admitting: Internal Medicine

## 2016-02-19 DIAGNOSIS — N946 Dysmenorrhea, unspecified: Secondary | ICD-10-CM | POA: Insufficient documentation

## 2016-02-19 DIAGNOSIS — E119 Type 2 diabetes mellitus without complications: Secondary | ICD-10-CM

## 2016-02-19 DIAGNOSIS — T847XXD Infection and inflammatory reaction due to other internal orthopedic prosthetic devices, implants and grafts, subsequent encounter: Secondary | ICD-10-CM | POA: Diagnosis not present

## 2016-02-19 NOTE — Assessment & Plan Note (Signed)
Sedimentation rate is elevated but stable White blood count is now normal alignment no change Labs are being faxed to [facility] ID

## 2016-02-19 NOTE — Patient Instructions (Addendum)
The potential risk of thrombotic process with Megace was discussed. The patient has no interest in discontinuing the medication because of her past history of severe dysmenorrhea. She will discuss this with her Gynecologist Glucoses are improving; no changes will be made in the basal insulin dose. She has a pending ophthalmologic exam with Dr. Gershon Crane. Annual exam recommended. She remains afebrile and white blood count is now normal. Sedimentation rate remains elevated but stable. The labs will be faxed to [facility] ID as requested

## 2016-02-19 NOTE — Assessment & Plan Note (Addendum)
The fasting blood sugars have shown a progressive decline for the most part although there is some variability. No fasting glucose over 200.  No change to her present regimen. She will continue to have annual ophthalmologic exam by Dr. Rutherford Guys

## 2016-02-19 NOTE — Assessment & Plan Note (Signed)
She will discuss the risk/benefits of continuing Megace with her Gynecologist She has no interest in discontinuing the medication at this time

## 2016-02-19 NOTE — Progress Notes (Signed)
Patient ID: [name], female   DOB: 1961-07-03, 55 y.o.   MRN: [id number]    This is a nursing facility follow up her diabetic status, anemia, and thrombotic risk.  Interim medical record and care since last [place] visit was updated with review of diagnostic studies and change in clinical status since last visit were documented.  HPI: Since 02/08/16 glucoses have ranged from a low of 103 on 6/22 to a high of 184 on 6/18. There has been variability in the morning sugars but none have been >200 and the general trend is one of progressive lowering. She is on basal insulin 15 units of Lantus daily.  Her serial hematocrits have shown progressive improvement. As of today the hematocrit is 34.3.  Her sedimentation rate is elevated but stable at 55.  She remains on Megace; she has completed the Lovenox prophylaxis.  Comprehensive review of systems:Constitutional: No fever,significant weight change, fatigue  Eyes: No redness, discharge, pain, vision change ENT/mouth: No nasal congestion,  purulent discharge, earache,change in hearing ,sore throat  Cardiovascular: No chest pain, palpitations,paroxysmal nocturnal dyspnea, claudication, edema  Respiratory: No cough, sputum production,hemoptysis, DOE , significant snoring,apnea  Gastrointestinal: No heartburn,dysphagia,abdominal pain, nausea / vomiting,rectal bleeding, melena,change in bowels Genitourinary: No dysuria,hematuria, pyuria,  incontinence, nocturia Musculoskeletal: No joint stiffness, joint swelling, weakness,pain Dermatologic: No rash, pruritus, change in appearance of skin Neurologic: No dizziness,headache,syncope, seizures, numbness , tingling Psychiatric: No significant anxiety , depression, insomnia, anorexia Endocrine: No change in hair/skin/ nails, excessive thirst, excessive hunger, excessive urination  Hematologic/lymphatic: No significant bruising, lymphadenopathy,abnormal bleeding Allergy/immunology: No  itchy/ watery eyes, significant sneezing, urticaria, angioedema  Physical exam:  Pertinent or positive findings: Pulse recheck 89. RLE in soft brace. Decreased PTP. General appearance:Adequately nourished; no acute distress , increased work of breathing is present.   Lymphatic: No lymphadenopathy about the head, neck, axilla . Eyes: No conjunctival inflammation or lid edema is present. There is no scleral icterus. Ears:  External ear exam shows no significant lesions or deformities.   Nose:  External nasal examination shows no deformity or inflammation. Nasal mucosa are pink and moist without lesions ,exudates Oral exam: lips and gums are healthy appearing.There is no oropharyngeal erythema or exudate . Neck:  No thyromegaly, masses, tenderness noted.    Heart:  Normal rate and regular rhythm. S1 and S2 normal without gallop, murmur, click, rub .  Lungs:Chest clear to auscultation without wheezes, rhonchi,rales , rubs. No rales present as previously noted Abdomen:Bowel sounds are normal. Abdomen is soft and nontender with no organomegaly, hernias,masses. GU: deferred as previously addressed. Extremities:  No cyanosis, clubbing,edema  Neurologic exam : Strength equal  in upper extremities Balance,Rhomberg,finger to nose testing could not be completed due to clinical state Deep tendon reflexes are equal in UEs Skin: Warm & dry w/o tenting. No significant lesions or rash.    See active diagnoses with associated updated therapeutic plan in Patient  Instruction segment

## 2016-02-20 ENCOUNTER — Other Ambulatory Visit: Payer: Self-pay | Admitting: *Deleted

## 2016-02-20 LAB — FANA STAINING PATTERNS

## 2016-02-20 LAB — ANTINUCLEAR ANTIBODIES, IFA: ANTINUCLEAR ANTIBODIES, IFA: POSITIVE — AB

## 2016-02-20 MED ORDER — OXYCODONE HCL 5 MG PO TABS: ORAL_TABLET | ORAL | Status: DC

## 2016-02-20 NOTE — Telephone Encounter (Signed)
[facility] 

## 2016-02-24 ENCOUNTER — Encounter (HOSPITAL_COMMUNITY): Payer: Medicare Other | Admitting: Physical Therapy

## 2016-02-25 ENCOUNTER — Other Ambulatory Visit (HOSPITAL_COMMUNITY)
Admission: RE | Admit: 2016-02-25 | Discharge: 2016-02-25 | Disposition: A | Discharge status: Home / Self Care | Payer: Medicare HMO | Source: Other Acute Inpatient Hospital | Attending: Internal Medicine | Admitting: Internal Medicine

## 2016-02-25 DIAGNOSIS — R531 Weakness: Secondary | ICD-10-CM | POA: Diagnosis present

## 2016-02-25 LAB — CBC WITH DIFFERENTIAL/PLATELET
BASOS ABS: 0.1 10*3/uL (ref 0.0–0.1)
BASOS PCT: 1 %
EOS ABS: 0.9 10*3/uL — AB (ref 0.0–0.7)
Eosinophils Relative: 8 %
HEMATOCRIT: 35.9 % — AB (ref 36.0–46.0)
Hemoglobin: 11.4 g/dL — ABNORMAL LOW (ref 12.0–15.0)
Lymphocytes Relative: 17 %
Lymphs Abs: 2 10*3/uL (ref 0.7–4.0)
MCH: 26 pg (ref 26.0–34.0)
MCHC: 31.8 g/dL (ref 30.0–36.0)
MCV: 82 fL (ref 78.0–100.0)
MONO ABS: 1.1 10*3/uL — AB (ref 0.1–1.0)
MONOS PCT: 9 %
NEUTROS ABS: 7.7 10*3/uL (ref 1.7–7.7)
NEUTROS PCT: 65 %
PLATELETS: 370 10*3/uL (ref 150–400)
RBC: 4.38 MIL/uL (ref 3.87–5.11)
RDW: 19.2 % — AB (ref 11.5–15.5)
WBC: 11.8 10*3/uL — ABNORMAL HIGH (ref 4.0–10.5)

## 2016-02-25 LAB — SEDIMENTATION RATE: SED RATE: 32 mm/h — AB (ref 0–22)

## 2016-02-25 LAB — BASIC METABOLIC PANEL
ANION GAP: 6 (ref 5–15)
BUN: 14 mg/dL (ref 6–20)
CALCIUM: 9.5 mg/dL (ref 8.9–10.3)
CO2: 21 mmol/L — AB (ref 22–32)
CREATININE: 0.63 mg/dL (ref 0.44–1.00)
Chloride: 106 mmol/L (ref 101–111)
Glucose, Bld: 142 mg/dL — ABNORMAL HIGH (ref 65–99)
Potassium: 4.1 mmol/L (ref 3.5–5.1)
SODIUM: 133 mmol/L — AB (ref 135–145)

## 2016-02-26 ENCOUNTER — Encounter (HOSPITAL_COMMUNITY)
Admission: RE | Admit: 2016-02-26 | Discharge: 2016-02-26 | Disposition: A | Discharge status: Home / Self Care | Payer: Medicare HMO | Source: Skilled Nursing Facility | Attending: Internal Medicine | Admitting: Internal Medicine

## 2016-02-26 LAB — CBC WITH DIFFERENTIAL/PLATELET
BASOS ABS: 0.1 10*3/uL (ref 0.0–0.1)
Basophils Relative: 1 %
Eosinophils Absolute: 0.8 10*3/uL — ABNORMAL HIGH (ref 0.0–0.7)
Eosinophils Relative: 8 %
HEMATOCRIT: 36.5 % (ref 36.0–46.0)
HEMOGLOBIN: 11.5 g/dL — AB (ref 12.0–15.0)
LYMPHS PCT: 24 %
Lymphs Abs: 2.4 10*3/uL (ref 0.7–4.0)
MCH: 26.1 pg (ref 26.0–34.0)
MCHC: 31.5 g/dL (ref 30.0–36.0)
MCV: 83 fL (ref 78.0–100.0)
MONO ABS: 0.9 10*3/uL (ref 0.1–1.0)
Monocytes Relative: 9 %
NEUTROS ABS: 6.1 10*3/uL (ref 1.7–7.7)
NEUTROS PCT: 58 %
Platelets: 393 10*3/uL (ref 150–400)
RBC: 4.4 MIL/uL (ref 3.87–5.11)
RDW: 19.2 % — ABNORMAL HIGH (ref 11.5–15.5)
WBC: 10.3 10*3/uL (ref 4.0–10.5)

## 2016-02-26 LAB — ANTINUCLEAR ANTIBODIES, IFA: ANTINUCLEAR ANTIBODIES, IFA: POSITIVE — AB

## 2016-02-26 LAB — CREATININE, SERUM: Creatinine, Ser: 0.64 mg/dL (ref 0.44–1.00)

## 2016-02-26 LAB — BUN: BUN: 12 mg/dL (ref 6–20)

## 2016-02-26 LAB — FANA STAINING PATTERNS

## 2016-02-26 LAB — SEDIMENTATION RATE: Sed Rate: 36 mm/hr — ABNORMAL HIGH (ref 0–22)

## 2016-02-27 ENCOUNTER — Encounter: Payer: Self-pay | Admitting: Internal Medicine

## 2016-02-27 ENCOUNTER — Non-Acute Institutional Stay (SKILLED_NURSING_FACILITY): Payer: Medicare HMO | Admitting: Internal Medicine

## 2016-02-27 ENCOUNTER — Encounter (HOSPITAL_COMMUNITY): Payer: Medicare Other | Admitting: Physical Therapy

## 2016-02-27 ENCOUNTER — Other Ambulatory Visit: Payer: Self-pay | Admitting: Internal Medicine

## 2016-02-27 DIAGNOSIS — T847XXD Infection and inflammatory reaction due to other internal orthopedic prosthetic devices, implants and grafts, subsequent encounter: Secondary | ICD-10-CM

## 2016-02-27 DIAGNOSIS — R768 Other specified abnormal immunological findings in serum: Secondary | ICD-10-CM | POA: Diagnosis not present

## 2016-02-27 DIAGNOSIS — L309 Dermatitis, unspecified: Secondary | ICD-10-CM | POA: Diagnosis not present

## 2016-02-27 NOTE — Progress Notes (Signed)
    Facility Location: [place] Room Number: [id number]   This is a nursing facility follow up for specific acute issue of abnormal lab test. Interim medical record and care since last [place] visit was updated with review of diagnostic studies and change in clinical status since last visit were documented.  HPI: The patient is receiving oxacillin IVy every hours for a right knee staph infection. The sedimentation rate is being monitored weekly with other labs. Inadvertently a staff member checked order for a sedimentation rate which was listed in combination with ANA. The ANA came back positive. The most recent value revealed a value of 1:160 in a homogenous pattern.  She has a history of eczema and sees Dr. Tarri Glenn, dermatologist. This is well controlled with topical agents. Intermittently she's had the eczema over her face but no definite butterfly malar pattern.  She does have pain in the right knee postoperatively. She also has intermittent pain and swelling in the left knee. She denies arthralgias with swelling, redness in other joints.  Diabetic control has continued to improve. Fasting blood sugars over the last 2 weeks have ranged 107-184 despite A1c of 10.3% on 02/06/16. She has a ophthalmologic follow-up with Dr. Gershon Crane in the near future.   Physical exam:  Pertinent or positive findings:A few remaining upper teeth. Slight tachycardia present. Decreased pedal pulses.  Soft brace right lower extremity.  General appearance:Adequately nourished; no acute distress , increased work of breathing is present.   Lymphatic: No lymphadenopathy about the head, neck, axilla . Eyes: No conjunctival inflammation or lid edema is present. There is no scleral icterus. Oral exam: lips and gums are healthy appearing.There is no oropharyngeal erythema or exudate . Neck:  No thyromegaly, masses, tenderness noted.    Heart:  regular rhythm. S1 and S2 normal without gallop, murmur,  click, rub .  Lungs:Chest clear to auscultation without wheezes, rhonchi,rales , rubs. Abdomen:Bowel sounds are normal. Abdomen is soft and nontender with no organomegaly, hernias,masses. GU: deferred. Extremities:  No cyanosis, clubbing,edema  Neurologic exam : Strength equal  in upper extremities Balance,Rhomberg,finger to nose testing could not be completed due to clinical state Skin: Warm & dry w/o tenting. NO significant lesions or rash.    See summary under each active problem in the Problem List with associated updated therapeutic plan

## 2016-02-27 NOTE — Patient Instructions (Addendum)
ANA should be repeated after the right knee infection is completely resolved. If the ANA remains positive; Dr Karie Kirks may recommend Rheumatology consultation. At this time there is no indication that the patient has active lupus.

## 2016-02-27 NOTE — Assessment & Plan Note (Signed)
Continue IV oxacillin as per [facility] ID

## 2016-02-27 NOTE — Assessment & Plan Note (Signed)
Rheumatology consult if positive ANA persists after intra-articular joint infection resolved

## 2016-03-03 ENCOUNTER — Encounter (HOSPITAL_COMMUNITY)
Admission: RE | Admit: 2016-03-03 | Discharge: 2016-03-03 | Disposition: A | Discharge status: Home / Self Care | Payer: Medicare HMO | Source: Skilled Nursing Facility | Attending: Internal Medicine | Admitting: Internal Medicine

## 2016-03-03 ENCOUNTER — Non-Acute Institutional Stay (SKILLED_NURSING_FACILITY): Payer: Medicare HMO | Admitting: Internal Medicine

## 2016-03-03 ENCOUNTER — Encounter: Payer: Self-pay | Admitting: Internal Medicine

## 2016-03-03 DIAGNOSIS — R21 Rash and other nonspecific skin eruption: Secondary | ICD-10-CM | POA: Insufficient documentation

## 2016-03-03 DIAGNOSIS — T847XXD Infection and inflammatory reaction due to other internal orthopedic prosthetic devices, implants and grafts, subsequent encounter: Secondary | ICD-10-CM

## 2016-03-03 LAB — CBC WITH DIFFERENTIAL/PLATELET
Basophils Absolute: 0 10*3/uL (ref 0.0–0.1)
Basophils Relative: 0 %
EOS ABS: 0.2 10*3/uL (ref 0.0–0.7)
Eosinophils Relative: 2 %
HEMATOCRIT: 38.2 % (ref 36.0–46.0)
HEMOGLOBIN: 12.5 g/dL (ref 12.0–15.0)
LYMPHS ABS: 2 10*3/uL (ref 0.7–4.0)
LYMPHS PCT: 17 %
MCH: 26.9 pg (ref 26.0–34.0)
MCHC: 32.7 g/dL (ref 30.0–36.0)
MCV: 82.2 fL (ref 78.0–100.0)
MONOS PCT: 7 %
Monocytes Absolute: 0.8 10*3/uL (ref 0.1–1.0)
NEUTROS ABS: 8.7 10*3/uL — AB (ref 1.7–7.7)
NEUTROS PCT: 74 %
Platelets: 354 10*3/uL (ref 150–400)
RBC: 4.65 MIL/uL (ref 3.87–5.11)
RDW: 17.9 % — ABNORMAL HIGH (ref 11.5–15.5)
WBC: 11.8 10*3/uL — AB (ref 4.0–10.5)

## 2016-03-03 LAB — CREATININE, SERUM
CREATININE: 0.64 mg/dL (ref 0.44–1.00)
GFR calc Af Amer: >60 mL/min (ref >60)
GFR calc non Af Amer: >60 mL/min (ref >60)

## 2016-03-03 LAB — SEDIMENTATION RATE: Sed Rate: 41 mm/hr — ABNORMAL HIGH (ref 0–22)

## 2016-03-03 LAB — BUN: BUN: 13 mg/dL (ref 6–20)

## 2016-03-03 NOTE — Progress Notes (Signed)
Patient ID: [name], female   DOB: 1960-10-22, 55 y.o.   MRN: [id number]   This is an acute visit.  Level care skilled.  Facility [facility].  Chief complaint-acute visit secondary to rash.  History of present illness.  Patient is a pleasant 55 year old female who is here receiving IV oxacillin for a right knee staph infection.  This is followed closely at [facility].  With weekly labs.  She also has a history of eczema and sees Dr. Tarri Glenn dermatologist.  Apparently she is has been well controlled with topical agents.  Apparently starting yesterday she developed a rash apparently more so in her inner thighs and right-sided thorax area-apparently she received Benadryl with some relief.  She feels the rash has progressed somewhat today.  She reports she did use a different body wash it appears yesterday she said she quickly washed it off.    She does not complain of any swelling or shortness of breath chest tightness any sign of an anaphylactic reaction.  Vital signs continued to be stable.  Previous medical history noted of note she is apparently recently diagnosed diabetic.  Also history of eczema as noted above.  Medications.  Ambien 10 mg daily at bedtime when necessary.  Aspirin 325 mg twice a day until July 11 at that time will be reduced to once a day.  Benadryl 25 mg when necessary itching once a day.  Cyclobenzaprine 10 mg every 8 hours when necessary.  Ferrous sulfate 325 mg twice a day.  Lantus 15 units daily at bedtime.  Megace 40 mg daily.  Oxacillin 2 g IV every 4 hours until July 11.  Oxycodone 10 mg every 4 hours when necessary-15 mg every 4 hours when necessary severe pain.  Senokot-S 2 tabs twice a day.  Travatan Z 0.004% 1 drop both eyes daily at bedtime.  Tylenol 650 mg every 4 hours when necessary.  Review of systems.  General does not complain of any fever chills.  Skin is complaining of  itching #rash as noted above she does have a history of eczema.  ResP-- no complaints of shortness breath or cough.  Cardiac no chest pain.  GI is not complaining of a nausea vomiting diarrhea constipation.  hEAD ears eyes nose mouth and throat does not complaining of any tongue swelling visual changes or sore throat.  Musculoskeletal-is not complaining of joint pain currently is being treated again for a right knee infection currently has a soft brace on.  Neurologic care is not complaining of dizziness headache or numbness.  Psych does not complaining of anxiety or depression.  Physical exam.  Dr. 98.4 pulse 98 respirations 20 blood pressure 118/80.  In general this is a pleasant middle-aged female in no distress.  Her skin is warm and dry she does have  blotchy macular papular type rash on her inner thighs bilaterally as well as right thorax extending to her lateral breast area.    She is also appearing to have some on her buttocks area.  Her oropharynx clear mucous membranes moist tongue is midline no evidence of edema of the lips or tongue.  Heart is regular rate and rhythm without murmur gallop or rub  Her chest is clear to auscultation there is no labored breathing.  Abdomens is obese soft nontender positive bowel sounds.  Muscle skeletal does have soft brace in place right leg-do not see significant edema-otherwise able to move her other extremities appears at baseline currently and wheelchair.  Neurologic no lateralizing findings her speech is clear cranial nerves grossly intact.  Psych she is alert and oriented pleasant and appropriate labs.  03/03/2016.  Creatinine 0.64-BUN 13.  WBC 11.8 hemoglobin 12.5 platelets 354.  #1-dermatitis unspecified-possibly this may be a contact dermatitis from patient's history-she does have a history of eczema as well.  There is no sign of an anaphylactic reaction here.  She has received Benadryl apparently with some  relief she has a standing order for this once a day---we will changes to 3 times a day when necessary for the next 7 days to see if this will give some additional relief-also will start triamcinolone cream 0.1% twice a day .  Monitor for any changes sign of anaphylactic reaction--SOB or swelling-- although she shows no indication of this currently.  #2-history of infected orthopedic implant right knee-white count of 11.8 actually shows improvement from previous level she is afebrile this appears to be stable she is getting regular labswith results sent to [facility] for follow-up-she continues her antibiotic until July 11.  [id number] note greater than 25 minutes spent assessing patient reviewing her chart discussing her status with nursing staff and formulating a plan of care          .

## 2016-03-04 ENCOUNTER — Non-Acute Institutional Stay (SKILLED_NURSING_FACILITY): Payer: Medicare HMO | Admitting: Internal Medicine

## 2016-03-04 ENCOUNTER — Encounter: Payer: Self-pay | Admitting: Internal Medicine

## 2016-03-04 DIAGNOSIS — L500 Allergic urticaria: Secondary | ICD-10-CM

## 2016-03-04 NOTE — Patient Instructions (Addendum)
Urticaria or hives represent an allergic reaction to foods or medications. At the top the list of causes are strawberries, tomatoes, chocolate, shellfish and nuts. You should avoid these as much is possible if having rash. It may also occur with medications which you have taken for years. Take medications as prescribed. Use hypoallergenic soaps and detergents.

## 2016-03-04 NOTE — Progress Notes (Signed)
   This is a nursing facility follow up for specific acute issue of facial swelling and truncal rash.  Interim medical record and care since last [place] visit was updated with review of diagnostic studies and change in clinical status since last visit were documented.   HPI: The patient was seen 7/4 by the PA for rash which began 7/3 on her thighs and  thorax. This was associated with swelling of the hands. Oral Benadryl was of some benefit for itching. She had employed a new body wash prior to onset of symptoms. Today she has noted some facial tightness and some swelling of her chin and lips. She denies any swelling of the tongue or associated respiratory symptoms. She has a history of eczema for which she sees Dr Tarri Glenn, Dermatology.  Labs are requested weekly on Tuesday  by [facility] because of parenteral  antibiotic therapy  for infected orthopedic implant .CBC 03/03/16   revealed a spike in her white count to 11,800. There was a minimal left shift with 8.7 absolute neutrophils. Anemia had resolved. Sedimentation rate was minimally elevated at 41. Renal function was normal.   ROS:No associated itchy, watery eyes. Shortness of breath, wheezing, or cough absent. Fever ,chills , or sweats denied. Purulence absent. Diarrhea not present.  Physical exam:  Pertinent or positive findings: she does appear to have some subtle facial swelling. The oropharynx is patent.  She is not wearing the upper dental plate. There is no stridor over the neck with hyperventilation. She exhibits an S4 tachycardia. She has scattered patches of urticaria over the trunk and upper extremities.  General appearance:Adequately nourished; no acute distress , increased work of breathing is present.   Lymphatic: No lymphadenopathy about the head, neck, axilla . Eyes: No conjunctival inflammation or lid edema is present. There is no scleral icterus. Ears:  External ear exam shows no significant lesions or  deformities.   Nose:  External nasal examination shows no deformity or inflammation. Nasal mucosa are pink and moist without lesions ,exudates Oral exam: lips and gums are healthy appearing.There is no oropharyngeal erythema or exudate . Neck:  No thyromegaly, masses, tenderness noted.    Heart:  No murmur, click, rub .  Lungs:Chest clear to auscultation without wheezes, rhonchi,rales , rubs. Abdomen:Bowel sounds are normal. Abdomen is soft and nontender with no organomegaly, hernias,masses. Extremities:  No cyanosis, clubbing,edema  Skin: Warm & dry w/o tenting.  #1 urticaria with subjective facial and lip swelling. No airway compromise clinically. Plan: As discussed with the patient, nonessential medications will be discontinued. [facility] will be notified of this new development because of the oxacillin therapy.

## 2016-03-04 NOTE — Assessment & Plan Note (Signed)
See order(s).

## 2016-03-10 ENCOUNTER — Encounter (HOSPITAL_COMMUNITY)
Admission: RE | Admit: 2016-03-10 | Discharge: 2016-03-10 | Disposition: A | Discharge status: Home / Self Care | Payer: Medicare HMO | Source: Skilled Nursing Facility | Attending: *Deleted | Admitting: *Deleted

## 2016-03-10 ENCOUNTER — Non-Acute Institutional Stay (SKILLED_NURSING_FACILITY): Payer: Medicare HMO | Admitting: Internal Medicine

## 2016-03-10 ENCOUNTER — Encounter: Payer: Self-pay | Admitting: Internal Medicine

## 2016-03-10 DIAGNOSIS — L500 Allergic urticaria: Secondary | ICD-10-CM

## 2016-03-10 DIAGNOSIS — E1165 Type 2 diabetes mellitus with hyperglycemia: Secondary | ICD-10-CM

## 2016-03-10 DIAGNOSIS — T847XXD Infection and inflammatory reaction due to other internal orthopedic prosthetic devices, implants and grafts, subsequent encounter: Secondary | ICD-10-CM

## 2016-03-10 DIAGNOSIS — Z794 Long term (current) use of insulin: Secondary | ICD-10-CM

## 2016-03-10 DIAGNOSIS — D509 Iron deficiency anemia, unspecified: Secondary | ICD-10-CM | POA: Diagnosis not present

## 2016-03-10 LAB — CBC WITH DIFFERENTIAL/PLATELET
BASOS ABS: 0.1 10*3/uL (ref 0.0–0.1)
Basophils Relative: 1 %
Eosinophils Absolute: 1.2 10*3/uL — ABNORMAL HIGH (ref 0.0–0.7)
Eosinophils Relative: 9 %
HEMATOCRIT: 36.3 % (ref 36.0–46.0)
HEMOGLOBIN: 11.7 g/dL — AB (ref 12.0–15.0)
LYMPHS ABS: 2.7 10*3/uL (ref 0.7–4.0)
LYMPHS PCT: 19 %
MCH: 26.4 pg (ref 26.0–34.0)
MCHC: 32.2 g/dL (ref 30.0–36.0)
MCV: 81.8 fL (ref 78.0–100.0)
Monocytes Absolute: 1.3 10*3/uL — ABNORMAL HIGH (ref 0.1–1.0)
Monocytes Relative: 9 %
NEUTROS ABS: 8.9 10*3/uL — AB (ref 1.7–7.7)
Neutrophils Relative %: 63 %
Platelets: 374 10*3/uL (ref 150–400)
RBC: 4.44 MIL/uL (ref 3.87–5.11)
RDW: 17.3 % — ABNORMAL HIGH (ref 11.5–15.5)
WBC: 14.3 10*3/uL — AB (ref 4.0–10.5)

## 2016-03-10 LAB — BASIC METABOLIC PANEL
ANION GAP: 4 — AB (ref 5–15)
BUN: 13 mg/dL (ref 6–20)
CHLORIDE: 104 mmol/L (ref 101–111)
CO2: 26 mmol/L (ref 22–32)
Calcium: 8.6 mg/dL — ABNORMAL LOW (ref 8.9–10.3)
Creatinine, Ser: 0.6 mg/dL (ref 0.44–1.00)
GFR calc Af Amer: >60 mL/min (ref >60)
GLUCOSE: 161 mg/dL — AB (ref 65–99)
POTASSIUM: 3.8 mmol/L (ref 3.5–5.1)
Sodium: 134 mmol/L — ABNORMAL LOW (ref 135–145)

## 2016-03-10 NOTE — Progress Notes (Signed)
Location:   [place] Room Number: 132/P Place of Service:  SNF [phone]) Provider:  Mayo Ao, MD  Patient Care Team: Lemmie Evens, MD as PCP - General [facility])  Extended Emergency Contact Information Primary Emergency Contact: Espinoza,Catherine Address: [address]          [place], [place] [address] Johnnette Litter of [place] Phone: [phone] Relation: Mother Secondary Emergency Contact: Ivy Lynn, [place] [address] Johnnette Litter of [place] Phone: [phone] Mobile Phone: [phone] Relation: Friend  Code Status:  Full Code Goals of care: Advanced Directive information Advanced Directives 03/10/2016  Does patient have an advance directive? Yes  Type of Advance Directive (No Data)  Does patient want to make changes to advanced directive? No - Patient declined  Copy of advanced directive(s) in chart? Yes     Chief Complaint  Patient presents with  . Discharge Note    HPI:  Pt is a 55 y.o. female seen today for Discharge from facility.  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  She was hospitalized at [facility] from May 30 to 02/05/2016 for resection arthroplasty of the right total knee with antibiotic spacer placement.  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  Close-up patient did relatively well cultures showed methicillin sensitive staph  lugdunenisis---PICC line was inserted she was to receive oxacillin IV she will complete today   Her stay here has Been relatively unremarkable she did have a recent rash inner thigh area and also some swelling of her hands face and lips-this was assessed by me as well as Dr. 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  She also is a type II diabetic hemoglobin A1c was 10.3   prior to admission she is currently on Lantus 15 units daily at bedtime and this appears to be doing significantly better as well CBGs in the morning appear to be largely in the lower 100s with occasional late readings above 200s but these are pretty rare this will warrant follow up by primary care provider.  Patient did have a mildly elevated white count last week at just over 11,000 updated white count today is 14,300 granulocyte absolute elevated at 8.9 this is comparable to what the lab told this last week.  She is not complaining of any fever or chills no dysuria does not complain of any chest congestion or increased cough-appears to be stable in this regards   Past Medical History  Diagnosis Date  . Eczema   . Muscle spasm of back   . DOE (dyspnea on exertion)   . Arthritis   . Infection     r knee  . Glaucoma   . Positive ANA (antinuclear antibody)     1:180 homogenous pattern   Past Surgical History  Procedure Laterality Date  . Lipoma excision  11/16/2011    Procedure: EXCISION LIPOMA;  Surgeon: Scherry Ran, MD;  Location: [facility];  Service: General;  Laterality: Left;  Excision of neoplasm left shoulder  .  Tubal ligation      and burned per patient.   . Multiple extractions with alveoloplasty  12/28/2011    Procedure: MULTIPLE EXTRACION WITH ALVEOLOPLASTY;  Surgeon: Gae Bon, DDS;  Location: [place];  Service: Oral Surgery;  Laterality: Bilateral;  . Mouth surgery    . Shoulder surgery Left   . 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  Joint replacement      r knee  . Patellar tendon repair Right 05/12/2013    Procedure: PATELLA TENDON REPAIR AND ALLOGRAFT RECONSTRUCTION;  Surgeon: Carole Civil, MD;  Location: [facility];  Service: Orthopedics;  Laterality: Right;  . Total knee arthroplasty Right 05/12/2013    Procedure: POLY EXCHANGE;  Surgeon: Carole Civil, MD;  Location: [facility];  Service: Orthopedics;  Laterality: Right;  . Irrigation and debridement knee Right 05/12/2013    Procedure: IRRIGATION AND DEBRIDEMENT KNEE;  Surgeon: Carole Civil, MD;  Location: [facility];  Service: Orthopedics;  Laterality: Right;  . Resection arthroplasty right total knee  01/28/16    Dr Starlyn Skeans, [facility]; antibiotic spacer placed    Allergies  Allergen Reactions  . Chocolate Hives  . Nystatin Anxiety    . Current Outpatient Prescriptions on File Prior to Visit  Medication Sig Dispense Refill  . acetaminophen (TYLENOL) 325 MG tablet Take 650 mg by mouth every 4 (four) hours as needed.    . cetirizine (ZYRTEC) 10 MG tablet Take 1 tablet (10 mg total) by mouth daily. 30 tablet 11  . ferrous sulfate 325 (65 FE) MG tablet Take 325 mg by mouth 2 (two) times daily.    . insulin glargine (LANTUS) 100 UNIT/ML injection Inject 15 Units into the skin at bedtime.    . megestrol (MEGACE) 40 MG tablet TAKE 1 TABLET BY MOUTH ONCE DAILY. 30 tablet 11  . NON FORMULARY May apply ice to incision for pain and swelling relief. Ice to be on for 20 minutes then off for 20 minutes. Do not let ice come in direct contact with skin, place in towel and then apply to skin. ( AS NEEDED )    . oxacillin 2 g injection 2 grams;intravenous Special Instructions: Mix with sodium chloride 0.9% 100 ml every 4 hours (START DATE 02/07/2016 ( END DATE 03/10/2016)    . oxyCODONE (ROXICODONE) 15 MG immediate release tablet Take 15 mg by mouth every 4 (four) hours as needed for pain (Severe).     . Oxycodone HCl 10 MG TABS Take 10 mg by mouth every 4 (four) hours as  needed (Moderate).     . sennosides-docusate sodium (SENOKOT-S) 8.6-50 MG tablet Take 2 tablets by mouth 2 (two) times daily.    . travoprost, benzalkonium, (TRAVATAN) 0.004 % ophthalmic solution Place 1 drop into both eyes at bedtime.      No current facility-administered medications on file prior to visit.  In general does not complain of any fever or chills.  Skin the rash is on her thighs as well as the swelling of the lips and face and hands appear to be largely resolved today.  Head ears eyes nose mouth and throat is not complaining of sore throat or visual changes.  Respiratory denies any cough or shortness of breath or chest congestion.  Cardiac denies chest pain has minimal lower extremity edema.  GI is not complaining of any nausea  vomiting diarrhea constipation or abdominal discomfort.  GU denies dysuria.  Musculoskeletal at times continues to have right leg discomfort oxycodone does help with this otherwise no complaints.  Neurologic is not complaining dizziness headache or syncope.  Psych-is not complaining of any anxiety or depression continues to be alert and conversant interactive  Review of Systems  \As noted above  Immunization History  Administered Date(s) Administered  . PPD Test 02/20/2016   Pertinent  Health Maintenance Due  Topic Date Due  . FOOT EXAM  02/11/2017 (Originally 11/21/1970)  . OPHTHALMOLOGY EXAM  02/11/2017 (Originally 11/21/1970)  . URINE MICROALBUMIN  02/11/2017 (Originally 11/21/1970)  . COLONOSCOPY  02/19/2022 (Originally 11/21/2011)  . INFLUENZA VACCINE  03/31/2016  . HEMOGLOBIN A1C  08/07/2016  . MAMMOGRAM  11/24/2017  . PAP SMEAR  10/16/2018   No flowsheet data found. Functional Status Survey:    Filed Vitals:   03/10/16 1045  BP: 124/82  Pulse: 97  Temp: 97.8 F (36.6 C)  TempSrc: Oral  Resp: 20  Height: 5\' 2"  (1.575 m)  Weight: 234 lb 3.2 oz (106.232 kg)   Body mass index is 42.82 kg/(m^2). Physical Exam   In  general this is a pleasant middle-aged female in no distress sitting comfortably in her wheelchair.  Her skin is warm and dry.  Appears to have a resolving rash in inner thighs bilaterally again edema of her hands and face appears to be largely resolved.  Oropharynx clear mucous membranes moist I do not see any tongue swelling.  Eyes pupils appear reactive to light visual acuity appears grossly intact.  Chest is clear to auscultation there is no labored breathing.  Heart is regular rate and rhythm without murmur gallop or rub she has minimal lower extremity edema pedal pulses are intact bilaterally slightly reduced she does have compression hose on bilaterally.  Abdomen is somewhat obese soft nontender positive bowel sounds.  Musculoskeletal is able to move her upper extremities with baseline strength-this is the case with her left lower extremity as well-she does have her right lower extremity in a soft brace-surgical site currently covered there is some post op edema that persists light warm but I do not see increased erythema or visual signs of increasing infection.  Neurologic is grossly intact her speech is clear no lateralizing findings.  Psych she continues to be pleasant and appropriate alert and oriented  Labs reviewed:  Recent Labs  02/18/16 0730 02/25/16 1234 02/26/16 0745 03/03/16 0930 03/10/16 1000  NA 136 133*  --   --  134*  K 4.2 4.1  --   --  3.8  CL 104 106  --   --  104  CO2 23 21*  --   --  26  GLUCOSE 200* 142*  --   --  161*  BUN 12 14 12 13 13   CREATININE 0.72 0.63 0.64 0.64 0.60  CALCIUM 9.6 9.5  --   --  8.6*   No results for input(s): AST, ALT, ALKPHOS, BILITOT, PROT, ALBUMIN in the last 8760 hours.  Recent Labs  02/26/16 0745 03/03/16 0930 03/10/16 1000  WBC 10.3 11.8* 14.3*  NEUTROABS 6.1 8.7* 8.9*  HGB 11.5* 12.5 11.7*  HCT 36.5 38.2 36.3  MCV 83.0 82.2 81.8  PLT 393 354 374   No results found for: TSH Lab Results  Component Value  Date   HGBA1C 10.3* 02/06/2016   No results found for: CHOL, HDL, LDLCALC, LDLDIRECT, TRIG, CHOLHDL  Significant Diagnostic Results in last 30 days:  No results found.  Assessment/Plan  #1-history of infected orthopedic implant right knee-she is completing twice a day anabiotic oxicillian today she is afebrile she does have follow-up with infectious disease scheduled for tomorrow.  Elevated white count per Dr. Clayborn Heron assessment is fairly benign because she has no symptoms or signs of active infection as noted above.  She had been on a course of prednisone for the Urticarial rash and  this may be contributing to it again she will have follow-up with infectious disease.  #2-history of Urticarial rash-again this appears largely resolved again she did receive antihistamine as well as a short course of prednisone.  #3 history of diabetes type 2 blood sugars appear to be under better control I suspect the controlled environment has been beneficial for this will need follow-up by primary care provider.  #4 history of anemia this appears stable with hemoglobin 11.7 on lab done the day-she is on iron.  Again patient will have follow-up with infectious disease and orthopedics tomorrow-there are others to decrease her aspirin for prophylaxis down to 325 mg daily starting tomorrow as well.  Also PICC line is scheduled to be discontinued tomorrow at discharge.  Patient will be with family member apparently on discharge-she has no acute concerns today appears to have done well. She will need continued PT and OT for further strengthening status post fairly involved surgery.  [id number] note greater than 30 minutes assessing patient reviewing her chart reviewing her labs and discussing her status with Dr. Linna Darner as well as with nursing.       Oralia Manis, [place]

## 2016-04-08 LAB — HM DIABETES EYE EXAM

## 2016-04-13 ENCOUNTER — Encounter: Payer: Self-pay | Admitting: Internal Medicine

## 2016-05-08 ENCOUNTER — Ambulatory Visit: Payer: Medicare HMO | Admitting: Obstetrics & Gynecology

## 2016-05-12 ENCOUNTER — Ambulatory Visit (INDEPENDENT_AMBULATORY_CARE_PROVIDER_SITE_OTHER): Payer: Medicare HMO | Admitting: Obstetrics & Gynecology

## 2016-05-12 ENCOUNTER — Encounter: Payer: Self-pay | Admitting: Obstetrics & Gynecology

## 2016-05-12 VITALS — BP 130/90 | HR 76 | Wt 232.0 lb

## 2016-05-12 DIAGNOSIS — N898 Other specified noninflammatory disorders of vagina: Secondary | ICD-10-CM | POA: Diagnosis not present

## 2016-05-12 DIAGNOSIS — Z1159 Encounter for screening for other viral diseases: Secondary | ICD-10-CM

## 2016-05-12 DIAGNOSIS — N760 Acute vaginitis: Secondary | ICD-10-CM

## 2016-05-12 DIAGNOSIS — Z118 Encounter for screening for other infectious and parasitic diseases: Secondary | ICD-10-CM

## 2016-05-12 DIAGNOSIS — L739 Follicular disorder, unspecified: Secondary | ICD-10-CM | POA: Diagnosis not present

## 2016-05-12 MED ORDER — DOXYCYCLINE MONOHYDRATE 100 MG PO CAPS: 100.0000 mg | ORAL_CAPSULE | Freq: Two times a day (BID) | ORAL | 0 refills | Status: DC

## 2016-05-12 NOTE — Progress Notes (Signed)
      Chief Complaint  Patient presents with  . Vaginal Discharge    Blood pressure 130/90, pulse 76, weight 232 lb (105.2 kg), last menstrual period 01/15/2012.  55 y.o. [id number] Patient's last menstrual period was 01/15/2012. The current method of family planning is none.  Subjective Pt with vaginal discharge for a couple of days, no itching just irritation  Objective Vulva:  Small infected hair follicle Vagina:  normal mucosa, thin grey discharge Cervix:  no lesions Uterus:   Adnexa: ovaries:,     Pertinent ROS   Labs or studies Wet prep with lots of WBC no yeast no trich no BV    Impression Diagnoses this Encounter::   ICD-9-CM ICD-10-CM   1. Vaginitis 616.10 N76.0   2. Folliculitis 123XX123 123XX123   3. Screening for viral and chlamydial diseases V73.98 Z11.59 GC/Chlamydia Probe Amp   V73.99 Z11.8     Established relevant diagnosis(es):   Plan/Recommendations: Meds ordered this encounter  Medications  . doxycycline (MONODOX) 100 MG capsule    Sig: Take 1 capsule (100 mg total) by mouth 2 (two) times daily.    Dispense:  20 capsule    Refill:  0    Labs or Scans Ordered: Orders Placed This Encounter  Procedures  . GC/Chlamydia Probe Amp    Management::   Follow up No Follow-up on file.    All questions were answered.

## 2016-05-14 LAB — GC/CHLAMYDIA PROBE AMP
CHLAMYDIA, DNA PROBE: NEGATIVE
NEISSERIA GONORRHOEAE BY PCR: NEGATIVE

## 2016-05-25 ENCOUNTER — Other Ambulatory Visit: Payer: Self-pay | Admitting: *Deleted

## 2016-05-25 ENCOUNTER — Encounter (HOSPITAL_COMMUNITY)
Admission: RE | Admit: 2016-05-25 | Discharge: 2016-05-25 | Disposition: A | Discharge status: Home / Self Care | Payer: Medicare HMO | Source: Skilled Nursing Facility | Attending: Internal Medicine | Admitting: Internal Medicine

## 2016-05-25 ENCOUNTER — Encounter: Payer: Self-pay | Admitting: Internal Medicine

## 2016-05-25 ENCOUNTER — Non-Acute Institutional Stay: Payer: Medicare HMO | Admitting: Internal Medicine

## 2016-05-25 DIAGNOSIS — Z96651 Presence of right artificial knee joint: Secondary | ICD-10-CM | POA: Insufficient documentation

## 2016-05-25 DIAGNOSIS — N760 Acute vaginitis: Secondary | ICD-10-CM

## 2016-05-25 DIAGNOSIS — D649 Anemia, unspecified: Secondary | ICD-10-CM

## 2016-05-25 DIAGNOSIS — E119 Type 2 diabetes mellitus without complications: Secondary | ICD-10-CM | POA: Diagnosis not present

## 2016-05-25 DIAGNOSIS — X58XXXA Exposure to other specified factors, initial encounter: Secondary | ICD-10-CM | POA: Insufficient documentation

## 2016-05-25 DIAGNOSIS — Z79899 Other long term (current) drug therapy: Secondary | ICD-10-CM | POA: Insufficient documentation

## 2016-05-25 DIAGNOSIS — N946 Dysmenorrhea, unspecified: Secondary | ICD-10-CM

## 2016-05-25 DIAGNOSIS — T847XXD Infection and inflammatory reaction due to other internal orthopedic prosthetic devices, implants and grafts, subsequent encounter: Secondary | ICD-10-CM | POA: Insufficient documentation

## 2016-05-25 LAB — BASIC METABOLIC PANEL
ANION GAP: 10 (ref 5–15)
BUN: 12 mg/dL (ref 6–20)
CALCIUM: 9.4 mg/dL (ref 8.9–10.3)
CO2: 25 mmol/L (ref 22–32)
Chloride: 104 mmol/L (ref 101–111)
Creatinine, Ser: 0.64 mg/dL (ref 0.44–1.00)
GLUCOSE: 99 mg/dL (ref 65–99)
POTASSIUM: 3.7 mmol/L (ref 3.5–5.1)
Sodium: 139 mmol/L (ref 135–145)

## 2016-05-25 LAB — CBC WITH DIFFERENTIAL/PLATELET
BASOS ABS: 0 10*3/uL (ref 0.0–0.1)
Basophils Relative: 0 %
EOS ABS: 0.3 10*3/uL (ref 0.0–0.7)
Eosinophils Relative: 2 %
HCT: 28.2 % — ABNORMAL LOW (ref 36.0–46.0)
Hemoglobin: 9.1 g/dL — ABNORMAL LOW (ref 12.0–15.0)
LYMPHS ABS: 3.3 10*3/uL (ref 0.7–4.0)
LYMPHS PCT: 26 %
MCH: 26.9 pg (ref 26.0–34.0)
MCHC: 32.3 g/dL (ref 30.0–36.0)
MCV: 83.4 fL (ref 78.0–100.0)
Monocytes Absolute: 1.2 10*3/uL — ABNORMAL HIGH (ref 0.1–1.0)
Monocytes Relative: 10 %
NEUTROS PCT: 62 %
Neutro Abs: 7.9 10*3/uL — ABNORMAL HIGH (ref 1.7–7.7)
PLATELETS: 484 10*3/uL — AB (ref 150–400)
RBC: 3.38 MIL/uL — AB (ref 3.87–5.11)
RDW: 14.3 % (ref 11.5–15.5)
WBC: 12.7 10*3/uL — AB (ref 4.0–10.5)

## 2016-05-25 MED ORDER — ZOLPIDEM TARTRATE 10 MG PO TABS: 10.0000 mg | ORAL_TABLET | Freq: Every evening | ORAL | 0 refills | Status: AC | PRN

## 2016-05-25 MED ORDER — OXYCODONE HCL 5 MG PO CAPS: 5.0000 mg | ORAL_CAPSULE | ORAL | 0 refills | Status: DC | PRN

## 2016-05-25 NOTE — Telephone Encounter (Signed)
[facility] #[phone] Fax: [phone]   

## 2016-05-25 NOTE — Progress Notes (Signed)
Provider:  Veleta Miners Location:   [place] Room Number: 144/P Place of Service:  SNF (31)  PCP: Robert Bellow, MD Patient Care Team: Lemmie Evens, MD as PCP - General [facility] Medicine)  Extended Emergency Contact Information Primary Emergency Contact: Nestler,Catherine Address: [address]          [place], [place] [address] Johnnette Litter of [place] Phone: [phone] Relation: Mother Secondary Emergency Contact: Ivy Lynn, [place] [address] Johnnette Litter of [place] Phone: [phone] Mobile Phone: [phone] Relation: Friend  Code Status: Full Code Goals of Care: Advanced Directive information Advanced Directives 05/25/2016  Does patient have an advance directive? Yes  Type of Advance Directive (No Data)  Does patient want to make changes to advanced directive? No - Patient declined  Copy of advanced directive(s) in chart? Yes  Would patient like information on creating an advanced directive? -  Pre-existing out of facility DNR order (yellow form or pink MOST form) -      Chief Complaint  Patient presents with  . New Admit To SNF    HPI: Patient is a 55 y.o. female seen today for admission to SNF for Rehab after TKR revision. Patient initially had surgery in 05/17 . She had Arthroplasty of right total knee with Antibiotic spacer as her first knee prosthetsis got infected. 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 So she had Right knee fusion  Done on 05/18/2016. Patient is admitted for Rehab after surgery. Patient is doing very well. Pain is well controlled and she is moving around in there wheel chair.  Past Medical History:  Diagnosis Date  . Arthritis   . DOE (dyspnea on exertion)   . Eczema   . Glaucoma   . Infection    r knee  . Muscle spasm of back   . Positive ANA (antinuclear antibody)    1:180 homogenous pattern   Past Surgical History:  Procedure Laterality Date  .  IRRIGATION AND DEBRIDEMENT KNEE Right 05/12/2013   Procedure: IRRIGATION AND DEBRIDEMENT KNEE;  Surgeon: Carole Civil, MD;  [facility];  Service: Orthopedics;  Laterality: Right;  . JOINT REPLACEMENT     r knee  . LIPOMA EXCISION  11/16/2011   Procedure: EXCISION LIPOMA;  Surgeon: Scherry Ran, MD;  Location: [facility];  Service: General;  Laterality: Left;  Excision of neoplasm left shoulder  . MOUTH SURGERY    . MULTIPLE EXTRACTIONS WITH ALVEOLOPLASTY  12/28/2011   Procedure: MULTIPLE EXTRACION WITH ALVEOLOPLASTY;  Surgeon: Gae Bon, DDS;  Location: [place];  Service: Oral Surgery;  Laterality: Bilateral;  . PATELLAR TENDON REPAIR Right 05/12/2013   Procedure: PATELLA TENDON REPAIR AND ALLOGRAFT RECONSTRUCTION;  Surgeon: Carole Civil, MD;  Location: [facility];  Service: Orthopedics;  Laterality: Right;  . resection arthroplasty right total knee  01/28/16   Dr Starlyn Skeans, [facility]; antibiotic spacer placed  . SHOULDER SURGERY Left   . TOTAL KNEE ARTHROPLASTY Right 04/17/2013   Procedure: RIGHT TOTAL KNEE ARTHROPLASTY;  Surgeon: Carole Civil, MD;  [facility];  Service: Orthopedics;  Laterality: Right;  . TOTAL KNEE ARTHROPLASTY Right 05/12/2013   Procedure: POLY EXCHANGE;  Surgeon: Carole Civil, MD;  Location: [facility];  Service: Orthopedics;  Laterality: Right;  . TUBAL LIGATION     and burned per patient.     reports that she quit smoking about 3 months ago. Her smoking use included Cigarettes.  She has a 0.10 pack-year smoking history. She has never used smokeless tobacco. She reports that she drinks alcohol. She reports that she does not use drugs. Social History   Social History  . Marital status: Single    Spouse name: N/A  . Number of children: N/A  . Years of education: 93   Occupational History  .  Not Employed   Social History Main Topics  . Smoking status: Former Smoker    Packs/day: 0.10    Years: 1.00    Types: Cigarettes    Quit date:  01/28/2016  . Smokeless tobacco: Never Used     Comment: Discontinued while at hospital and SNF as of 01/28/16 ; previously "socail smoker" age [age]. Never > 1/2 ppd  . Alcohol use 0.0 oz/week     Comment:  occasionally  . Drug use: No  . Sexual activity: Yes   Other Topics Concern  . Not on file   Social History Narrative  . No narrative on file    Functional Status Survey:    Family History  Problem Relation Age of Onset  . Diabetes Brother   . Diabetes Mother   . Multiple sclerosis Sister   . Arthritis Neg Hx   . Anesthesia problems Neg Hx   . Hypotension Neg Hx   . Malignant hyperthermia Neg Hx   . Pseudochol deficiency Neg Hx   . Cancer Neg Hx   . Heart disease Neg Hx   . Stroke Neg Hx     Health Maintenance  Topic Date Due  . INFLUENZA VACCINE  03/31/2016  . FOOT EXAM  02/11/2017 (Originally 11/21/1971)  . URINE MICROALBUMIN  02/11/2017 (Originally 11/21/1971)  . Hepatitis C Screening  02/11/2017 (Originally [birth year]1961)  . HIV Screening  02/11/2017 (Originally 11/20/1976)  . PNEUMOCOCCAL POLYSACCHARIDE VACCINE (1) 02/10/2021 (Originally 11/21/1963)  . COLONOSCOPY  02/19/2022 (Originally 11/21/2011)  . TETANUS/TDAP  02/11/2026 (Originally 11/20/1980)  . HEMOGLOBIN A1C  08/07/2016  . OPHTHALMOLOGY EXAM  04/08/2017  . MAMMOGRAM  11/24/2017  . PAP SMEAR  10/16/2018    Allergies  Allergen Reactions  . Chocolate Hives  . Nystatin Anxiety      Medication List       Accurate as of 05/25/16 10:09 AM. Always use your most recent med list.          aspirin 325 MG tablet From 06/17/16 to 07/01/16 give 325 mg by mouth twice  Day. From 07/02/16 to 08/13/16 give by mouth 325 mg once a day   clobetasol cream 0.05 % Commonly known as:  TEMOVATE Apply 1 application topically 2 (two) times daily.   cyclobenzaprine 10 MG tablet Commonly known as:  FLEXERIL Take 10 mg by mouth 4 (four) times daily as needed for muscle spasms.   diphenhydrAMINE 25 mg capsule Commonly  known as:  BENADRYL Take 25 mg by mouth as needed.   doxycycline 100 MG capsule Commonly known as:  VIBRAMYCIN Take 100 mg by mouth 2 (two) times daily.   enoxaparin 40 MG/0.4ML injection Commonly known as:  LOVENOX Inject 40 mg into the skin daily. Last dose on 06/16/16   ferrous sulfate 325 (65 FE) MG tablet Take 325 mg by mouth 2 (two) times daily.   megestrol 40 MG tablet Commonly known as:  MEGACE TAKE 1 TABLET BY MOUTH ONCE DAILY.   metFORMIN 500 MG tablet Commonly known as:  GLUCOPHAGE Take 500 mg by mouth 2 (two) times daily with a meal.   NON FORMULARY May apply  ice to incision for pain and swelling relief. Ice to be on for 20 minutes then off for 20 minutes. Do not let ice come in direct contact with skin, place in towel and then apply to skin. ( AS NEEDED )   oxycodone 5 MG capsule Commonly known as:  OXY-IR Take 5 mg by mouth every 4 (four) hours as needed.   sennosides-docusate sodium 8.6-50 MG tablet Commonly known as:  SENOKOT-S Take 2 tablets by mouth 2 (two) times daily.   travoprost (benzalkonium) 0.004 % ophthalmic solution Commonly known as:  TRAVATAN Place 1 drop into both eyes at bedtime.   zolpidem 10 MG tablet Commonly known as:  AMBIEN Take 10 mg by mouth at bedtime as needed for sleep.       Review of Systems  Constitutional: Negative.   HENT: Negative.   Respiratory: Negative.   Cardiovascular: Negative.   Gastrointestinal: Negative.     There were no vitals filed for this visit. There is no height or weight on file to calculate BMI. Physical Exam  Constitutional: She is oriented to person, place, and time. She appears well-developed and well-nourished.  HENT:  Head: Normocephalic.  Mouth/Throat: Oropharynx is clear and moist.  Eyes: Pupils are equal, round, and reactive to light.  Cardiovascular: Normal rate, regular rhythm and normal heart sounds.   Pulmonary/Chest: Effort normal and breath sounds normal. No respiratory  distress. She has no wheezes. She has no rales.  Abdominal: Soft. She exhibits no distension. There is no tenderness. There is no rebound and no guarding.  Musculoskeletal: She exhibits edema.  Neurological: She is alert and oriented to person, place, and time.    Labs reviewed: Basic Metabolic Panel:  Recent Labs  02/25/16 1234  03/03/16 0930 03/10/16 1000 05/25/16 0730  NA 133*  --   --  134* 139  K 4.1  --   --  3.8 3.7  CL 106  --   --  104 104  CO2 21*  --   --  26 25  GLUCOSE 142*  --   --  161* 99  BUN 14  < > 13 13 12   CREATININE 0.63  < > 0.64 0.60 0.64  CALCIUM 9.5  --   --  8.6* 9.4  < > = values in this interval not displayed. Liver Function Tests: No results for input(s): AST, ALT, ALKPHOS, BILITOT, PROT, ALBUMIN in the last 8760 hours. No results for input(s): LIPASE, AMYLASE in the last 8760 hours. No results for input(s): AMMONIA in the last 8760 hours. CBC:  Recent Labs  03/03/16 0930 03/10/16 1000 05/25/16 0730  WBC 11.8* 14.3* 12.7*  NEUTROABS 8.7* 8.9* 7.9*  HGB 12.5 11.7* 9.1*  HCT 38.2 36.3 28.2*  MCV 82.2 81.8 83.4  PLT 354 374 484*   Cardiac Enzymes: No results for input(s): CKTOTAL, CKMB, CKMBINDEX, TROPONINI in the last 8760 hours. BNP: Invalid input(s): POCBNP Lab Results  Component Value Date   HGBA1C 10.3 (H) 02/06/2016        Imaging and Procedures obtained prior to SNF admission: No results found.  Assessment/Plan  S/P Right Knee Fusion  Patient had Right Knee Revision and fusion secondary to initial infection. Her pain is well controlled. Continue PRN flexeril and Oxycodone PRN Also On Lovenox for DVT prophylaxis  Diabetes Mellitus type 2  Patient is only on Metformin. Accu check BID. Last HGBA1C was 6.8 in 07/17  Anemia  Most likely Post op. Last HGB 9.1 Repeat Labs in week  to follow HGB. Will continue Iron Supplement  Vaginitis  Patient was started on Doxycyline for Vaginitis. Should get 2 more  days     Family/ staff Communication:   Labs/tests ordered:

## 2016-06-01 ENCOUNTER — Other Ambulatory Visit (HOSPITAL_COMMUNITY)
Admission: RE | Admit: 2016-06-01 | Discharge: 2016-06-01 | Disposition: A | Discharge status: Home / Self Care | Payer: Medicare Other | Source: Skilled Nursing Facility | Attending: Internal Medicine | Admitting: Internal Medicine

## 2016-06-01 DIAGNOSIS — Z471 Aftercare following joint replacement surgery: Secondary | ICD-10-CM | POA: Diagnosis present

## 2016-06-01 DIAGNOSIS — Z79899 Other long term (current) drug therapy: Secondary | ICD-10-CM | POA: Insufficient documentation

## 2016-06-01 DIAGNOSIS — Z720 Tobacco use: Secondary | ICD-10-CM | POA: Insufficient documentation

## 2016-06-01 DIAGNOSIS — Z96651 Presence of right artificial knee joint: Secondary | ICD-10-CM | POA: Insufficient documentation

## 2016-06-01 LAB — CBC WITH DIFFERENTIAL/PLATELET
Basophils Absolute: 0 10*3/uL (ref 0.0–0.1)
Basophils Relative: 0 %
EOS ABS: 0.3 10*3/uL (ref 0.0–0.7)
Eosinophils Relative: 2 %
HEMATOCRIT: 31.8 % — AB (ref 36.0–46.0)
HEMOGLOBIN: 10.1 g/dL — AB (ref 12.0–15.0)
LYMPHS ABS: 3 10*3/uL (ref 0.7–4.0)
Lymphocytes Relative: 26 %
MCH: 27.1 pg (ref 26.0–34.0)
MCHC: 31.8 g/dL (ref 30.0–36.0)
MCV: 85.3 fL (ref 78.0–100.0)
MONOS PCT: 8 %
Monocytes Absolute: 0.9 10*3/uL (ref 0.1–1.0)
NEUTROS ABS: 7.1 10*3/uL (ref 1.7–7.7)
NEUTROS PCT: 64 %
Platelets: 595 10*3/uL — ABNORMAL HIGH (ref 150–400)
RBC: 3.73 MIL/uL — AB (ref 3.87–5.11)
RDW: 16.1 % — ABNORMAL HIGH (ref 11.5–15.5)
WBC: 11.3 10*3/uL — AB (ref 4.0–10.5)

## 2016-06-02 LAB — HEMOGLOBIN A1C
Hgb A1c MFr Bld: 6.4 % — ABNORMAL HIGH (ref 4.8–5.6)
Mean Plasma Glucose: 137 mg/dL

## 2016-06-05 ENCOUNTER — Non-Acute Institutional Stay: Payer: Medicare HMO | Admitting: Internal Medicine

## 2016-06-05 DIAGNOSIS — D62 Acute posthemorrhagic anemia: Secondary | ICD-10-CM

## 2016-06-05 DIAGNOSIS — K1120 Sialoadenitis, unspecified: Secondary | ICD-10-CM | POA: Diagnosis not present

## 2016-06-06 ENCOUNTER — Encounter (HOSPITAL_COMMUNITY)
Admission: RE | Admit: 2016-06-06 | Discharge: 2016-06-06 | Disposition: A | Discharge status: Home / Self Care | Payer: Medicare HMO | Source: Skilled Nursing Facility | Attending: Internal Medicine | Admitting: Internal Medicine

## 2016-06-06 LAB — BASIC METABOLIC PANEL
Anion gap: 11 (ref 5–15)
BUN: 11 mg/dL (ref 6–20)
CALCIUM: 9.4 mg/dL (ref 8.9–10.3)
CO2: 22 mmol/L (ref 22–32)
CREATININE: 0.77 mg/dL (ref 0.44–1.00)
Chloride: 105 mmol/L (ref 101–111)
GFR calc non Af Amer: >60 mL/min (ref >60)
GLUCOSE: 160 mg/dL — AB (ref 65–99)
Potassium: 4 mmol/L (ref 3.5–5.1)
Sodium: 138 mmol/L (ref 135–145)

## 2016-06-06 LAB — CBC WITH DIFFERENTIAL/PLATELET
BASOS PCT: 0 %
Basophils Absolute: 0.1 10*3/uL (ref 0.0–0.1)
EOS ABS: 0.3 10*3/uL (ref 0.0–0.7)
Eosinophils Relative: 2 %
HCT: 38.4 % (ref 36.0–46.0)
Hemoglobin: 12.6 g/dL (ref 12.0–15.0)
Lymphocytes Relative: 26 %
Lymphs Abs: 2.9 10*3/uL (ref 0.7–4.0)
MCH: 28.1 pg (ref 26.0–34.0)
MCHC: 32.8 g/dL (ref 30.0–36.0)
MCV: 85.7 fL (ref 78.0–100.0)
MONO ABS: 0.8 10*3/uL (ref 0.1–1.0)
MONOS PCT: 7 %
Neutro Abs: 7.2 10*3/uL (ref 1.7–7.7)
Neutrophils Relative %: 65 %
Platelets: 512 10*3/uL — ABNORMAL HIGH (ref 150–400)
RBC: 4.48 MIL/uL (ref 3.87–5.11)
RDW: 16.7 % — AB (ref 11.5–15.5)
WBC: 11.3 10*3/uL — ABNORMAL HIGH (ref 4.0–10.5)

## 2016-06-06 NOTE — Progress Notes (Signed)
This is an acute visit.  Level care skilled.  Facility is [facility].  Chief complaint-acute visit secondary to right cheek here discomfort.  History of present illness. Patient is a pleasant 55 year old female here for rehabilitation after having an arthroplasty the right total knee with antibiotic spacer placed as first knee prosthesis got infected.  She previously had a chronic right knee infection with allograft tissue-so she had a right knee effusion that was done on 05/18/2016.  Regards to rehabilitation she appears to be doing well.  Nursing states however she is complaining of some right ear discomfort I'm following up on this she denies any fever chills or difficulty swallowing vital signs appear to be stable she is afebrile.  Past Medical History:  Diagnosis Date  . Arthritis   . DOE (dyspnea on exertion)   . Eczema   . Glaucoma   . Infection    r knee  . Muscle spasm of back   . Positive ANA (antinuclear antibody)    1:180 homogenous pattern        Past Surgical History:  Procedure Laterality Date  . IRRIGATION AND DEBRIDEMENT KNEE Right 05/12/2013   Procedure: IRRIGATION AND DEBRIDEMENT KNEE;  Surgeon: Carole Civil, MD;  Location: [facility];  Service: Orthopedics;  Laterality: Right;  . JOINT REPLACEMENT     r knee  . LIPOMA EXCISION  11/16/2011   Procedure: EXCISION LIPOMA;  Surgeon: Scherry Ran, MD;  Location: [facility];  Service: General;  Laterality: Left;  Excision of neoplasm left shoulder  . MOUTH SURGERY    . MULTIPLE EXTRACTIONS WITH ALVEOLOPLASTY  12/28/2011   Procedure: MULTIPLE EXTRACION WITH ALVEOLOPLASTY;  Surgeon: Gae Bon, DDS;  Location: [place];  Service: Oral Surgery;  Laterality: Bilateral;  . PATELLAR TENDON REPAIR Right 05/12/2013   Procedure: PATELLA TENDON REPAIR AND ALLOGRAFT RECONSTRUCTION;  Surgeon: Carole Civil, MD;  Location: [facility];  Service: Orthopedics;  Laterality: Right;  . resection  arthroplasty right total knee  01/28/16   Dr Starlyn Skeans, [facility]; antibiotic spacer placed  . SHOULDER SURGERY Left   . TOTAL KNEE ARTHROPLASTY Right 04/17/2013   Procedure: RIGHT TOTAL KNEE ARTHROPLASTY;  Surgeon: Carole Civil, MD;  [facility]: [facility];  Service: Orthopedics;  Laterality: Right;  . TOTAL KNEE ARTHROPLASTY Right 05/12/2013   Procedure: POLY EXCHANGE;  Surgeon: Carole Civil, MD;  Location: [facility];  Service: Orthopedics;  Laterality: Right;  . TUBAL LIGATION     and burned per patient.     reports that she quit smoking about 3 months ago. Her smoking use included Cigarettes. She has a 0.10 pack-year smoking history. She has never used smokeless tobacco. She reports that she drinks alcohol. She reports that she does not use drugs. Social History        Social History  . Marital status: Single    Spouse name: N/A  . Number of children: N/A  . Years of education: 37       Occupational History  .  Not Employed         Social History Main Topics  . Smoking status: Former Smoker    Packs/day: 0.10    Years: 1.00    Types: Cigarettes    Quit date: 01/28/2016  . Smokeless tobacco: Never Used     Comment: Discontinued while at hospital and SNF as of 01/28/16 ; previously "socail smoker" age [age]. Never > 1/2 ppd  . Alcohol use 0.0 oz/week  Comment:  occasionally  . Drug use: No  . Sexual activity: Yes       Other Topics Concern  . Not on file      Social History Narrative  . No narrative on file    Functional Status Survey:         Family History  Problem Relation Age of Onset  . Diabetes Brother   . Diabetes Mother   . Multiple sclerosis Sister   . Arthritis Neg Hx   . Anesthesia problems Neg Hx   . Hypotension Neg Hx   . Malignant hyperthermia Neg Hx   . Pseudochol deficiency Neg Hx   . Cancer Neg Hx   . Heart disease Neg Hx   . Stroke Neg Hx         Health Maintenance  Topic Date Due    . INFLUENZA VACCINE  03/31/2016  . FOOT EXAM  02/11/2017 (Originally 11/21/1971)  . URINE MICROALBUMIN  02/11/2017 (Originally 11/21/1971)  . Hepatitis C Screening  02/11/2017 (Originally 08-Oct-1960)  . HIV Screening  02/11/2017 (Originally 11/20/1976)  . PNEUMOCOCCAL POLYSACCHARIDE VACCINE (1) 02/10/2021 (Originally 11/21/1963)  . COLONOSCOPY  02/19/2022 (Originally 11/21/2011)  . TETANUS/TDAP  02/11/2026 (Originally 11/20/1980)  . HEMOGLOBIN A1C  08/07/2016  . OPHTHALMOLOGY EXAM  04/08/2017  . MAMMOGRAM  11/24/2017  . PAP SMEAR  10/16/2018        Allergies  Allergen Reactions  . Chocolate Hives  . Nystatin Anxiety          Medication List           Accurate as of 05/25/16 10:09 AM. Always use your most recent med list.           aspirin 325 MG tablet From 06/17/16 to 07/01/16 give 325 mg by mouth twice  Day. From 07/02/16 to 08/13/16 give by mouth 325 mg once a day  clobetasol cream 0.05 % Commonly known as:  TEMOVATE Apply 1 application topically 2 (two) times daily.  cyclobenzaprine 10 MG tablet Commonly known as:  FLEXERIL Take 10 mg by mouth 4 (four) times daily as needed for muscle spasms.  diphenhydrAMINE 25 mg capsule Commonly known as:  BENADRYL Take 25 mg by mouth as needed.   .  enoxaparin 40 MG/0.4ML injection Commonly known as:  LOVENOX Inject 40 mg into the skin daily. Last dose on 06/16/16  ferrous sulfate 325 (65 FE) MG tablet Take 325 mg by mouth 2 (two) times daily.  megestrol 40 MG tablet Commonly known as:  MEGACE TAKE 1 TABLET BY MOUTH ONCE DAILY.  metFORMIN 500 MG tablet Commonly known as:  GLUCOPHAGE Take 500 mg by mouth 2 (two) times daily with a meal.  NON FORMULARY May apply ice to incision for pain and swelling relief. Ice to be on for 20 minutes then off for 20 minutes. Do not let ice come in direct contact with skin, place in towel and then apply to skin. ( AS NEEDED )  oxycodone 5 MG capsule Commonly known as:  OXY-IR Take  5 mg by mouth every 4 (four) hours as needed.  sennosides-docusate sodium 8.6-50 MG tablet Commonly known as:  SENOKOT-S Take 2 tablets by mouth 2 (two) times daily.  travoprost (benzalkonium) 0.004 % ophthalmic solution Commonly known as:  TRAVATAN Place 1 drop into both eyes at bedtime.  zolpidem 10 MG tablet Commonly known as:  AMBIEN Take 10 mg by mouth at bedtime as needed for sleep.     Review of systems.  In  general is not complaining of a fever chills.  Skin does not complain of rashes or itching surgical site right knee appears to be healing unremarkably.  Head ears eyes nose mouth and throat again is complaining of some right ear and right side facial discomfort.  Does not complain of any visual changes or difficulty swallowing.  Respiratory is not complaining of any shortness of breath cough.  Cardiac no chest pain.  GI is not complaining of any abdominal discomfort nausea vomiting diarrhea or constipation.  Muscle skeletal is not complaining of joint pain currently knee pain is currently controlled.  Neurologic is not complaining of dizziness headache or numbness.  Psych she is not complaining of any overt anxiety or depression continues to be in good spirits.  Physical exam.  Temperature is 97.0 pulse 71 respirations 18 blood pressure 132/67 O2 saturation is 97% on room air.  In general this is a pleasant middle-aged female in no distress resting comfortably in bed.  Her skin is warm and dry.  Oropharynx clear mucous membranes moist.  Ears tympanic membranes are visualized bilaterally I do not really see any drainage erythema or exudate from either here.  However there is tenderness to palpation under her right ear area with some mild swelling.  Chest is clear to auscultation there is no labored breathing.  Heart is regular rate and rhythm without murmur gallop or rub.  Abdomen soft obese soft nontender with positive bowel sounds.  Muscle skeletal  does have a well healing scar in the right knee her pedal pulses are intact bilaterally there is not really significant pedal edema bilaterally moves all her extremities 4 again with limited mobility of her right leg secondary to the surgery.  Neurologic is grossly intact her speech is clear no lateralizing findings.  Psych she is alert and oriented pleasant and appropriate  Labs.  06/01/2016.  WBC 11.3 hemoglobin 10.1 platelets 595.  05/25/2016.  Sodium 139 potassium 3.7 BUN 12 creatinine 0.64.  Assessment and plan.  #1-right ear pain-this appears at this point to be more of a parotid gland issue this appears to be somewhat inflamed-will treat with Augmentin 875 mg twice a day for 7 days and add a probiotic twice a day for 10 days as well monitor for resolution.  #2 anemia this appears improved she has been started on iron most recent hemoglobin of 10.1 is up from 9.1 back on 05/25/2016.  #3 history of mild leukocytosis this appears to be trending down with her apparent  infection here would like see what her white count he has will update a CBC as well as a metabolic panel tomorrow  [id number]

## 2016-06-11 ENCOUNTER — Encounter: Payer: Self-pay | Admitting: Internal Medicine

## 2016-06-11 ENCOUNTER — Non-Acute Institutional Stay (SKILLED_NURSING_FACILITY): Payer: Medicare HMO | Admitting: Internal Medicine

## 2016-06-11 DIAGNOSIS — E119 Type 2 diabetes mellitus without complications: Secondary | ICD-10-CM

## 2016-06-11 DIAGNOSIS — D649 Anemia, unspecified: Secondary | ICD-10-CM | POA: Diagnosis not present

## 2016-06-11 DIAGNOSIS — N760 Acute vaginitis: Secondary | ICD-10-CM | POA: Diagnosis not present

## 2016-06-11 NOTE — Progress Notes (Signed)
Location:   [facility] Room Number: 144/P Place of Service:  SNF [phone]) Provider:  Leroy Libman, MD  Patient Care Team: Lemmie Evens, MD as PCP - General [facility] Medicine)  Extended Emergency Contact Information Primary Emergency Contact: Livecchi,Catherine Address: [address]          [place], [place] [address] Johnnette Litter of [place] Phone: [phone] Relation: Mother Secondary Emergency Contact: Ivy Lynn,  [address] Johnnette Litter of [place] Phone: [phone] Mobile Phone: [phone] Relation: Friend  Code Status:  Full Code Goals of care: Advanced Directive information Advanced Directives 06/11/2016  Does patient have an advance directive? Yes  Type of Advance Directive (No Data)  Does patient want to make changes to advanced directive? No - Patient declined  Copy of advanced directive(s) in chart? Yes  Would patient like information on creating an advanced directive? -  Pre-existing out of facility DNR order (yellow form or pink MOST form) -     Chief Complaint  Patient presents with  . Acute Visit    Vaginal Discharge    HPI:  Pt is a 55 y.o. female seen today for an acute visit for Vaginal discharge. Patient was diagnosed with Vaginitis in 09/12 and got doxycycline for 1 week. Patient. says her discharge improved on it never completely gone but it back. It is not itchy or smelly. It does stain her clothes. She has been in the facility for past 3 weeks and not sexually active right now. She also was diagnosed with ear infection was started on Augmentin which she has almost finished the course. She also has h/o Diabetes and BS running in 120-180. Patient is set for discharge next week as she is doing well with therapy for her Right Knee   Past Medical History:  Diagnosis Date  . Arthritis   . DOE (dyspnea on exertion)   . Eczema   . Glaucoma   . Infection    r knee  . Muscle  spasm of back   . Positive ANA (antinuclear antibody)    1:180 homogenous pattern   Past Surgical History:  Procedure Laterality Date  . IRRIGATION AND DEBRIDEMENT KNEE Right 05/12/2013   Procedure: IRRIGATION AND DEBRIDEMENT KNEE;  Surgeon: Carole Civil, MD;  Location: [facility];  Service: Orthopedics;  Laterality: Right;  . JOINT REPLACEMENT     r knee  . LIPOMA EXCISION  11/16/2011   Procedure: EXCISION LIPOMA;  Surgeon: Scherry Ran, MD;  Location: [facility];  Service: General;  Laterality: Left;  Excision of neoplasm left shoulder  . MOUTH SURGERY    . MULTIPLE EXTRACTIONS WITH ALVEOLOPLASTY  12/28/2011   Procedure: MULTIPLE EXTRACION WITH ALVEOLOPLASTY;  Surgeon: Gae Bon, DDS;  Location: [facility];  Service: Oral Surgery;  Laterality: Bilateral;  . PATELLAR TENDON REPAIR Right 05/12/2013   Procedure: PATELLA TENDON REPAIR AND ALLOGRAFT RECONSTRUCTION;  Surgeon: Carole Civil, MD;  Location: [facility];  Service: Orthopedics;  Laterality: Right;  . resection arthroplasty right total knee  01/28/16   Dr St[facility]n [facility]ans, [facility]; antibiotic spacer placed  . SHOULDER SURGERY Left   . TOTAL KNEE ARTHROPLASTY Right 04/17/2013   Procedure: RIGHT TOTAL KNEE ARTHROPLASTY;  Surgeon: Carole Civil, MD;  Location: [facility];  Service: Orthopedics;  Laterality: Right;  . TOTAL KNEE ARTHROPLASTY Right 05/12/2013   Procedure: POLY EXCHANGE;  Surgeon: Carole Civil, MD;  Location: [facility];  Service:  Orthopedics;  Laterality: Right;  . TUBAL LIGATION     and burned per patient.     Allergies  Allergen Reactions  . Chocolate Hives  . Nystatin Anxiety      Medication List       Accurate as of 06/11/16  1:55 PM. Always use your most recent med list.          amoxicillin-clavulanate 875-125 MG tablet Commonly known as:  AUGMENTIN Take 1 tablet by mouth 2 (two) times daily. Give for 7 days stop date 06/12/16   aspirin 325 MG tablet From 06/17/16 to 07/01/16 give 325 mg by mouth  twice  Day. From 07/02/16 to 08/13/16 give by mouth 325 mg once a day   clobetasol cream 0.05 % Commonly known as:  TEMOVATE Apply 1 application topically 2 (two) times daily.   cyclobenzaprine 10 MG tablet Commonly known as:  FLEXERIL Take 10 mg by mouth 4 (four) times daily as needed for muscle spasms.   diphenhydrAMINE 25 mg capsule Commonly known as:  BENADRYL Take 25 mg by mouth as needed.   enoxaparin 40 MG/0.4ML injection Commonly known as:  LOVENOX Inject 40 mg into the skin daily. Last dose on 06/16/16   ferrous sulfate 325 (65 FE) MG tablet Take 325 mg by mouth 2 (two) times daily.   megestrol 40 MG tablet Commonly known as:  MEGACE TAKE 1 TABLET BY MOUTH ONCE DAILY.   metFORMIN 500 MG tablet Commonly known as:  GLUCOPHAGE Take 500 mg by mouth 2 (two) times daily with a meal.   NON FORMULARY May apply ice to incision for pain and swelling relief. Ice to be on for 20 minutes then off for 20 minutes. Do not let ice come in direct contact with skin, place in towel and then apply to skin. ( AS NEEDED )   oxycodone 5 MG capsule Commonly known as:  OXY-IR Take 1 capsule (5 mg total) by mouth every 4 (four) hours as needed.   RISA-BID PROBIOTIC Tabs Give one tablet by mouth for 10 days stop date 06/15/16   sennosides-docusate sodium 8.6-50 MG tablet Commonly known as:  SENOKOT-S Take 2 tablets by mouth 2 (two) times daily.   travoprost (benzalkonium) 0.004 % ophthalmic solution Commonly known as:  TRAVATAN Place 1 drop into both eyes at bedtime.   zolpidem 10 MG tablet Commonly known as:  AMBIEN Take 1 tablet (10 mg total) by mouth at bedtime as needed for sleep.       Review of Systems  Immunization History  Administered Date(s) Administered  . PPD Test 02/20/2016   Pertinent  Health Maintenance Due  Topic Date Due  . INFLUENZA VACCINE  07/31/2016 (Originally 03/31/2016)  . FOOT EXAM  02/11/2017 (Originally [birth year]21/1971)  . URINE MICROALBUMIN  02/11/2017  (Originally [birth year]21/1970)  . COLONOSCOPY  02/19/2022 (Originally 11/21/2011)  . HEMOGLOBIN A1C  11/30/2016  . OPHTHALMOLOGY EXAM  04/08/2017  . MAMMOGRAM  11/24/2017  . PAP SMEAR  10/16/2018   No flowsheet data found. Functional Status Survey:    There were no vitals filed for this visit. There is no height or weight on file to calculate BMI. Physical Exam  Constitutional: She is oriented to person, place, and time. She appears well-developed and well-nourished.  HENT:  Head: Normocephalic.  Cardiovascular: Normal rate, regular rhythm and normal heart sounds.   Pulmonary/Chest: Effort normal and breath sounds normal.  Abdominal: Soft. Bowel sounds are normal. She exhibits no distension. There is no tenderness. There is no  rebound.  Genitourinary:  Genitourinary Comments: It is hard to do vaginal exam in the facility but patient did not have any rash outside did have white discharge from her Vagina.  Neurological: She is alert and oriented to person, place, and time.    Labs reviewed:  Recent Labs  03/10/16 1000 05/25/16 0730 06/06/16 0600  NA 134* 139 138  K 3.8 3.7 4.0  CL 104 104 105  CO2 26 25 22   GLUCOSE 161* 99 160*  BUN 13 12 11   CREATININE 0.60 0.64 0.77  CALCIUM 8.6* 9.4 9.4   No results for input(s): AST, ALT, ALKPHOS, BILITOT, PROT, ALBUMIN in the last 8760 hours.  Recent Labs  05/25/16 0730 06/01/16 0615 06/06/16 0600  WBC 12.7* 11.3* 11.3*  NEUTROABS 7.9* 7.1 7.2  HGB 9.1* 10.1* 12.6  HCT 28.2* 31.8* 38.4  MCV 83.4 85.3 85.7  PLT 484* 595* 512*   No results found for: TSH Lab Results  Component Value Date   HGBA1C 6.4 (H) 06/01/2016   No results found for: CHOL, HDL, LDLCALC, LDLDIRECT, TRIG, CHOLHDL  Significant Diagnostic Results in last 30 days:  No results found.  Assessment/Plan  Vaginitis It is hard to do wet mont here. Will treat her empirically with Doxycyline 100 mg BID for 7 days. If it does not improve she needs to be evaluated  by GYN.  Diabetes mellitus Stable on Metformin HGB A1C was 6.4  Anemia Hgb stable on Iron supplement Would be discharged and follow up Hgb as out patient.  Family/ staff Communication:   Labs/tests ordered:

## 2016-06-12 ENCOUNTER — Encounter: Payer: Self-pay | Admitting: Internal Medicine

## 2016-06-12 NOTE — Progress Notes (Signed)
Location:   [facility] Room Number: 144/P Place of Service:  SNF [phone]) Provider:  Mayo Ao, MD  Patient Care Team: Lemmie Evens, MD as PCP - General [facility] Medicine)  Extended Emergency Contact Information Primary Emergency Contact: Driggers,Catherine Address: [address]          [place], [place] [address] Johnnette Litter of [place] Phone: [phone] Relation: Mother Secondary Emergency Contact: Ivy Lynn, [place] [address] Johnnette Litter of [place] Phone: [phone] Mobile Phone: [phone] Relation: Friend  Code Status:  Full Code Goals of care: Advanced Directive information Advanced Directives 06/12/2016  Does patient have an advance directive? Yes  Type of Advance Directive (No Data)  Does patient want to make changes to advanced directive? No - Patient declined  Copy of advanced directive(s) in chart? Yes  Would patient like information on creating an advanced directive? -  Pre-existing out of facility DNR order (yellow form or pink MOST form) -     Chief Complaint  Patient presents with  . Acute Visit    Parotid Parotitis    HPI:  Pt is a 55 y.o. female seen today for an acute visit for    Past Medical History:  Diagnosis Date  . Arthritis   . DOE (dyspnea on exertion)   . Eczema   . Glaucoma   . Infection    r knee  . Muscle spasm of back   . Positive ANA (antinuclear antibody)    1:180 homogenous pattern   Past Surgical History:  Procedure Laterality Date  . IRRIGATION AND DEBRIDEMENT KNEE Right 05/12/2013   Procedure: IRRIGATION AND DEBRIDEMENT KNEE;  Surgeon: Carole Civil, MD;  [facility];  Service: Orthopedics;  Laterality: Right;  . JOINT REPLACEMENT     r knee  . LIPOMA EXCISION  11/16/2011   Procedure: EXCISION LIPOMA;  Surgeon: Scherry Ran, MD;  Location: [facility];  Service: General;  Laterality: Left;  Excision of neoplasm left shoulder  . MOUTH  SURGERY    . MULTIPLE EXTRACTIONS WITH ALVEOLOPLASTY  12/28/2011   Procedure: MULTIPLE EXTRACION WITH ALVEOLOPLASTY;  Surgeon: Gae Bon, DDS;  Location: [facility];  Service: Oral Surgery;  Laterality: Bilateral;  . PATELLAR TENDON REPAIR Right 05/12/2013   Procedure: PATELLA TENDON REPAIR AND ALLOGRAFT RECONSTRUCTION;  Surgeon: Carole Civil, MD;  [facility];  Service: Orthopedics;  Laterality: Right;  . resection arthroplasty right total knee  01/28/16   Dr Starlyn Skeans, [facility]; antibiotic spacer placed  . SHOULDER SURGERY Left   . TOTAL KNEE ARTHROPLASTY Right 04/17/2013   Procedure: RIGHT TOTAL KNEE ARTHROPLASTY;  Surgeon: Carole Civil, MD;  [facility];  Service: Orthopedics;  Laterality: Right;  . TOTAL KNEE ARTHROPLASTY Right 05/12/2013   Procedure: POLY EXCHANGE;  Surgeon: Carole Civil, MD;  Location: [facility];  Service: Orthopedics;  Laterality: Right;  . TUBAL LIGATION     and burned per patient.     Allergies  Allergen Reactions  . Chocolate Hives  . Nystatin Anxiety      Medication List       Accurate as of 06/12/16  4:15 PM. Always use your most recent med list.          aspirin 325 MG tablet From 06/17/16 to 07/01/16 give 325 mg by mouth twice  Day. From 07/02/16 to 08/13/16 give by mouth 325 mg once a day   clobetasol  cream 0.05 % Commonly known as:  TEMOVATE Apply 1 application topically 2 (two) times daily.   cyclobenzaprine 10 MG tablet Commonly known as:  FLEXERIL Take 10 mg by mouth 4 (four) times daily as needed for muscle spasms.   diphenhydrAMINE 25 mg capsule Commonly known as:  BENADRYL Take 25 mg by mouth as needed.   enoxaparin 40 MG/0.4ML injection Commonly known as:  LOVENOX Inject 40 mg into the skin daily. Last dose on 06/16/16   ferrous sulfate 325 (65 FE) MG tablet Take 325 mg by mouth 2 (two) times daily.   megestrol 40 MG tablet Commonly known as:  MEGACE TAKE 1 TABLET BY MOUTH ONCE DAILY.   metFORMIN 500  MG tablet Commonly known as:  GLUCOPHAGE Take 500 mg by mouth 2 (two) times daily with a meal.   NON FORMULARY May apply ice to incision for pain and swelling relief. Ice to be on for 20 minutes then off for 20 minutes. Do not let ice come in direct contact with skin, place in towel and then apply to skin. ( AS NEEDED )   oxycodone 5 MG capsule Commonly known as:  OXY-IR Take 1 capsule (5 mg total) by mouth every 4 (four) hours as needed.   RISA-BID PROBIOTIC Tabs Give one tablet twice a day by mouth for 10 days stop date 06/15/16   sennosides-docusate sodium 8.6-50 MG tablet Commonly known as:  SENOKOT-S Take 2 tablets by mouth 2 (two) times daily.   travoprost (benzalkonium) 0.004 % ophthalmic solution Commonly known as:  TRAVATAN Place 1 drop into both eyes at bedtime.   zolpidem 10 MG tablet Commonly known as:  AMBIEN Take 1 tablet (10 mg total) by mouth at bedtime as needed for sleep.       Review of Systems  Immunization History  Administered Date(s) Administered  . PPD Test 02/20/2016   Pertinent  Health Maintenance Due  Topic Date Due  . INFLUENZA VACCINE  07/31/2016 (Originally 03/31/2016)  . FOOT EXAM  02/11/2017 (Originally [birth year]21/1970)  . URINE MICROALBUMIN  02/11/2017 (Originally 11[birth year]21/1970)  . COLONOSCOPY  02/19/2022 (Originally 11/21/2011)  . HEMOGLOBIN A1C  11/30/2016  . OPHTHALMOLOGY EXAM  04/08/2017  . MAMMOGRAM  11/24/2017  . PAP SMEAR  10/16/2018   No flowsheet data found. Functional Status Survey:    Vitals:   06/12/16 1606  BP: 123/80  Pulse: 84  Resp: 16  Temp: 98 F (36.7 C)  TempSrc: Oral   There is no height or weight on file to calculate BMI. Physical Exam  Labs reviewed:  Recent Labs  03/10/16 1000 05/25/16 0730 06/06/16 0600  NA 134* 139 138  K 3.8 3.7 4.0  CL 104 104 105  CO2 26 25 22   GLUCOSE 161* 99 160*  BUN 13 12 11   CREATININE 0.60 0.64 0.77  CALCIUM 8.6* 9.4 9.4   No results for input(s): AST, ALT, ALKPHOS,  BILITOT, PROT, ALBUMIN in the last 8760 hours.  Recent Labs  05/25/16 0730 06/01/16 0615 06/06/16 0600  WBC 12.7* 11.3* 11.3*  NEUTROABS 7.9* 7.1 7.2  HGB 9.1* 10.1* 12.6  HCT 28.2* 31.8* 38.4  MCV 83.4 85.3 85.7  PLT 484* 595* 512*   No results found for: TSH Lab Results  Component Value Date   HGBA1C 6.4 (H) 06/01/2016   No results found for: CHOL, HDL, LDLCALC, LDLDIRECT, TRIG, CHOLHDL  Significant Diagnostic Results in last 30 days:  No results found.  Assessment/Plan There are no diagnoses linked to this encounter.  Oralia Manis, [place]

## 2016-06-15 ENCOUNTER — Encounter: Payer: Self-pay | Admitting: Internal Medicine

## 2016-06-15 ENCOUNTER — Non-Acute Institutional Stay: Payer: Medicare HMO | Admitting: Internal Medicine

## 2016-06-15 DIAGNOSIS — Z96651 Presence of right artificial knee joint: Secondary | ICD-10-CM | POA: Diagnosis not present

## 2016-06-15 DIAGNOSIS — K0889 Other specified disorders of teeth and supporting structures: Secondary | ICD-10-CM

## 2016-06-15 DIAGNOSIS — D62 Acute posthemorrhagic anemia: Secondary | ICD-10-CM

## 2016-06-15 DIAGNOSIS — N760 Acute vaginitis: Secondary | ICD-10-CM

## 2016-06-15 DIAGNOSIS — E119 Type 2 diabetes mellitus without complications: Secondary | ICD-10-CM

## 2016-06-15 NOTE — Progress Notes (Signed)
Location:   [facility] Room Number: 144/P Place of Service:  SNF ([id number])  Provider: Granville Lewis  PCP: Robert Bellow, MD Patient Care Team: Lemmie Evens, MD as PCP - General (Family Medicine)  Extended Emergency Contact Information Primary Emergency Contact: Fullman,Catherine Address: [address]          [place], [place] [address] Johnnette Litter of [place] Phone: [phone] Relation: Mother Secondary Emergency Contact: Ivy Lynn, [place] [address] Johnnette Litter of [place] Phone: [phone] Mobile Phone: [phone] Relation: Friend  Code Status: Full Code Goals of care:  Advanced Directive information Advanced Directives 06/15/2016  Does patient have an advance directive? Yes  Type of Advance Directive (No Data)  Does patient want to make changes to advanced directive? No - Patient declined  Copy of advanced directive(s) in chart? Yes  Would patient like information on creating an advanced directive? -  Pre-existing out of facility DNR order (yellow form or pink MOST form) -     Allergies  Allergen Reactions  . Chocolate Hives  . Nystatin Anxiety    Chief Complaint  Patient presents with  . Discharge Note    HPI:  55 y.o. female  seen today for discharge later this week.  She is been here for rehabilitation after the total right knee revision.  She initially a surgery back in May she had arthroplasty of the right total knee with antibiotic spacer as her first knee prosthesis got infected.  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  So she subsequently had a right knee fusipon--which was done in mid September.  She is doing well with rehabilitation pain appears to be controlled with oxycodone she also has Flexeril as needed.  Her postop anemia appears to be improving with recent hemoglobin of 12.6 on lab done 06/06/2016 she is on iron.  She is also a type II diabetic she is on  Glucophage blood sugars in the morning appear to be fairly stable largely in the low 100s-later in the day blood sugars at at bedtime appear to be stable as well more in the lower mid 100s.  She is on a course of doxycycline for vaginitis thought to be recurrent she was treated with doxycycline in the hospital as well she says she's still having some drainage however and I suspect she will benefit from a GYN consult.  She also appear to have a parotid gland inflammation previously had has completed course of Augmentin and this is doing much better.  She is complaining of some right lower mouth tooth  pain says she has a couple teeth that she she says is scheduled to be pulled r but apparently being in rehabilitation she has not made it to the dentist-    Past Medical History:  Diagnosis Date  . Arthritis   . DOE (dyspnea on exertion)   . Eczema   . Glaucoma   . Infection    r knee  . Muscle spasm of back   . Positive ANA (antinuclear antibody)    1:180 homogenous pattern    Past Surgical History:  Procedure Laterality Date  . IRRIGATION AND DEBRIDEMENT KNEE Right 05/12/2013   Procedure: IRRIGATION AND DEBRIDEMENT KNEE;  Surgeon: Carole Civil, MD;  Location: [facility];  Service: Orthopedics;  Laterality: Right;  . JOINT REPLACEMENT     r knee  . LIPOMA EXCISION  11/16/2011   Procedure: EXCISION LIPOMA;  Surgeon:  Scherry Ran, MD;  Location: [facility];  Service: General;  Laterality: Left;  Excision of neoplasm left shoulder  . MOUTH SURGERY    . MULTIPLE EXTRACTIONS WITH ALVEOLOPLASTY  12/28/2011   Procedure: MULTIPLE EXTRACION WITH ALVEOLOPLASTY;  Surgeon: Gae Bon, DDS;  Location: [facility];  Service: Oral Surgery;  Laterality: Bilateral;  . PATELLAR TENDON REPAIR Right 05/12/2013   Procedure: PATELLA TENDON REPAIR AND ALLOGRAFT RECONSTRUCTION;  Surgeon: Carole Civil, MD;  Location: [facility];  Service: Orthopedics;  Laterality: Right;  . resection arthroplasty right total  knee  01/28/16   Dr Starlyn Skeans, [facility]; antibiotic spacer placed  . SHOULDER SURGERY Left   . TOTAL KNEE ARTHROPLASTY Right 04/17/2013   Procedure: RIGHT TOTAL KNEE ARTHROPLASTY;  Surgeon: Carole Civil, MD;  Location: [facility];  Service: Orthopedics;  Laterality: Right;  . TOTAL KNEE ARTHROPLASTY Right 05/12/2013   Procedure: POLY EXCHANGE;  Surgeon: Carole Civil, MD;  Location: [facility];  Service: Orthopedics;  Laterality: Right;  . TUBAL LIGATION     and burned per patient.       reports that she quit smoking about 4 months ago. Her smoking use included Cigarettes. She has a 0.10 pack-year smoking history. She has never used smokeless tobacco. She reports that she drinks alcohol. She reports that she does not use drugs. Social History   Social History  . Marital status: Single    Spouse name: N/A  . Number of children: N/A  . Years of education: 22   Occupational History  .  Not Employed   Social History Main Topics  . Smoking status: Former Smoker    Packs/day: 0.10    Years: 1.00    Types: Cigarettes    Quit date: 01/28/2016  . Smokeless tobacco: Never Used     Comment: Discontinued while at hospital and SNF as of 01/28/16 ; previously "socail smoker" age [age]. Never > 1/2 ppd  . Alcohol use 0.0 oz/week     Comment:  occasionally  . Drug use: No  . Sexual activity: Yes   Other Topics Concern  . Not on file   Social History Narrative  . No narrative on file   Functional Status Survey:    Allergies  Allergen Reactions  . Chocolate Hives  . Nystatin Anxiety    Pertinent  Health Maintenance Due  Topic Date Due  . INFLUENZA VACCINE  07/31/2016 (Originally 03/31/2016)  . FOOT EXAM  02/11/2017 (Originally 11/21/1971)  . URINE MICROALBUMIN  02/11/2017 (Originally 11/21/1971)  . COLONOSCOPY  02/19/2022 (Originally 11/21/2011)  . HEMOGLOBIN A1C  11/30/2016  . OPHTHALMOLOGY EXAM  04/08/2017  . MAMMOGRAM  11/24/2017  . PAP SMEAR  10/16/2018    Medications:     Medication List       Accurate as of 06/15/16 11:40 AM. Always use your most recent med list.          aspirin 325 MG tablet From 06/17/16 to 07/01/16 give 325 mg by mouth twice  Day. From 07/02/16 to 08/13/16 give by mouth 325 mg once a day   clobetasol cream 0.05 % Commonly known as:  TEMOVATE Apply 1 application topically 2 (two) times daily.   cyclobenzaprine 10 MG tablet Commonly known as:  FLEXERIL Take 10 mg by mouth 4 (four) times daily as needed for muscle spasms.   diphenhydrAMINE 25 mg capsule Commonly known as:  BENADRYL Take 25 mg by mouth as needed.   doxycycline 100 MG capsule Commonly known as:  VIBRAMYCIN Take 100 mg by mouth 2 (two) times daily.   enoxaparin 40 MG/0.4ML injection Commonly known as:  LOVENOX Inject 40 mg into the skin daily. Last dose on 06/16/16   ferrous sulfate 325 (65 FE) MG tablet Take 325 mg by mouth 2 (two) times daily.   megestrol 40 MG tablet Commonly known as:  MEGACE TAKE 1 TABLET BY MOUTH ONCE DAILY.   metFORMIN 500 MG tablet Commonly known as:  GLUCOPHAGE Take 500 mg by mouth 2 (two) times daily with a meal.   NON FORMULARY May apply ice to incision for pain and swelling relief. Ice to be on for 20 minutes then off for 20 minutes. Do not let ice come in direct contact with skin, place in towel and then apply to skin. ( AS NEEDED )   oxycodone 5 MG capsule Commonly known as:  OXY-IR Take 1 capsule (5 mg total) by mouth every 4 (four) hours as needed.   RISA-BID PROBIOTIC Tabs Give one tablet twice a day by mouth for 10 days stop date 06/15/16   sennosides-docusate sodium 8.6-50 MG tablet Commonly known as:  SENOKOT-S Take 2 tablets by mouth 2 (two) times daily.   travoprost (benzalkonium) 0.004 % ophthalmic solution Commonly known as:  TRAVATAN Place 1 drop into both eyes at bedtime.   zolpidem 10 MG tablet Commonly known as:  AMBIEN Take 1 tablet (10 mg total) by mouth at bedtime as needed for sleep.        Review of Systems  General does not complaining any fever or chills says she's doing well with therapy.  Skin does not complain of rashes or itching right knee surgical site she feels has some increased hardness-says it feels like bone in the scar tissue but does not really complain of increased pain.  Head ears eyes nose mouth and throat is complaining of right lower mouth discomfort tooth discomfort does not complain of visual changes or sore throat.  Respiratory is not complaining of shortness breath or cough.  Cardiac no chest pain.  GI does not complain of nausea vomiting diarrhea constipation or abdominal discomfort.  GU does have history of vaginitis that she still having some vaginal drainage.  Musculoskeletal is not complaining of joint pain status post right total knee revision and recent fusion-she is toe-touch weightbearing right lower extremity.  Neurologic is not complaining of dizziness headache or numbness.  And psychiatric is not complaining of overt anxiety or depression continues to be in good spirits   Vitals:   06/15/16 1132  BP: 123/80  Pulse: 84  Resp: 16  Temp: 98 F (36.7 C)  TempSrc: Oral   There is no height or weight on file to calculate BMI. Physical Exam   In general this is a pleasant middle-aged female in no distress sitting comfortably in a wheelchair.  Her skin is warm and dry-right knee surgical site does have well-healed crusting there is some area of firmness there is some slight warmth but this appears to be typical postop warmth and minimal erythema which again appears to be typical postop presentation.  Eyes pupils appear reactive light sclerae and conjunctivae are clear visual acuity appears grossly intact.  Oropharynx mucous membranes are moist,  Tongue  is midline right lower mouth there are couple teeth which showed some decay and chipping there is some tenderness to palpation of this area  I do not see any drainage or  bleeding  Chest is clear to auscultation there is no labored breathing.  Heart is regular rate and rhythm without murmur gallop or rub she does not really have significant lower extremity edema pedal pulse is intact on the right.  Abdomen is obese soft nontender with positive bowel sounds.  GU.-Does not appear to have overt suprapubic tenderness-vaginal  exam not performed secondary to patient being in wheelchair  Musculoskeletal is in wheelchair today again she is toe-touch weightbearing on the right lower extremity moves her other extremities at baseline with baseline strength.  Neurologic is grossly intact no lateralizing findings.  Psychiatric she is alert and oriented pleasant and appropriate     Labs reviewed: Basic Metabolic Panel:  Recent Labs  03/10/16 1000 05/25/16 0730 06/06/16 0600  NA 134* 139 138  K 3.8 3.7 4.0  CL 104 104 105  CO2 26 25 22   GLUCOSE 161* 99 160*  BUN 13 12 11   CREATININE 0.60 0.64 0.77  CALCIUM 8.6* 9.4 9.4   Liver Function Tests: No results for input(s): AST, ALT, ALKPHOS, BILITOT, PROT, ALBUMIN in the last 8760 hours. No results for input(s): LIPASE, AMYLASE in the last 8760 hours. No results for input(s): AMMONIA in the last 8760 hours. CBC:  Recent Labs  05/25/16 0730 06/01/16 0615 06/06/16 0600  WBC 12.7* 11.3* 11.3*  NEUTROABS 7.9* 7.1 7.2  HGB 9.1* 10.1* 12.6  HCT 28.2* 31.8* 38.4  MCV 83.4 85.3 85.7  PLT 484* 595* 512*   Cardiac Enzymes: No results for input(s): CKTOTAL, CKMB, CKMBINDEX, TROPONINI in the last 8760 hours. BNP: Invalid input(s): POCBNP CBG: No results for input(s): GLUCAP in the last 8760 hours.  Procedures and Imaging Studies During Stay: No results found.  Assessment/Plan:   #1 history of status post right knee fusion-at this point pain appears controlled on the oxycodone and Flexeril when necessary she is completing a course Lovenox for DVT prophylaxis she will be switched to an aspirin regiment  starting on October 18.  She is concerned somewhat about hardness at the surgical site will order an x-ray before discharge.  #2-history diabetes type 2 this appears fairly well controlled on Glucophage as noted above.  #3 postop anemia she is on iron hemoglobin appears to be improving most recently 12.6 on lab done 06/06/2016 follow up by primary care provider as warranted.  #4 history of vaginitis-she is on doxycycline apparently the drainage is persisting will order a GYN consult.  #5 history of right lower mouth-teeth pain-. Will order a dental consult-continue to monitor for changes she is on doxycycline at this point would not change antibiotics she is on doxycycline recently for vaginitis-this was discussed with Dr. Lyndel Safe  .   Patient is being discharged with the following home health services: PT and OT for strengthening with history again her right knee issues.        Patient has been advised to f/u with their PCP in 1-2 weeks to bring them up to date on their rehab stay.  Social services at facility was responsible for arranging this appointment.  Pt was provided with a 30 day supply of prescriptions for medications and refills must be obtained from their PCP.  For controlled substances, a more limited supply may be provided adequate until PCP appointment only.  [id number] note greater than 30 minutes spent on this discharge summary-greater than 50% of time spent coordinating plan of care for numerous diagnoses

## 2016-06-16 ENCOUNTER — Non-Acute Institutional Stay (SKILLED_NURSING_FACILITY): Payer: Medicare HMO | Admitting: Internal Medicine

## 2016-06-16 ENCOUNTER — Encounter: Payer: Self-pay | Admitting: Internal Medicine

## 2016-06-16 DIAGNOSIS — Z96651 Presence of right artificial knee joint: Secondary | ICD-10-CM | POA: Diagnosis not present

## 2016-06-16 NOTE — Progress Notes (Signed)
Location:   [facility] Room Number: 144/P Place of Service:  SNF [phone]) Provider:  Leroy Libman, MD  Patient Care Team: Lemmie Evens, MD as PCP - General [facility])  Extended Emergency Contact Information Primary Emergency Contact: Voght,Catherine Address: [address]          [place], [place] [address] Johnnette Litter of [place] Phone: [phone] Relation: Mother Secondary Emergency Contact: Ivy Lynn, [place] [address] Johnnette Litter of [place] Phone: [phone] Mobile Phone: [phone] Relation: Friend  Code Status:  Full Code Goals of care: Advanced Directive information Advanced Directives 06/16/2016  Does patient have an advance directive? Yes  Type of Advance Directive (No Data)  Does patient want to make changes to advanced directive? No - Patient declined  Copy of advanced directive(s) in chart? Yes  Would patient like information on creating an advanced directive? -  Pre-existing out of facility DNR order (yellow form or pink MOST form) -     Chief Complaint  Patient presents with  . Acute Visit    HPI:  Pt is a 55 y.o. female seen today for an acute visit for her Xray of knee. Patient was seen yesterday and complained of some sutures in her knee incision. But she had repeat Xray of her knee. The xray showed Questionable metaphysis of the knee close to Femoral concern of osteomyelitis or loosening needing further evaluation. Patient got very upset when I talked to her today. She said she is not having any pain or fever. No swelling of the knee. She is hoping with her other leg and not weight bearing in that knee. She just wants to go home and see his orthopedics as out patient.   Past Medical History:  Diagnosis Date  . Arthritis   . DOE (dyspnea on exertion)   . Eczema   . Glaucoma   . Infection    r knee  . Muscle spasm of back   . Positive ANA (antinuclear antibody)    1:180 homogenous pattern   Past Surgical History:  Procedure Laterality Date  . IRRIGATION AND DEBRIDEMENT KNEE Right 05/12/2013   Procedure: IRRIGATION AND DEBRIDEMENT KNEE;  Surgeon: Carole Civil, MD;  Location: [facility];  Service: Orthopedics;  Laterality: Right;  . JOINT REPLACEMENT     r knee  . LIPOMA EXCISION  11/16/2011   Procedure: EXCISION LIPOMA;  Surgeon: Scherry Ran, MD;  Location: [facility];  Service: General;  Laterality: Left;  Excision of neoplasm left shoulder  . MOUTH SURGERY    . MULTIPLE EXTRACTIONS WITH ALVEOLOPLASTY  12/28/2011   Procedure: MULTIPLE EXTRACION WITH ALVEOLOPLASTY;  Surgeon: Gae Bon, DDS;  Location: [facility];  Service: Oral Surgery;  Laterality: Bilateral;  . PATELLAR TENDON REPAIR Right 05/12/2013   Procedure: PATELLA TENDON REPAIR AND ALLOGRAFT RECONSTRUCTION;  Surgeon: Carole Civil, MD;  Location: [facility];  Service: Orthopedics;  Laterality: Right;  . resection arthroplasty right total knee  01/28/16   Dr Starlyn Skeans, [facility]; antibiotic spacer placed  . SHOULDER SURGERY Left   . TOTAL KNEE ARTHROPLASTY Right 04/17/2013   Procedure: RIGHT TOTAL KNEE ARTHROPLASTY;  Surgeon: Carole Civil, MD;  Location: [facility];  Service: Orthopedics;  Laterality: Right;  . TOTAL KNEE ARTHROPLASTY Right 05/12/2013   Procedure: POLY EXCHANGE;  Surgeon: Carole Civil, MD;  Location: [facility];  Service: Orthopedics;  Laterality: Right;  . TUBAL LIGATION  and burned per patient.     Allergies  Allergen Reactions  . Chocolate Hives  . Nystatin Anxiety      Medication List       Accurate as of 06/16/16  1:54 PM. Always use your most recent med list.          aspirin 325 MG tablet From 06/17/16 to 07/01/16 give 325 mg by mouth twice  Day. From 07/02/16 to 08/13/16 give by mouth 325 mg once a day   clobetasol cream 0.05 % Commonly known as:  TEMOVATE Apply 1 application topically 2 (two) times daily.   cyclobenzaprine 10 MG  tablet Commonly known as:  FLEXERIL Take 10 mg by mouth 4 (four) times daily as needed for muscle spasms.   diphenhydrAMINE 25 mg capsule Commonly known as:  BENADRYL Take 25 mg by mouth as needed.   doxycycline 100 MG capsule Commonly known as:  VIBRAMYCIN Take 100 mg by mouth 2 (two) times daily.   enoxaparin 40 MG/0.4ML injection Commonly known as:  LOVENOX Inject 40 mg into the skin daily. Last dose on 06/16/16   ferrous sulfate 325 (65 FE) MG tablet Take 325 mg by mouth 2 (two) times daily.   megestrol 40 MG tablet Commonly known as:  MEGACE TAKE 1 TABLET BY MOUTH ONCE DAILY.   metFORMIN 500 MG tablet Commonly known as:  GLUCOPHAGE Take 500 mg by mouth 2 (two) times daily with a meal.   NON FORMULARY May apply ice to incision for pain and swelling relief. Ice to be on for 20 minutes then off for 20 minutes. Do not let ice come in direct contact with skin, place in towel and then apply to skin. ( AS NEEDED )   oxycodone 5 MG capsule Commonly known as:  OXY-IR Take 1 capsule (5 mg total) by mouth every 4 (four) hours as needed.   sennosides-docusate sodium 8.6-50 MG tablet Commonly known as:  SENOKOT-S Take 2 tablets by mouth 2 (two) times daily.   travoprost (benzalkonium) 0.004 % ophthalmic solution Commonly known as:  TRAVATAN Place 1 drop into both eyes at bedtime.   triamcinolone cream 0.1 % Commonly known as:  KENALOG Apply to areas of dry skin once a day   zolpidem 10 MG tablet Commonly known as:  AMBIEN Take 1 tablet (10 mg total) by mouth at bedtime as needed for sleep.       Review of Systems  Constitutional: Negative for chills, fatigue and fever.  Cardiovascular: Negative for chest pain, palpitations and leg swelling.  Musculoskeletal: Negative for arthralgias, joint swelling and myalgias.    Immunization History  Administered Date(s) Administered  . PPD Test 02/20/2016   Pertinent  Health Maintenance Due  Topic Date Due  . INFLUENZA  VACCINE  07/31/2016 (Originally 03/31/2016)  . FOOT EXAM  02/11/2017 (Originally [birth year]/1971)  . URINE MICROALBUMIN  02/11/2017 (Originally 11/21/1970)  . COLONOSCOPY  02/19/2022 (Originally 11/21/2011)  . HEMOGLOBIN A1C  11/30/2016  . OPHTHALMOLOGY EXAM  04/08/2017  . MAMMOGRAM  11/24/2017  . PAP SMEAR  10/16/2018   No flowsheet data found. Functional Status Survey:    Vitals:   06/16/16 1345  BP: 130/82  Pulse: 100  Resp: (!) 22  Temp: 97 F (36.1 C)  TempSrc: Oral   There is no height or weight on file to calculate BMI. Physical Exam  Constitutional: She appears well-developed and well-nourished.  HENT:  Head: Normocephalic.  Musculoskeletal:  Incision is well healed with some sutures still there.No swelling.  Not tender.    Labs reviewed:  Recent Labs  03/10/16 1000 05/25/16 0730 06/06/16 0600  NA 134* 139 138  K 3.8 3.7 4.0  CL 104 104 105  CO2 26 25 22   GLUCOSE 161* 99 160*  BUN 13 12 11   CREATININE 0.60 0.64 0.77  CALCIUM 8.6* 9.4 9.4   No results for input(s): AST, ALT, ALKPHOS, BILITOT, PROT, ALBUMIN in the last 8760 hours.  Recent Labs  05/25/16 0730 06/01/16 0615 06/06/16 0600  WBC 12.7* 11.3* 11.3*  NEUTROABS 7.9* 7.1 7.2  HGB 9.1* 10.1* 12.6  HCT 28.2* 31.8* 38.4  MCV 83.4 85.3 85.7  PLT 484* 595* 512*   No results found for: TSH Lab Results  Component Value Date   HGBA1C 6.4 (H) 06/01/2016   No results found for: CHOL, HDL, LDLCALC, LDLDIRECT, TRIG, CHOLHDL  Significant Diagnostic Results in last 30 days:  No results found.  Assessment/Plan  Patient With Total knee replacement S/p right knee fusion  Clinically patient shows no signs of any infection. Patient doing well with therapy. Ready for discharge tomorrow. She says ' I don't want anyone to touch my knee anymore.' Will arrange for earlier follow up with her Orthopedics. Explained the plan to patient . She is agreable. Her films need to be compared with her other X  Rays.   Family/ staff Communication:   Labs/tests ordered:

## 2016-06-25 ENCOUNTER — Encounter: Payer: Self-pay | Admitting: Obstetrics and Gynecology

## 2016-06-25 ENCOUNTER — Ambulatory Visit (INDEPENDENT_AMBULATORY_CARE_PROVIDER_SITE_OTHER): Payer: Medicare HMO | Admitting: Obstetrics and Gynecology

## 2016-06-25 VITALS — BP 138/80 | HR 100 | Wt 233.6 lb

## 2016-06-25 DIAGNOSIS — N898 Other specified noninflammatory disorders of vagina: Secondary | ICD-10-CM

## 2016-06-25 DIAGNOSIS — A5901 Trichomonal vulvovaginitis: Secondary | ICD-10-CM | POA: Diagnosis not present

## 2016-06-25 MED ORDER — METRONIDAZOLE 500 MG PO TABS: 500.0000 mg | ORAL_TABLET | Freq: Two times a day (BID) | ORAL | 1 refills | Status: DC

## 2016-06-25 NOTE — Progress Notes (Addendum)
[facility] Visit  06/25/16           Patient name: [name] MRN [id number]  Date of birth: 1961/01/11  CC & HPI:  [name] is a 55 y.o. female presenting today for persistent vaginal irritation. She was last seen 05/12/16 for the same and was started doxycycline.  Examination difficult as pt is wheelchairbound and has had fusion of right knee, requiring a second assistant for exam, and redressing of pt ROS:  ROS +vaginal irritation Otherwise negative  Pertinent History Reviewed:   Reviewed: Significant for [facility] Medical         Past Medical History:  Diagnosis Date  . Arthritis   . DOE (dyspnea on exertion)   . Eczema   . Glaucoma   . Infection    r knee  . Muscle spasm of back   . Positive ANA (antinuclear antibody)    1:180 homogenous pattern                              Surgical Hx:    Past Surgical History:  Procedure Laterality Date  . IRRIGATION AND DEBRIDEMENT KNEE Right 05/12/2013   Procedure: IRRIGATION AND DEBRIDEMENT KNEE;  Surgeon: Carole Civil, MD;  Location: [facility];  Service: Orthopedics;  Laterality: Right;  . JOINT REPLACEMENT     r knee  . LIPOMA EXCISION  11/16/2011   Procedure: EXCISION LIPOMA;  Surgeon: Scherry Ran, MD;  Location: [facility];  Service: General;  Laterality: Left;  Excision of neoplasm left shoulder  . MOUTH SURGERY    . MULTIPLE EXTRACTIONS WITH ALVEOLOPLASTY  12/28/2011   Procedure: MULTIPLE EXTRACION WITH ALVEOLOPLASTY;  Surgeon: Gae Bon, DDS;  Location: [place];  Service: Oral Surgery;  Laterality: Bilateral;  . PATELLAR TENDON REPAIR Right 05/12/2013   Procedure: PATELLA TENDON REPAIR AND ALLOGRAFT RECONSTRUCTION;  Surgeon: Carole Civil, MD;  Location: [facility];  Service: Orthopedics;  Laterality: Right;  . resection arthroplasty right total knee  01/28/16   Dr Starlyn Skeans, [facility]; antibiotic spacer placed  . SHOULDER SURGERY Left   . TOTAL KNEE ARTHROPLASTY Right 04/17/2013   Procedure:  RIGHT TOTAL KNEE ARTHROPLASTY;  Surgeon: Carole Civil, MD;  Location: [facility];  Service: Orthopedics;  Laterality: Right;  . TOTAL KNEE ARTHROPLASTY Right 05/12/2013   Procedure: POLY EXCHANGE;  Surgeon: Carole Civil, MD;  Location: [facility];  Service: Orthopedics;  Laterality: Right;  . TUBAL LIGATION     and burned per patient.    Medications: Reviewed & Updated - see associated section                       Current Outpatient Prescriptions:  .  aspirin 325 MG tablet, From 06/17/16 to 07/01/16 give 325 mg by mouth twice  Day. From 07/02/16 to 08/13/16 give by mouth 325 mg once a day, Disp: , Rfl:  .  clobetasol cream (TEMOVATE) AB-123456789 %, Apply 1 application topically 2 (two) times daily., Disp: , Rfl:  .  cyclobenzaprine (FLEXERIL) 10 MG tablet, Take 10 mg by mouth 4 (four) times daily as needed for muscle spasms., Disp: , Rfl:  .  diphenhydrAMINE (BENADRYL) 25 mg capsule, Take 25 mg by mouth as needed., Disp: , Rfl:  .  ferrous sulfate 325 (65 FE) MG tablet, Take 325 mg by mouth 2 (two) times daily., Disp: , Rfl:  .  megestrol (  MEGACE) 40 MG tablet, TAKE 1 TABLET BY MOUTH ONCE DAILY., Disp: 30 tablet, Rfl: 11 .  metFORMIN (GLUCOPHAGE) 500 MG tablet, Take 500 mg by mouth 2 (two) times daily with a meal., Disp: , Rfl:  .  oxycodone (OXY-IR) 5 MG capsule, Take 1 capsule (5 mg total) by mouth every 4 (four) hours as needed., Disp: 180 capsule, Rfl: 0 .  sennosides-docusate sodium (SENOKOT-S) 8.6-50 MG tablet, Take 2 tablets by mouth 2 (two) times daily., Disp: , Rfl:  .  travoprost, benzalkonium, (TRAVATAN) 0.004 % ophthalmic solution, Place 1 drop into both eyes at bedtime. , Disp: , Rfl:  .  triamcinolone cream (KENALOG) 0.1 %, Apply to areas of dry skin once a day, Disp: , Rfl:  .  zolpidem (AMBIEN) 10 MG tablet, Take 1 tablet (10 mg total) by mouth at bedtime as needed for sleep., Disp: 30 tablet, Rfl: 0 .  doxycycline (VIBRAMYCIN) 100 MG capsule, Take 100 mg by mouth 2 (two) times  daily., Disp: , Rfl:  .  enoxaparin (LOVENOX) 40 MG/0.4ML injection, Inject 40 mg into the skin daily. Last dose on 06/16/16, Disp: , Rfl:  .  NON FORMULARY, May apply ice to incision for pain and swelling relief. Ice to be on for 20 minutes then off for 20 minutes. Do not let ice come in direct contact with skin, place in towel and then apply to skin. ( AS NEEDED ), Disp: , Rfl:    Social History: Reviewed -  reports that she quit smoking about 4 months ago. Her smoking use included Cigarettes. She has a 0.10 pack-year smoking history. She has never used smokeless tobacco.  Objective Findings:  Vitals: Blood pressure 138/80, pulse 100, weight 233 lb 9.6 oz (106 kg), last menstrual period 01/15/2012.  Physical Examination: General appearance - alert, well appearing, and in no distress right leg fused at knee. Mental status - alert, oriented to person, place, and time Pelvic -  VULVA: normal appearing vulva with no masses, tenderness or lesions,  VAGINA: normal appearing vagina with normal color and discharge, no lesions,  CERVIX: strawberry cervix without discharge or lesions,    Wet Prep: heavy trich, multiple white cells  Assessment & Plan:   A:  1. Trichomonas vaginitis  2.    and leukorrhea   P:  1. Metronidazole x 14 days with 1 refill, pt to have older partner tx'd    By signing my name below, I, Sonum Patel, attest that this documentation has been prepared under the direction and in the presence of Jonnie Kind, MD. Electronically Signed: Sonum Patel, Education administrator. 06/25/16. 12:26 PM.  I personally performed the services described in this documentation, which was SCRIBED in my presence. The recorded information has been reviewed and considered accurate. It has been edited as necessary during review. Jonnie Kind, MD

## 2016-06-28 NOTE — Progress Notes (Signed)
This encounter was created in error - please disregard.

## 2016-07-01 ENCOUNTER — Other Ambulatory Visit: Payer: Self-pay | Admitting: Obstetrics and Gynecology

## 2016-07-27 ENCOUNTER — Encounter: Payer: Self-pay | Admitting: Obstetrics and Gynecology

## 2016-07-27 ENCOUNTER — Ambulatory Visit (INDEPENDENT_AMBULATORY_CARE_PROVIDER_SITE_OTHER): Payer: Medicare HMO | Admitting: Obstetrics and Gynecology

## 2016-07-27 VITALS — BP 130/60 | HR 120 | Wt 234.8 lb

## 2016-07-27 DIAGNOSIS — N898 Other specified noninflammatory disorders of vagina: Secondary | ICD-10-CM | POA: Diagnosis not present

## 2016-07-27 DIAGNOSIS — A5901 Trichomonal vulvovaginitis: Secondary | ICD-10-CM | POA: Diagnosis not present

## 2016-07-27 NOTE — Progress Notes (Signed)
[facility] Visit  07/27/16            Patient name: [name] MRN [id number]  Date of birth: 06-11-1961  CC & HPI:  [name] is a 55 y.o. female presenting today for proof of care. Patient was last seen on 06/25/16 and was started on metronidazole for trichomonas vaginitis and leukorrhea. She states she took the refill as well. She denies any symptoms at this time.  She is no longer sexually active. She informed former partner  ROS:  ROS No complaints at this time  Pertinent History Reviewed:   Reviewed: Significant for tubal ligation Medical         Past Medical History:  Diagnosis Date  . Arthritis   . DOE (dyspnea on exertion)   . Eczema   . Glaucoma   . Infection    r knee  . Muscle spasm of back   . Positive ANA (antinuclear antibody)    1:180 homogenous pattern                              Surgical Hx:    Past Surgical History:  Procedure Laterality Date  . IRRIGATION AND DEBRIDEMENT KNEE Right 05/12/2013   Procedure: IRRIGATION AND DEBRIDEMENT KNEE;  Surgeon: Carole Civil, MD;  Location: [facility];  Service: Orthopedics;  Laterality: Right;  . JOINT REPLACEMENT     r knee  . LIPOMA EXCISION  11/16/2011   Procedure: EXCISION LIPOMA;  Surgeon: Scherry Ran, MD;  Location: [facility];  Service: General;  Laterality: Left;  Excision of neoplasm left shoulder  . MOUTH SURGERY    . MULTIPLE EXTRACTIONS WITH ALVEOLOPLASTY  12/28/2011   Procedure: MULTIPLE EXTRACION WITH ALVEOLOPLASTY;  Surgeon: Gae Bon, DDS;  Location: [place];  Service: Oral Surgery;  Laterality: Bilateral;  . PATELLAR TENDON REPAIR Right 05/12/2013   Procedure: PATELLA TENDON REPAIR AND ALLOGRAFT RECONSTRUCTION;  Surgeon: Carole Civil, MD;  Location: [facility];  Service: Orthopedics;  Laterality: Right;  . resection arthroplasty right total knee  01/28/16   Dr Starlyn Skeans, [facility]; antibiotic spacer placed  . SHOULDER SURGERY Left   . TOTAL KNEE ARTHROPLASTY Right  04/17/2013   Procedure: RIGHT TOTAL KNEE ARTHROPLASTY;  Surgeon: Carole Civil, MD;  Location[facility];  Service: Orthopedics;  Laterality: Right;  . TOTAL KNEE ARTHROPLASTY Right 05/12/2013   Procedure: POLY EXCHANGE;  Surgeon: Carole Civil, MD;  Location: [facility];  Service: Orthopedics;  Laterality: Right;  . TUBAL LIGATION     and burned per patient.    Medications: Reviewed & Updated - see associated section                       Current Outpatient Prescriptions:  .  aspirin 325 MG tablet, From 06/17/16 to 07/01/16 give 325 mg by mouth twice  Day. From 07/02/16 to 08/13/16 give by mouth 325 mg once a day, Disp: , Rfl:  .  clobetasol cream (TEMOVATE) [id number] %, Apply 1 application topically 2 (two) times daily., Disp: , Rfl:  .  cyclobenzaprine (FLEXERIL) 10 MG tablet, Take 10 mg by mouth 4 (four) times daily as needed for muscle spasms., Disp: , Rfl:  .  diphenhydrAMINE (BENADRYL) 25 mg capsule, Take 25 mg by mouth as needed., Disp: , Rfl:  .  enoxaparin (LOVENOX) 40 MG/0.4ML injection, Inject 40 mg into the skin daily. Last  dose on 06/16/16, Disp: , Rfl:  .  ferrous sulfate 325 (65 FE) MG tablet, Take 325 mg by mouth 2 (two) times daily., Disp: , Rfl:  .  megestrol (MEGACE) 40 MG tablet, TAKE 1 TABLET BY MOUTH ONCE DAILY., Disp: 30 tablet, Rfl: 11 .  metFORMIN (GLUCOPHAGE) 500 MG tablet, Take 500 mg by mouth 2 (two) times daily with a meal., Disp: , Rfl:  .  NON FORMULARY, May apply ice to incision for pain and swelling relief. Ice to be on for 20 minutes then off for 20 minutes. Do not let ice come in direct contact with skin, place in towel and then apply to skin. ( AS NEEDED ), Disp: , Rfl:  .  oxycodone (OXY-IR) 5 MG capsule, Take 1 capsule (5 mg total) by mouth every 4 (four) hours as needed., Disp: 180 capsule, Rfl: 0 .  sennosides-docusate sodium (SENOKOT-S) 8.6-50 MG tablet, Take 2 tablets by mouth 2 (two) times daily., Disp: , Rfl:  .  travoprost, benzalkonium, (TRAVATAN) 0.004 %  ophthalmic solution, Place 1 drop into both eyes at bedtime. , Disp: , Rfl:  .  triamcinolone cream (KENALOG) 0.1 %, Apply to areas of dry skin once a day, Disp: , Rfl:  .  zolpidem (AMBIEN) 10 MG tablet, Take 1 tablet (10 mg total) by mouth at bedtime as needed for sleep., Disp: 30 tablet, Rfl: 0   Social History: Reviewed -  reports that she quit smoking about 5 months ago. Her smoking use included Cigarettes. She has a 0.10 pack-year smoking history. She has never used smokeless tobacco.  Objective Findings:  Vitals: Blood pressure 130/60, pulse (!) 120, weight 234 lb 12.8 oz (106.5 kg), last menstrual period 01/15/2012. Exam challenging due to rt knee removal s/p infected knee replacement Physical Examination: General appearance - alert, well appearing, and in no distress Pelvic - VULVA: normal appearing vulva with no masses, tenderness or lesions,  VAGINA: normal appearing vagina with normal color and minimal discharge, no lesions,  CERVIX: normal appearing cervix without discharge or lesions  Wet Prep: negative for trich, epithelial cells only   Assessment & Plan:   A:  1. Resolved trichomonas vaginitis  2 s.p rt knee removal and knee fusion. P:  1. Follow up PRN    By signing my name below, I, Sonum Patel, attest that this documentation has been prepared under the direction and in the presence of Jonnie Kind, MD. Electronically Signed: Sonum Patel, Education administrator. 07/27/16. 11:44 AM.  I personally performed the services described in this documentation, which was SCRIBED in my presence. The recorded information has been reviewed and considered accurate. It has been edited as necessary during review. Jonnie Kind, MD

## 2016-07-28 NOTE — Patient Instructions (Signed)
trTrichomonas Test The trichomonas test is done to diagnose trichomoniasis, an infection caused by an organism called Trichomonas. Trichomoniasis is a sexually transmitted infection (STI). In women, it causes vaginal infections. In men, it can cause the tube that carries urine (urethra) to become inflamed (urethritis). You may have this test as a part of a routine screening for STIs or if you have symptoms of trichomoniasis. To perform the test, your health care provider will take a sample of discharge. The sample is taken from the vagina or cervix in women and from the urethra in men. A urine sample can also be used for testing. RESULTS It is your responsibility to obtain your test results. Ask the lab or department performing the test when and how you will get your results. Contact your health care provider to discuss any questions you have about your results.  Meaning of Negative Test Results A negative test means you do not have trichomoniasis. Follow your health care provider's directions about any follow-up testing.  Meaning of Positive Test Results A positive test result means you have an active infection that needs to be treated with antibiotic medicine. All your current sexual partners must also be treated or it is likely you will get reinfected.  If your test is positive, your health care provider will start you on medicine and may advise you to:  Not have sexual intercourse until your infection has cleared up.  Use a latex condom properly every time you have sexual intercourse.  Limit the number of sexual partners you have. The more partners you have, the greater your risk of contracting trichomoniasis or another STI.  Tell all sexual partners about your infection so they can also be treated and to prevent reinfection. This information is not intended to replace advice given to you by your health care provider. Make sure you discuss any questions you have with your health care  provider. Document Released: 09/19/2004 Document Revised: 09/07/2014 Document Reviewed: 08/29/2013 Elsevier Interactive Patient Education  2017 [employer].

## 2016-09-01 ENCOUNTER — Other Ambulatory Visit: Payer: Self-pay | Admitting: Obstetrics & Gynecology

## 2017-01-01 ENCOUNTER — Other Ambulatory Visit: Payer: Self-pay | Admitting: Obstetrics & Gynecology

## 2017-01-01 DIAGNOSIS — Z1231 Encounter for screening mammogram for malignant neoplasm of breast: Secondary | ICD-10-CM

## 2017-01-12 ENCOUNTER — Encounter: Payer: Self-pay | Admitting: Obstetrics & Gynecology

## 2017-01-12 ENCOUNTER — Other Ambulatory Visit (HOSPITAL_COMMUNITY)
Admission: RE | Admit: 2017-01-12 | Discharge: 2017-01-12 | Disposition: A | Discharge status: Home / Self Care | Payer: Medicare HMO | Source: Ambulatory Visit | Attending: Obstetrics & Gynecology | Admitting: Obstetrics & Gynecology

## 2017-01-12 ENCOUNTER — Ambulatory Visit (INDEPENDENT_AMBULATORY_CARE_PROVIDER_SITE_OTHER): Payer: Medicare HMO | Admitting: Obstetrics & Gynecology

## 2017-01-12 ENCOUNTER — Encounter (INDEPENDENT_AMBULATORY_CARE_PROVIDER_SITE_OTHER): Payer: Self-pay

## 2017-01-12 VITALS — BP 124/82 | HR 86 | Ht 62.0 in | Wt 246.0 lb

## 2017-01-12 DIAGNOSIS — Z01419 Encounter for gynecological examination (general) (routine) without abnormal findings: Secondary | ICD-10-CM | POA: Diagnosis not present

## 2017-01-12 DIAGNOSIS — Z1211 Encounter for screening for malignant neoplasm of colon: Secondary | ICD-10-CM | POA: Diagnosis not present

## 2017-01-12 DIAGNOSIS — Z1212 Encounter for screening for malignant neoplasm of rectum: Secondary | ICD-10-CM

## 2017-01-12 NOTE — Progress Notes (Signed)
Subjective:     [name] is a 56 y.o. female here for a routine exam.  Patient's last menstrual period was 01/15/2012. [id number] Birth Control Method:  Bilateral tubal ligation Menstrual Calendar(currently): amenorrheic on megace for bleeding control  Current complaints: none.   Current acute medical issues:  Knee and arthritis   Recent Gynecologic History Patient's last menstrual period was 01/15/2012. Last Pap: 2017,  normal Last mammogram: 2017,  normal  Past Medical History:  Diagnosis Date  . Arthritis   . DOE (dyspnea on exertion)   . Eczema   . Glaucoma   . Infection    r knee  . Muscle spasm of back   . Positive ANA (antinuclear antibody)    1:180 homogenous pattern    Past Surgical History:  Procedure Laterality Date  . IRRIGATION AND DEBRIDEMENT KNEE Right 05/12/2013   Procedure: IRRIGATION AND DEBRIDEMENT KNEE;  Surgeon: Carole Civil, MD;  Location: [facility];  Service: Orthopedics;  Laterality: Right;  . JOINT REPLACEMENT     r knee  . LIPOMA EXCISION  11/16/2011   Procedure: EXCISION LIPOMA;  Surgeon: Scherry Ran, MD;  Location: [facility];  Service: General;  Laterality: Left;  Excision of neoplasm left shoulder  . MOUTH SURGERY    . MULTIPLE EXTRACTIONS WITH ALVEOLOPLASTY  12/28/2011   Procedure: MULTIPLE EXTRACION WITH ALVEOLOPLASTY;  Surgeon: Gae Bon, DDS;  Location: [place];  Service: Oral Surgery;  Laterality: Bilateral;  . PATELLAR TENDON REPAIR Right 05/12/2013   Procedure: PATELLA TENDON REPAIR AND ALLOGRAFT RECONSTRUCTION;  Surgeon: Carole Civil, MD;  Location: [facility];  Service: Orthopedics;  Laterality: Right;  . resection arthroplasty right total knee  01/28/16   Dr Starlyn Skeans, [facility]; antibiotic spacer placed  . SHOULDER SURGERY Left   . TOTAL KNEE ARTHROPLASTY Right 04/17/2013   Procedure: RIGHT TOTAL KNEE ARTHROPLASTY;  Surgeon: Carole Civil, MD;  Location: [facility];  Service: Orthopedics;  Laterality: Right;  . TOTAL  KNEE ARTHROPLASTY Right 05/12/2013   Procedure: POLY EXCHANGE;  Surgeon: Carole Civil, MD;  Location: [facility];  Service: Orthopedics;  Laterality: Right;  . TUBAL LIGATION     and burned per patient.     OB History    Gravida Para Term Preterm AB Living   5 5 5     6    SAB TAB Ectopic Multiple Live Births         1        Social History   Social History  . Marital status: Single    Spouse name: N/A  . Number of children: N/A  . Years of education: 84   Occupational History  .  Not Employed   Social History Main Topics  . Smoking status: Former Smoker    Packs/day: 0.10    Years: 1.00    Types: Cigarettes    Quit date: 01/28/2016  . Smokeless tobacco: Never Used     Comment: Discontinued while at hospital and SNF as of 01/28/16 ; previously "socail smoker" age [age]. Never > 1/2 ppd  . Alcohol use 0.0 oz/week     Comment:  occasionally  . Drug use: No  . Sexual activity: Not Currently    Birth control/ protection: None   Other Topics Concern  . None   Social History Narrative  . None    Family History  Problem Relation Age of Onset  . Diabetes Brother   . Diabetes Mother   . Multiple sclerosis Sister   .  Arthritis Neg Hx   . Anesthesia problems Neg Hx   . Hypotension Neg Hx   . Malignant hyperthermia Neg Hx   . Pseudochol deficiency Neg Hx   . Cancer Neg Hx   . Heart disease Neg Hx   . Stroke Neg Hx      Current Outpatient Prescriptions:  .  aspirin 325 MG tablet, From 06/17/16 to 07/01/16 give 325 mg by mouth twice  Day. From 07/02/16 to 08/13/16 give by mouth 325 mg once a day, Disp: , Rfl:  .  atorvastatin (LIPITOR) 20 MG tablet, Take 20 mg by mouth daily., Disp: , Rfl:  .  clobetasol cream (TEMOVATE) 3.15 %, Apply 1 application topically 2 (two) times daily., Disp: , Rfl:  .  cyclobenzaprine (FLEXERIL) 10 MG tablet, Take 10 mg by mouth daily. , Disp: , Rfl:  .  diphenhydrAMINE (BENADRYL) 25 mg capsule, Take 25 mg by mouth as needed., Disp: , Rfl:   .  ferrous sulfate 325 (65 FE) MG tablet, Take 325 mg by mouth 2 (two) times daily., Disp: , Rfl:  .  lisinopril (PRINIVIL,ZESTRIL) 5 MG tablet, Take 5 mg by mouth daily., Disp: , Rfl:  .  megestrol (MEGACE) 40 MG tablet, TAKE 1 TABLET BY MOUTH ONCE DAILY., Disp: 30 tablet, Rfl: 11 .  metFORMIN (GLUCOPHAGE) 500 MG tablet, Take 500 mg by mouth 2 (two) times daily with a meal., Disp: , Rfl:  .  sennosides-docusate sodium (SENOKOT-S) 8.6-50 MG tablet, Take 2 tablets by mouth 2 (two) times daily., Disp: , Rfl:  .  travoprost, benzalkonium, (TRAVATAN) 0.004 % ophthalmic solution, Place 1 drop into both eyes at bedtime. , Disp: , Rfl:  .  triamcinolone cream (KENALOG) 0.1 %, Apply to areas of dry skin once a day, Disp: , Rfl:  .  zolpidem (AMBIEN) 10 MG tablet, Take 1 tablet (10 mg total) by mouth at bedtime as needed for sleep., Disp: 30 tablet, Rfl: 0  Review of Systems  Review of Systems  Constitutional: Negative for fever, chills, weight loss, malaise/fatigue and diaphoresis.  HENT: Negative for hearing loss, ear pain, nosebleeds, congestion, sore throat, neck pain, tinnitus and ear discharge.   Eyes: Negative for blurred vision, double vision, photophobia, pain, discharge and redness.  Respiratory: Negative for cough, hemoptysis, sputum production, shortness of breath, wheezing and stridor.   Cardiovascular: Negative for chest pain, palpitations, orthopnea, claudication, leg swelling and PND.  Gastrointestinal: negative for abdominal pain. Negative for heartburn, nausea, vomiting, diarrhea, constipation, blood in stool and melena.  Genitourinary: Negative for dysuria, urgency, frequency, hematuria and flank pain.  Musculoskeletal: Negative for myalgias, back pain, joint pain and falls.  Skin: Negative for itching and rash.  Neurological: Negative for dizziness, tingling, tremors, sensory change, speech change, focal weakness, seizures, loss of consciousness, weakness and headaches.   Endo/Heme/Allergies: Negative for environmental allergies and polydipsia. Does not bruise/bleed easily.  Psychiatric/Behavioral: Negative for depression, suicidal ideas, hallucinations, memory loss and substance abuse. The patient is not nervous/anxious and does not have insomnia.        Objective:  Blood pressure 124/82, pulse 86, height 5\' 2"  (1.575 m), weight 246 lb (111.6 kg), last menstrual period 01/15/2012.   Physical Exam  Vitals reviewed. Constitutional: She is oriented to person, place, and time. She appears well-developed and well-nourished.  HENT:  Head: Normocephalic and atraumatic.        Right Ear: External ear normal.  Left Ear: External ear normal.  Nose: Nose normal.  Mouth/Throat: Oropharynx is clear  and moist.  Eyes: Conjunctivae and EOM are normal. Pupils are equal, round, and reactive to light. Right eye exhibits no discharge. Left eye exhibits no discharge. No scleral icterus.  Neck: Normal range of motion. Neck supple. No tracheal deviation present. No thyromegaly present.  Cardiovascular: Normal rate, regular rhythm, normal heart sounds and intact distal pulses.  Exam reveals no gallop and no friction rub.   No murmur heard. Respiratory: Effort normal and breath sounds normal. No respiratory distress. She has no wheezes. She has no rales. She exhibits no tenderness.  GI: Soft. Bowel sounds are normal. She exhibits no distension and no mass. There is no tenderness. There is no rebound and no guarding.  Genitourinary:  Breasts no masses skin changes or nipple changes bilaterally      Vulva is normal without lesions Vagina is pink moist without discharge Cervix normal in appearance and pap is done Uterus is normal size shape and contour Adnexa is negative with normal sized ovaries  {Rectal    hemoccult negative, normal tone, no masses  Musculoskeletal: Normal range of motion. She exhibits no edema and no tenderness.  Neurological: She is alert and oriented to  person, place, and time. She has normal reflexes. She displays normal reflexes. No cranial nerve deficit. She exhibits normal muscle tone. Coordination normal.  Skin: Skin is warm and dry. No rash noted. No erythema. No pallor.  Psychiatric: She has a normal mood and affect. Her behavior is normal. Judgment and thought content normal.       Medications Ordered at today's visit: Meds ordered this encounter  Medications  . lisinopril (PRINIVIL,ZESTRIL) 5 MG tablet    Sig: Take 5 mg by mouth daily.  Marland Kitchen atorvastatin (LIPITOR) 20 MG tablet    Sig: Take 20 mg by mouth daily.    Other orders placed at today's visit: No orders of the defined types were placed in this encounter.     Assessment:    Healthy female exam.    Plan:    Mammogram ordered. Follow up in: 1 year.     Return in about 1 year (around 01/12/2018) for yearly, with Dr Elonda Husky.

## 2017-01-18 ENCOUNTER — Ambulatory Visit (HOSPITAL_COMMUNITY)
Admission: RE | Admit: 2017-01-18 | Discharge: 2017-01-18 | Disposition: A | Discharge status: Home / Self Care | Payer: Medicare HMO | Source: Ambulatory Visit | Attending: Obstetrics & Gynecology | Admitting: Obstetrics & Gynecology

## 2017-01-18 DIAGNOSIS — Z1231 Encounter for screening mammogram for malignant neoplasm of breast: Secondary | ICD-10-CM | POA: Insufficient documentation

## 2017-01-18 LAB — CYTOLOGY - PAP
Diagnosis: NEGATIVE
HPV: NOT DETECTED

## 2017-05-06 ENCOUNTER — Telehealth: Payer: Self-pay

## 2017-05-06 NOTE — Telephone Encounter (Signed)
[phone]  [phone]  PATIENT RECEIVED LETTER TO SCHEDULE TCS

## 2017-05-13 NOTE — Telephone Encounter (Signed)
Gastroenterology Pre-Procedure Review  Request Date: Requesting Physician: FANTA  FIRST TCS  PATIENT REVIEW QUESTIONS: The patient responded to the following health history questions as indicated:    1. Diabetes Melitis: YES 2. Joint replacements in the past 12 months: KNEE SURGERY LAST YEAR 3. Major health problems in the past 3 months: NO 4. Has an artificial valve or MVP: NO 5. Has a defibrillator: NO 6. Has been advised in past to take antibiotics in advance of a procedure like teeth cleaning: NO 7. Family history of colon cancer: NO 8. Alcohol Use: NO 9. History of sleep apnea: NO 10. History of coronary artery or other vascular stents placed within the last 12 months: NO 11. History of any prior anesthesia complications: NO    MEDICATIONS & ALLERGIES:    Patient reports the following regarding taking any blood thinners:   Plavix? NO Aspirin? YES  Coumadin? NO Brilinta? NO Xarelto? NO Eliquis? NO Pradaxa? NO Savaysa? NO Effient? NO  Patient confirms/reports the following medications:  Current Outpatient Prescriptions  Medication Sig Dispense Refill  . amoxicillin (AMOXIL) 500 MG tablet Take 500 mg by mouth 2 (two) times daily.    Marland Kitchen aspirin 325 MG tablet From 06/17/16 to 07/01/16 give 325 mg by mouth twice  Day. From 07/02/16 to 08/13/16 give by mouth 325 mg once a day    . atorvastatin (LIPITOR) 20 MG tablet Take 20 mg by mouth daily.    . cyclobenzaprine (FLEXERIL) 10 MG tablet Take 10 mg by mouth daily.     . diphenhydrAMINE (BENADRYL) 25 mg capsule Take 25 mg by mouth as needed.    . ferrous sulfate 325 (65 FE) MG tablet Take 325 mg by mouth 2 (two) times daily.    Marland Kitchen lisinopril (PRINIVIL,ZESTRIL) 5 MG tablet Take 5 mg by mouth daily.    . megestrol (MEGACE) 40 MG tablet TAKE 1 TABLET BY MOUTH ONCE DAILY. 30 tablet 11  . metFORMIN (GLUCOPHAGE) 500 MG tablet Take 500 mg by mouth 2 (two) times daily with a meal.    . sennosides-docusate sodium (SENOKOT-S) 8.6-50 MG  tablet Take 2 tablets by mouth 2 (two) times daily.    . travoprost, benzalkonium, (TRAVATAN) 0.004 % ophthalmic solution Place 1 drop into both eyes at bedtime.     . triamcinolone cream (KENALOG) 0.1 % Apply to areas of dry skin once a day    . zolpidem (AMBIEN) 10 MG tablet Take 1 tablet (10 mg total) by mouth at bedtime as needed for sleep. 30 tablet 0  . clobetasol cream (TEMOVATE) 1.95 % Apply 1 application topically 2 (two) times daily.     No current facility-administered medications for this visit.     Patient confirms/reports the following allergies:  Allergies  Allergen Reactions  . Chocolate Hives  . Nystatin Anxiety    No orders of the defined types were placed in this encounter.   AUTHORIZATION INFORMATION Primary Insurance: MEDICAID,  ID #: [id number],  Group #:  Pre-Cert / Auth required:  Pre-Cert / Auth #:   Secondary Insurance: Mcarthur Rossetti,  ID #: [id number],  Group #:  Pre-Cert / Auth required:  Pre-Cert / Auth #:   SCHEDULE INFORMATION: Procedure has been scheduled as follows:  Date: , Time:   Location:   This Gastroenterology Pre-Precedure Review Form is being routed to the following provider(s):

## 2017-05-15 NOTE — Telephone Encounter (Signed)
Can you ask her how often she uses Ambien?

## 2017-05-17 NOTE — Telephone Encounter (Signed)
Tried to call with no answer  

## 2017-05-24 NOTE — Telephone Encounter (Signed)
Patient states that she take the Ambien every night before bed.

## 2017-06-03 NOTE — Telephone Encounter (Signed)
OK to schedule with 25 mg preprocedure Phenergan Sent to [facility] in [place] absence

## 2017-06-14 ENCOUNTER — Other Ambulatory Visit: Payer: Self-pay

## 2017-06-14 DIAGNOSIS — Z1211 Encounter for screening for malignant neoplasm of colon: Secondary | ICD-10-CM

## 2017-06-14 NOTE — Telephone Encounter (Signed)
Pt is scheduled for 07/26/2017 at 9:45 AM with Dr. Oneida Alar.

## 2017-06-14 NOTE — Telephone Encounter (Signed)
Randall Hiss, please advise about diabetic meds.

## 2017-06-16 NOTE — Telephone Encounter (Signed)
Half dose night before, none the morning of.

## 2017-06-17 MED ORDER — PEG 3350-KCL-NA BICARB-NACL 420 G PO SOLR: 4000.0000 mL | ORAL | 0 refills | Status: DC

## 2017-06-17 NOTE — Addendum Note (Signed)
Addended by: Everardo All on: 06/17/2017 10:07 AM   Modules accepted: Orders

## 2017-06-17 NOTE — Telephone Encounter (Signed)
Rx sent to the pharmacy and instructions mailed to pt.  

## 2017-07-26 ENCOUNTER — Encounter (HOSPITAL_COMMUNITY): Admission: RE | Payer: Self-pay | Source: Ambulatory Visit

## 2017-07-26 ENCOUNTER — Ambulatory Visit (HOSPITAL_COMMUNITY): Admission: RE | Admit: 2017-07-26 | Payer: Medicare HMO | Source: Ambulatory Visit | Admitting: Gastroenterology

## 2017-07-26 SURGERY — COLONOSCOPY
Anesthesia: Moderate Sedation

## 2017-08-19 ENCOUNTER — Encounter: Payer: Self-pay | Admitting: Women's Health

## 2017-08-19 ENCOUNTER — Ambulatory Visit (INDEPENDENT_AMBULATORY_CARE_PROVIDER_SITE_OTHER): Payer: Medicare HMO | Admitting: Women's Health

## 2017-08-19 VITALS — BP 126/78 | HR 68 | Ht 62.0 in | Wt 254.0 lb

## 2017-08-19 DIAGNOSIS — N898 Other specified noninflammatory disorders of vagina: Secondary | ICD-10-CM

## 2017-08-19 DIAGNOSIS — A599 Trichomoniasis, unspecified: Secondary | ICD-10-CM

## 2017-08-19 LAB — POCT WET PREP (WET MOUNT): CLUE CELLS WET PREP WHIFF POC: POSITIVE

## 2017-08-19 MED ORDER — METRONIDAZOLE 500 MG PO TABS: 500.0000 mg | ORAL_TABLET | Freq: Two times a day (BID) | ORAL | 0 refills | Status: DC

## 2017-08-19 NOTE — Progress Notes (Signed)
   GYN VISIT Patient name: [name] MRN [id number]  Date of birth: 01-Sep-1960 Chief Complaint:   Vaginal Discharge (green )  History of Present Illness:   [name] is a 56 y.o. [id number] African American female being seen today for report of green malodorous vag d/c since last Friday. Has h/o trichomonas, feels it may be same. Thinks boyfriend could be cheating or just not taking his medicine to clear infection. Denies itching/irritation.      Patient's last menstrual period was 01/15/2012. The current method of family planning is tubal ligation. Last pap 01/12/17. Results were:  neg w/ -HRHPV Review of Systems:   Pertinent items are noted in HPI Denies fever/chills, dizziness, headaches, visual disturbances, fatigue, shortness of breath, chest pain, abdominal pain, vomiting, abnormal vaginal discharge/itching/odor/irritation, problems with periods, bowel movements, urination, or intercourse unless otherwise stated above.  Pertinent History Reviewed:  Reviewed past medical,surgical, social, obstetrical and family history.  Reviewed problem list, medications and allergies. Physical Assessment:   Vitals:   08/19/17 1325  BP: 126/78  Pulse: 68  Weight: 254 lb (115.2 kg)  Height: 5\' 2"  (1.575 m)  Body mass index is 46.46 kg/m.       Physical Examination:   General appearance: alert, well appearing, and in no distress  Mental status: alert, oriented to person, place, and time  Skin: warm & dry   Cardiovascular: normal heart rate noted  Respiratory: normal respiratory effort, no distress  Abdomen: soft, non-tender   Pelvic: VULVA: normal appearing vulva with no masses, tenderness or lesions, VAGINA: normal appearing vagina with normal color and greenish-yellow malodorous discharge, no lesions, CERVIX: normal appearing cervix without discharge or lesions  Extremities: no edema   Results for orders placed or performed in visit on 08/19/17 (from the past 24 hour(s))    POCT Wet Prep [facility])   Collection Time: 08/19/17  1:56 PM  Result Value Ref Range   Source Wet Prep POC vaginal    WBC, Wet Prep HPF POC many    Bacteria Wet Prep HPF POC None (A) Few   BACTERIA WET PREP MORPHOLOGY POC     Clue Cells Wet Prep HPF POC Moderate (A) None   Clue Cells Wet Prep Whiff POC Positive Whiff    Yeast Wet Prep HPF POC None    KOH Wet Prep POC     Trichomonas Wet Prep HPF POC Present (A) Absent    Assessment & Plan:  1) Trichomonas> Rx metronidazole 500mg  BID x 7d, no sex or etoh while taking. Pt states partner will be treated by his doctor. No sex x at least 7d from both finishing meds. F/u 4wks for poc. Will also check gc/ct   Orders Placed This Encounter  Procedures  . GC/Chlamydia Probe Amp  . POCT Wet Prep [facility])    Return in about 4 weeks (around 09/16/2017) for F/U.  Tawnya Crook CNM, [facility] 08/19/2017 1:57 PM

## 2017-08-19 NOTE — Patient Instructions (Signed)

## 2017-08-22 LAB — GC/CHLAMYDIA PROBE AMP
CHLAMYDIA, DNA PROBE: NEGATIVE
NEISSERIA GONORRHOEAE BY PCR: NEGATIVE

## 2017-09-16 ENCOUNTER — Ambulatory Visit (INDEPENDENT_AMBULATORY_CARE_PROVIDER_SITE_OTHER): Payer: Medicare HMO | Admitting: Women's Health

## 2017-09-16 ENCOUNTER — Encounter: Payer: Self-pay | Admitting: Women's Health

## 2017-09-16 DIAGNOSIS — A5901 Trichomonal vulvovaginitis: Secondary | ICD-10-CM | POA: Diagnosis not present

## 2017-09-16 LAB — POCT WET PREP (WET MOUNT)
CLUE CELLS WET PREP WHIFF POC: NEGATIVE
TRICHOMONAS WET PREP HPF POC: ABSENT

## 2017-09-16 NOTE — Progress Notes (Signed)
   GYN VISIT Patient name: [name] MRN [id number]  Date of birth: [birth year] Chief Complaint:   Follow-up  History of Present Illness:   [name] is a 57 y.o. [id number] African American female being seen today for trichomonas poc. Was dx 08/19/17, took all of metronidazole as rx'd. Has not had sex, is no longer w/ partner. Denies any current sx.      Patient's last menstrual period was 01/15/2012. The current method of family planning is tubal ligation and abstinence. Last pap 01/12/17. Results were:  neg w/ -HRHPV Review of Systems:   Pertinent items are noted in HPI Denies fever/chills, dizziness, headaches, visual disturbances, fatigue, shortness of breath, chest pain, abdominal pain, vomiting, abnormal vaginal discharge/itching/odor/irritation, problems with periods, bowel movements, urination, or intercourse unless otherwise stated above.  Pertinent History Reviewed:  Reviewed past medical,surgical, social, obstetrical and family history.  Reviewed problem list, medications and allergies. Physical Assessment:   Vitals:   09/16/17 0924  BP: 130/78  Pulse: 80  Weight: 254 lb (115.2 kg)  Height: 5\' 2"  (1.575 m)  Body mass index is 46.46 kg/m.       Physical Examination:   General appearance: alert, well appearing, and in no distress  Mental status: alert, oriented to person, place, and time  Skin: warm & dry   Cardiovascular: normal heart rate noted  Respiratory: normal respiratory effort, no distress  Abdomen: soft, non-tender   Pelvic: VULVA: normal appearing vulva with no masses, tenderness or lesions, VAGINA: normal appearing vagina with normal color and discharge, no lesions, CERVIX: normal appearing cervix without discharge or lesions  Extremities: no edema   Results for orders placed or performed in visit on 09/16/17 (from the past 24 hour(s))  POCT Wet Prep [facility])   Collection Time: 09/16/17  9:41 AM  Result Value Ref Range   Source Wet Prep  POC vaginal    WBC, Wet Prep HPF POC none    Bacteria Wet Prep HPF POC Few Few   BACTERIA WET PREP MORPHOLOGY POC     Clue Cells Wet Prep HPF POC None None   Clue Cells Wet Prep Whiff POC Negative Whiff    Yeast Wet Prep HPF POC None    KOH Wet Prep POC     Trichomonas Wet Prep HPF POC Absent Absent    Assessment & Plan:  1) Trichomonas poc> neg  Meds: No orders of the defined types were placed in this encounter.   Orders Placed This Encounter  Procedures  . POCT Wet Prep [facility])    Return for after 5/15 for , Physical.  Tawnya Crook CNM, [facility] 09/16/2017 9:41 AM

## 2017-11-11 ENCOUNTER — Ambulatory Visit (INDEPENDENT_AMBULATORY_CARE_PROVIDER_SITE_OTHER): Payer: Medicare HMO | Admitting: Women's Health

## 2017-11-11 ENCOUNTER — Encounter: Payer: Self-pay | Admitting: Women's Health

## 2017-11-11 VITALS — BP 120/80 | HR 92 | Ht 62.0 in | Wt 258.2 lb

## 2017-11-11 DIAGNOSIS — N898 Other specified noninflammatory disorders of vagina: Secondary | ICD-10-CM

## 2017-11-11 DIAGNOSIS — Z113 Encounter for screening for infections with a predominantly sexual mode of transmission: Secondary | ICD-10-CM | POA: Diagnosis not present

## 2017-11-11 DIAGNOSIS — B3731 Acute candidiasis of vulva and vagina: Secondary | ICD-10-CM

## 2017-11-11 DIAGNOSIS — B373 Candidiasis of vulva and vagina: Secondary | ICD-10-CM

## 2017-11-11 LAB — POCT WET PREP (WET MOUNT)
Clue Cells Wet Prep Whiff POC: NEGATIVE
TRICHOMONAS WET PREP HPF POC: ABSENT

## 2017-11-11 MED ORDER — FLUCONAZOLE 150 MG PO TABS: 150.0000 mg | ORAL_TABLET | Freq: Once | ORAL | 0 refills | Status: AC

## 2017-11-11 NOTE — Progress Notes (Signed)
   GYN VISIT Patient name: [name] MRN [id number]  Date of birth: Sep 28, 1960 Chief Complaint:   Vaginal Discharge (odor)  History of Present Illness:   [name] is a 57 y.o. [id number] African American female being seen today for report of vaginal discharge with odor and itching since Sunday.  Is back with her boyfriend, had sex on Friday, not sure if he has been with anyone else. Was treated for trichomonas in Dec and POC was neg in Jan. GC/CT was neg in Dec, wants to check again.   Patient's last menstrual period was 01/15/2012. The current method of family planning is tubal ligation. Last pap 01/12/17. Results were:  neg w/ -HRHPV Review of Systems:   Pertinent items are noted in HPI Denies fever/chills, dizziness, headaches, visual disturbances, fatigue, shortness of breath, chest pain, abdominal pain, vomiting, abnormal vaginal discharge/itching/odor/irritation, problems with periods, bowel movements, urination, or intercourse unless otherwise stated above.  Pertinent History Reviewed:  Reviewed past medical,surgical, social, obstetrical and family history.  Reviewed problem list, medications and allergies. Physical Assessment:   Vitals:   11/11/17 0901  BP: 120/80  Pulse: 92  Weight: 258 lb 3.2 oz (117.1 kg)  Height: 5\' 2"  (1.575 m)  Body mass index is 47.23 kg/m.       Physical Examination:   General appearance: alert, well appearing, and in no distress  Mental status: alert, oriented to person, place, and time  Skin: warm & dry   Cardiovascular: normal heart rate noted  Respiratory: normal respiratory effort, no distress  Abdomen: soft, non-tender   Pelvic: VULVA: normal appearing vulva with no masses, tenderness or lesions, VAGINA: normal appearing vagina with normal color and discharge, no lesions, CERVIX: normal appearing cervix without discharge or lesions  Extremities: no edema   Results for orders placed or performed in visit on 11/11/17 (from the  past 24 hour(s))  POCT Wet Prep [facility])   Collection Time: 11/11/17  9:51 AM  Result Value Ref Range   Source Wet Prep POC vaginal    WBC, Wet Prep HPF POC none    Bacteria Wet Prep HPF POC None (A) Few   BACTERIA WET PREP MORPHOLOGY POC     Clue Cells Wet Prep HPF POC None None   Clue Cells Wet Prep Whiff POC Negative Whiff    Yeast Wet Prep HPF POC Few    KOH Wet Prep POC     Trichomonas Wet Prep HPF POC Absent Absent    Assessment & Plan:  1) Mild vaginal candida> rx diflucan  2) STD screen> will send urine for gc/ct  Meds:  Meds ordered this encounter  Medications  . fluconazole (DIFLUCAN) 150 MG tablet    Sig: Take 1 tablet (150 mg total) by mouth once for 1 dose. Take 1 pill now, may take 2nd pill in 3 days if needed    Dispense:  2 tablet    Refill:  0    Order Specific Question:   Supervising Provider    Answer:   Florian Buff [[id number]]    Orders Placed This Encounter  Procedures  . GC/Chlamydia Probe Amp  . POCT Wet Prep [facility])    Return for after 5/15 for , Physical.  Roma Schanz CNM, [facility] 11/11/2017 9:54 AM

## 2017-11-12 LAB — GC/CHLAMYDIA PROBE AMP
CHLAMYDIA, DNA PROBE: NEGATIVE
NEISSERIA GONORRHOEAE BY PCR: NEGATIVE

## 2017-11-25 ENCOUNTER — Ambulatory Visit: Payer: Medicare HMO | Admitting: Gastroenterology

## 2017-11-25 ENCOUNTER — Ambulatory Visit: Payer: Medicare HMO

## 2018-01-13 ENCOUNTER — Encounter: Payer: Self-pay | Admitting: Obstetrics & Gynecology

## 2018-01-13 ENCOUNTER — Encounter (INDEPENDENT_AMBULATORY_CARE_PROVIDER_SITE_OTHER): Payer: Self-pay

## 2018-01-13 ENCOUNTER — Other Ambulatory Visit (HOSPITAL_COMMUNITY)
Admission: RE | Admit: 2018-01-13 | Discharge: 2018-01-13 | Disposition: A | Discharge status: Home / Self Care | Payer: Medicare HMO | Source: Ambulatory Visit | Attending: Obstetrics & Gynecology | Admitting: Obstetrics & Gynecology

## 2018-01-13 ENCOUNTER — Ambulatory Visit (INDEPENDENT_AMBULATORY_CARE_PROVIDER_SITE_OTHER): Payer: Medicare HMO | Admitting: Obstetrics & Gynecology

## 2018-01-13 VITALS — BP 128/66 | HR 84 | Ht 62.0 in | Wt 255.0 lb

## 2018-01-13 DIAGNOSIS — Z1211 Encounter for screening for malignant neoplasm of colon: Secondary | ICD-10-CM | POA: Diagnosis not present

## 2018-01-13 DIAGNOSIS — Z01419 Encounter for gynecological examination (general) (routine) without abnormal findings: Secondary | ICD-10-CM

## 2018-01-13 DIAGNOSIS — Z87891 Personal history of nicotine dependence: Secondary | ICD-10-CM | POA: Insufficient documentation

## 2018-01-13 DIAGNOSIS — Z1212 Encounter for screening for malignant neoplasm of rectum: Secondary | ICD-10-CM

## 2018-01-13 MED ORDER — SILVER SULFADIAZINE 1 % EX CREA: TOPICAL_CREAM | CUTANEOUS | 11 refills | Status: DC

## 2018-01-13 NOTE — Addendum Note (Signed)
Addended by: Christiana Pellant A on: 01/13/2018 09:42 AM   Modules accepted: Orders

## 2018-01-13 NOTE — Progress Notes (Signed)
Subjective:     [name] is a 57 y.o. female here for a routine exam.  Patient's last menstrual period was 01/15/2012. [id number] Birth Control Method:  BTL Menstrual Calendar(currently): amenorrheic off megestrol  Current complaints: left knee scrape.   Current acute medical issues:  diabetes   Recent Gynecologic History Patient's last menstrual period was 01/15/2012. Last Pap: 2018,  normal Last mammogram: 2018,  normal  Past Medical History:  Diagnosis Date  . Arthritis   . DOE (dyspnea on exertion)   . Eczema   . Glaucoma   . Infection    r knee  . Muscle spasm of back   . Positive ANA (antinuclear antibody)    1:180 homogenous pattern    Past Surgical History:  Procedure Laterality Date  . IRRIGATION AND DEBRIDEMENT KNEE Right 05/12/2013   Procedure: IRRIGATION AND DEBRIDEMENT KNEE;  Surgeon: Carole Civil, MD;  Location: [facility];  Service: Orthopedics;  Laterality: Right;  . JOINT REPLACEMENT     r knee  . LIPOMA EXCISION  11/16/2011   Procedure: EXCISION LIPOMA;  Surgeon: Scherry Ran, MD;  Location: [facility];  Service: General;  Laterality: Left;  Excision of neoplasm left shoulder  . MOUTH SURGERY    . MULTIPLE EXTRACTIONS WITH ALVEOLOPLASTY  12/28/2011   Procedure: MULTIPLE EXTRACION WITH ALVEOLOPLASTY;  Surgeon: Gae Bon, DDS;  Location: [place];  Service: Oral Surgery;  Laterality: Bilateral;  . PATELLAR TENDON REPAIR Right 05/12/2013   Procedure: PATELLA TENDON REPAIR AND ALLOGRAFT RECONSTRUCTION;  Surgeon: Carole Civil, MD;  Location: [facility];  Service: Orthopedics;  Laterality: Right;  . resection arthroplasty right total knee  01/28/16   Dr Starlyn Skeans, [facility]; antibiotic spacer placed  . SHOULDER SURGERY Left   . TOTAL KNEE ARTHROPLASTY Right 04/17/2013   Procedure: RIGHT TOTAL KNEE ARTHROPLASTY;  Surgeon: Carole Civil, MD;  Location: [facility];  Service: Orthopedics;  Laterality: Right;  . TOTAL KNEE ARTHROPLASTY Right 05/12/2013   Procedure: POLY EXCHANGE;  Surgeon: Carole Civil, MD;  Location: [facility];  Service: Orthopedics;  Laterality: Right;  . TUBAL LIGATION     and burned per patient.     OB History    Gravida  5   Para  5   Term  5   Preterm      AB      Living  6     SAB      TAB      Ectopic      Multiple  1   Live Births              Social History   Socioeconomic History  . Marital status: Single    Spouse name: Not on file  . Number of children: Not on file  . Years of education: 29  . Highest education level: Not on file  Occupational History    Employer: NOT EMPLOYED  Social Needs  . Financial resource strain: Not on file  . Food insecurity:    Worry: Not on file    Inability: Not on file  . Transportation needs:    Medical: Not on file    Non-medical: Not on file  Tobacco Use  . Smoking status: Former Smoker    Packs/day: 0.10    Years: 1.00    Pack years: 0.10    Types: Cigarettes    Last attempt to quit: 01/28/2016    Years since quitting: 1.9  . Smokeless tobacco: Never Used  .  Tobacco comment: Discontinued while at hospital and SNF as of 01/28/16 ; previously "socail smoker" age [age]. Never > 1/2 ppd  Substance and Sexual Activity  . Alcohol use: Yes    Alcohol/week: 0.0 oz    Comment:  occasionally  . Drug use: No  . Sexual activity: Yes    Birth control/protection: None  Lifestyle  . Physical activity:    Days per week: Not on file    Minutes per session: Not on file  . Stress: Not on file  Relationships  . Social connections:    Talks on phone: Not on file    Gets together: Not on file    Attends religious service: Not on file    Active member of club or organization: Not on file    Attends meetings of clubs or organizations: Not on file    Relationship status: Not on file  Other Topics Concern  . Not on file  Social History Narrative  . Not on file    Family History  Problem Relation Age of Onset  . Diabetes Brother   .  Diabetes Mother   . Multiple sclerosis Sister   . Arthritis Neg Hx   . Anesthesia problems Neg Hx   . Hypotension Neg Hx   . Malignant hyperthermia Neg Hx   . Pseudochol deficiency Neg Hx   . Cancer Neg Hx   . Heart disease Neg Hx   . Stroke Neg Hx      Current Outpatient Medications:  .  aspirin 325 MG tablet, Take 325 mg daily by mouth. , Disp: , Rfl:  .  atorvastatin (LIPITOR) 20 MG tablet, Take 20 mg by mouth daily., Disp: , Rfl:  .  bisacodyl (WOMENS LAXATIVE) 5 MG EC tablet, Take 5 mg daily as needed by mouth for moderate constipation., Disp: , Rfl:  .  clobetasol cream (TEMOVATE) 4.76 %, Apply 1 application 2 (two) times daily as needed topically (for dry skin.). , Disp: , Rfl:  .  cyclobenzaprine (FLEXERIL) 10 MG tablet, Take 10 mg 4 (four) times daily as needed by mouth for muscle spasms. , Disp: , Rfl:  .  IBU 800 MG tablet, Take 800 mg every 4 (four) hours as needed by mouth. For pain., Disp: , Rfl:  .  ketoconazole (NIZORAL) 2 % cream, Apply 1 application daily as needed topically for irritation., Disp: , Rfl:  .  lisinopril (PRINIVIL,ZESTRIL) 5 MG tablet, Take 5 mg by mouth daily., Disp: , Rfl:  .  metFORMIN (GLUCOPHAGE) 500 MG tablet, Take 500 mg 2 (two) times daily by mouth. , Disp: , Rfl:  .  travoprost, benzalkonium, (TRAVATAN) 0.004 % ophthalmic solution, Place 1 drop into both eyes at bedtime. , Disp: , Rfl:  .  triamcinolone cream (KENALOG) 0.1 %, Apply 1 application 2 (two) times daily as needed topically (for skin). Apply to areas of dry skin once a day , Disp: , Rfl:  .  zolpidem (AMBIEN) 10 MG tablet, Take 1 tablet (10 mg total) by mouth at bedtime as needed for sleep. (Patient taking differently: Take 10 mg at bedtime by mouth. ), Disp: 30 tablet, Rfl: 0 .  diphenhydrAMINE (BENADRYL) 25 mg capsule, Take 25 mg every 8 (eight) hours as needed by mouth (for itching/skin crawling). , Disp: , Rfl:  .  ferrous sulfate 325 (65 FE) MG tablet, Take 325 mg daily by mouth. ,  Disp: , Rfl:  .  megestrol (MEGACE) 40 MG tablet, TAKE 1 TABLET BY MOUTH  ONCE DAILY. (Patient not taking: Reported on 11/11/2017), Disp: 30 tablet, Rfl: 11 .  metroNIDAZOLE (FLAGYL) 500 MG tablet, Take 1 tablet (500 mg total) by mouth 2 (two) times daily. (Patient not taking: Reported on 09/16/2017), Disp: 14 tablet, Rfl: 0 .  polyethylene glycol-electrolytes (TRILYTE) 420 g solution, Take 4,000 mLs by mouth as directed. (Patient not taking: Reported on 01/13/2018), Disp: 4000 mL, Rfl: 0 .  silver sulfADIAZINE (SILVADENE) 1 % cream, Use to are 3-4 times daily, Disp: 50 g, Rfl: 11  Review of Systems  Review of Systems  Constitutional: Negative for fever, chills, weight loss, malaise/fatigue and diaphoresis.  HENT: Negative for hearing loss, ear pain, nosebleeds, congestion, sore throat, neck pain, tinnitus and ear discharge.   Eyes: Negative for blurred vision, double vision, photophobia, pain, discharge and redness.  Respiratory: Negative for cough, hemoptysis, sputum production, shortness of breath, wheezing and stridor.   Cardiovascular: Negative for chest pain, palpitations, orthopnea, claudication, leg swelling and PND.  Gastrointestinal: negative for abdominal pain. Negative for heartburn, nausea, vomiting, diarrhea, constipation, blood in stool and melena.  Genitourinary: Negative for dysuria, urgency, frequency, hematuria and flank pain.  Musculoskeletal: Negative for myalgias, back pain, joint pain and falls.  Skin: Negative for itching and rash.  Neurological: Negative for dizziness, tingling, tremors, sensory change, speech change, focal weakness, seizures, loss of consciousness, weakness and headaches.  Endo/Heme/Allergies: Negative for environmental allergies and polydipsia. Does not bruise/bleed easily.  Psychiatric/Behavioral: Negative for depression, suicidal ideas, hallucinations, memory loss and substance abuse. The patient is not nervous/anxious and does not have insomnia.         Objective:  Blood pressure 128/66, pulse 84, height 5\' 2"  (1.575 m), weight 255 lb (115.7 kg), last menstrual period 01/15/2012.   Physical Exam  Vitals reviewed. Constitutional: She is oriented to person, place, and time. She appears well-developed and well-nourished.  HENT:  Head: Normocephalic and atraumatic.        Right Ear: External ear normal.  Left Ear: External ear normal.  Nose: Nose normal.  Mouth/Throat: Oropharynx is clear and moist.  Eyes: Conjunctivae and EOM are normal. Pupils are equal, round, and reactive to light. Right eye exhibits no discharge. Left eye exhibits no discharge. No scleral icterus.  Neck: Normal range of motion. Neck supple. No tracheal deviation present. No thyromegaly present.  Cardiovascular: Normal rate, regular rhythm, normal heart sounds and intact distal pulses.  Exam reveals no gallop and no friction rub.   No murmur heard. Respiratory: Effort normal and breath sounds normal. No respiratory distress. She has no wheezes. She has no rales. She exhibits no tenderness.  GI: Soft. Bowel sounds are normal. She exhibits no distension and no mass. There is no tenderness. There is no rebound and no guarding.  Genitourinary:  Breasts no masses skin changes or nipple changes bilaterally      Vulva is normal without lesions Vagina is pink moist without discharge Cervix normal in appearance and pap is done Uterus is normal size shape and contour Adnexa is negative with normal sized ovaries  {Rectal    hemoccult negative, normal tone, no masses  Musculoskeletal: Normal range of motion. She exhibits no edema and no tenderness.  Neurological: She is alert and oriented to person, place, and time. She has normal reflexes. She displays normal reflexes. No cranial nerve deficit. She exhibits normal muscle tone. Coordination normal.  Skin: Skin is warm and dry. No rash noted. No erythema. No pallor.  Psychiatric: She has a normal mood and affect.  Her behavior is  normal. Judgment and thought content normal.       Medications Ordered at today's visit: Meds ordered this encounter  Medications  . silver sulfADIAZINE (SILVADENE) 1 % cream    Sig: Use to are 3-4 times daily    Dispense:  50 g    Refill:  11    Other orders placed at today's visit: No orders of the defined types were placed in this encounter.     Assessment:    Healthy female exam.   Amenorrheic off of megestrol Left knee scrape, prescribe silvadene Plan:    Mammogram ordered. Follow up in: 1 year.     Return in about 1 year (around 01/14/2019) for yearly.

## 2018-01-15 LAB — NUSWAB VAGINITIS PLUS (VG+)
CANDIDA ALBICANS, NAA: NEGATIVE
CANDIDA GLABRATA, NAA: NEGATIVE
Chlamydia trachomatis, NAA: NEGATIVE
Neisseria gonorrhoeae, NAA: NEGATIVE
Trich vag by NAA: NEGATIVE

## 2018-01-17 LAB — CYTOLOGY - PAP
Adequacy: ABSENT
CHLAMYDIA, DNA PROBE: NEGATIVE
Diagnosis: NEGATIVE
HPV (WINDOPATH): NOT DETECTED
Neisseria Gonorrhea: NEGATIVE

## 2018-01-18 ENCOUNTER — Ambulatory Visit: Payer: Medicare HMO | Admitting: Gastroenterology

## 2018-01-21 ENCOUNTER — Ambulatory Visit (INDEPENDENT_AMBULATORY_CARE_PROVIDER_SITE_OTHER): Payer: Medicare HMO | Admitting: Gastroenterology

## 2018-01-21 ENCOUNTER — Other Ambulatory Visit: Payer: Self-pay | Admitting: *Deleted

## 2018-01-21 ENCOUNTER — Encounter: Payer: Self-pay | Admitting: Gastroenterology

## 2018-01-21 ENCOUNTER — Encounter: Payer: Self-pay | Admitting: *Deleted

## 2018-01-21 VITALS — BP 120/64 | HR 106 | Temp 98.9°F | Ht 62.0 in | Wt 252.6 lb

## 2018-01-21 DIAGNOSIS — Z1211 Encounter for screening for malignant neoplasm of colon: Secondary | ICD-10-CM | POA: Diagnosis not present

## 2018-01-21 DIAGNOSIS — K219 Gastro-esophageal reflux disease without esophagitis: Secondary | ICD-10-CM | POA: Insufficient documentation

## 2018-01-21 MED ORDER — PEG 3350-KCL-NA BICARB-NACL 420 G PO SOLR: 4000.0000 mL | Freq: Once | ORAL | 0 refills | Status: AC

## 2018-01-21 MED ORDER — PANTOPRAZOLE SODIUM 40 MG PO TBEC: 40.0000 mg | DELAYED_RELEASE_TABLET | Freq: Every day | ORAL | 3 refills | Status: DC

## 2018-01-21 NOTE — Progress Notes (Signed)
Primary Care Physician:  Rosita Fire, MD Primary Gastroenterologist:  Dr. Oneida Alar   Chief Complaint  Patient presents with  . Consult    colonoscopy    HPI:   [name] is a 57 y.o. female presenting today at the request of Dr. Legrand Rams for initial screening colonoscopy.  Notes occasional constipation but not significant. Dulcolax possibly once per week. Last month saw scant tissue hematochezia. No abdominal pain. Had nausea the other day but resolved. Certain things she eats with grease causes nausea. Has chronic reflux. Uses baking soda/water every day. No dysphagia. No weight loss, lack of appetite. NSAIDs routinely for joint pain. 325 mg aspirin daily.   Past Medical History:  Diagnosis Date  . Arthritis   . Diabetes mellitus ([place])   . DOE (dyspnea on exertion)   . Eczema   . Glaucoma   . Hypercholesterolemia   . Hypertension   . Infection    r knee  . Muscle spasm of back   . Positive ANA (antinuclear antibody)    1:180 homogenous pattern    Past Surgical History:  Procedure Laterality Date  . IRRIGATION AND DEBRIDEMENT KNEE Right 05/12/2013   Procedure: IRRIGATION AND DEBRIDEMENT KNEE;  Surgeon: Carole Civil, MD;  Location: [facility];  Service: Orthopedics;  Laterality: Right;  . JOINT REPLACEMENT     r knee  . LIPOMA EXCISION  11/16/2011   Procedure: EXCISION LIPOMA;  Surgeon: Scherry Ran, MD;  Location: [facility];  Service: General;  Laterality: Left;  Excision of neoplasm left shoulder  . MOUTH SURGERY    . MULTIPLE EXTRACTIONS WITH ALVEOLOPLASTY  12/28/2011   Procedure: MULTIPLE EXTRACION WITH ALVEOLOPLASTY;  Surgeon: Gae Bon, DDS;  Location: [place];  Service: Oral Surgery;  Laterality: Bilateral;  . PATELLAR TENDON REPAIR Right 05/12/2013   Procedure: PATELLA TENDON REPAIR AND ALLOGRAFT RECONSTRUCTION;  Surgeon: Carole Civil, MD;  Location: [facility];  Service: Orthopedics;  Laterality: Right;  . resection arthroplasty right total knee   01/28/16   Dr Starlyn Skeans, [facility]; antibiotic spacer placed  . SHOULDER SURGERY Left   . TOTAL KNEE ARTHROPLASTY Right 04/17/2013   Procedure: RIGHT TOTAL KNEE ARTHROPLASTY;  Surgeon: Carole Civil, MD;  Location: [facility];  Service: Orthopedics;  Laterality: Right;  . TOTAL KNEE ARTHROPLASTY Right 05/12/2013   Procedure: POLY EXCHANGE;  Surgeon: Carole Civil, MD;  Location: [facility];  Service: Orthopedics;  Laterality: Right;  . TUBAL LIGATION     and burned per patient.     Current Outpatient Medications  Medication Sig Dispense Refill  . aspirin 325 MG tablet Take 325 mg daily by mouth.     Marland Kitchen atorvastatin (LIPITOR) 20 MG tablet Take 20 mg by mouth daily.    . bisacodyl (WOMENS LAXATIVE) 5 MG EC tablet Take 5 mg daily as needed by mouth for moderate constipation.    . clobetasol cream (TEMOVATE) 8.92 % Apply 1 application 2 (two) times daily as needed topically (for dry skin.).     Marland Kitchen cyclobenzaprine (FLEXERIL) 10 MG tablet Take 10 mg 4 (four) times daily as needed by mouth for muscle spasms.     . diphenhydrAMINE (BENADRYL) 25 mg capsule Take 25 mg every 8 (eight) hours as needed by mouth (for itching/skin crawling).     . IBU 800 MG tablet Take 800 mg every 4 (four) hours as needed by mouth. For pain.    Marland Kitchen lisinopril (PRINIVIL,ZESTRIL) 5 MG tablet Take 5 mg by mouth  daily.    . metFORMIN (GLUCOPHAGE) 500 MG tablet Take 500 mg 2 (two) times daily by mouth.     . travoprost, benzalkonium, (TRAVATAN) 0.004 % ophthalmic solution Place 1 drop into both eyes at bedtime.     Marland Kitchen zolpidem (AMBIEN) 10 MG tablet Take 1 tablet (10 mg total) by mouth at bedtime as needed for sleep. (Patient taking differently: Take 10 mg at bedtime by mouth. ) 30 tablet 0  . pantoprazole (PROTONIX) 40 MG tablet Take 1 tablet (40 mg total) by mouth daily. Take 30 minutes before breakfast daily 90 tablet 3   No current facility-administered medications for this visit.     Allergies as of 01/21/2018 - Review  Complete 01/21/2018  Allergen Reaction Noted  . Chocolate Hives 11/13/2011  . Nystatin Anxiety 02/06/2016    Family History  Problem Relation Age of Onset  . Diabetes Brother   . Diabetes Mother   . Multiple sclerosis Sister   . Arthritis Neg Hx   . Anesthesia problems Neg Hx   . Hypotension Neg Hx   . Malignant hyperthermia Neg Hx   . Pseudochol deficiency Neg Hx   . Cancer Neg Hx   . Heart disease Neg Hx   . Stroke Neg Hx   . Colon cancer Neg Hx   . Colon polyps Neg Hx     Social History   Socioeconomic History  . Marital status: Single    Spouse name: Not on file  . Number of children: Not on file  . Years of education: 69  . Highest education level: Not on file  Occupational History    Employer: NOT EMPLOYED  Social Needs  . Financial resource strain: Not on file  . Food insecurity:    Worry: Not on file    Inability: Not on file  . Transportation needs:    Medical: Not on file    Non-medical: Not on file  Tobacco Use  . Smoking status: Current Every Day Smoker    Packs/day: 0.10    Years: 1.00    Pack years: 0.10    Types: Cigarettes    Last attempt to quit: 01/28/2016    Years since quitting: 1.9  . Smokeless tobacco: Never Used  Substance and Sexual Activity  . Alcohol use: Yes    Alcohol/week: 0.0 oz    Comment: "once every blue moon"   . Drug use: No  . Sexual activity: Yes    Birth control/protection: None  Lifestyle  . Physical activity:    Days per week: Not on file    Minutes per session: Not on file  . Stress: Not on file  Relationships  . Social connections:    Talks on phone: Not on file    Gets together: Not on file    Attends religious service: Not on file    Active member of club or organization: Not on file    Attends meetings of clubs or organizations: Not on file    Relationship status: Not on file  . Intimate partner violence:    Fear of current or ex partner: Not on file    Emotionally abused: Not on file    Physically  abused: Not on file    Forced sexual activity: Not on file  Other Topics Concern  . Not on file  Social History Narrative  . Not on file    Review of Systems: Gen: Denies any fever, chills, fatigue, weight loss, lack of appetite.  CV: Denies  chest pain, heart palpitations, peripheral edema, syncope.  Resp: Denies shortness of breath at rest or with exertion. Denies wheezing or cough.  GI: see HPI  GU : Denies urinary burning, urinary frequency, urinary hesitancy MS: knee/joint pain  Derm: Denies rash, itching, dry skin Psych: Denies depression, anxiety, memory loss, and confusion Heme: see HPI   Physical Exam: BP 120/64   Pulse (!) 106   Temp 98.9 F (37.2 C) (Oral)   Ht 5\' 2"  (1.575 m)   Wt 252 lb 9.6 oz (114.6 kg)   LMP 01/15/2012   BMI 46.20 kg/m  General:   Alert and oriented. Pleasant and cooperative. Well-nourished and well-developed.  Head:  Normocephalic and atraumatic. Eyes:  Without icterus, sclera clear and conjunctiva pink.  Ears:  Normal auditory acuity. Nose:  No deformity, discharge,  or lesions. Mouth:   oral mucosa pink.  Lungs:  Clear to auscultation bilaterally. No wheezes, rales, or rhonchi. No distress.  Heart:  S1, S2 present query soft systolic murmur  Abdomen:  +BS, soft, non-tender and non-distended. No HSM noted. No guarding or rebound. No masses appreciated.  Rectal:  Deferred  Msk:  Rod in right leg, cane for ambulation  Extremities:  Without clubbing or edema. Neurologic:  Alert and  oriented x4;  grossly normal neurologically. Psych:  Alert and cooperative. Normal mood and affect.

## 2018-01-21 NOTE — Assessment & Plan Note (Signed)
Chronic GERD, no alarm symptoms. She is on Ibuprofen and aspirin 325 mg daily. Intermittent nausea with certain foods. In setting of chronic NSAIDs, high dose aspirin, no PPI and daily symptoms, start Protonix once daily. Discussed limiting NSAIDs and would also reduce dosing of aspirin (?no cardiac history I am aware of) due to risk for NSAID injury to GI tract: leave to PCP's discretion. Return in 4 months to follow-up on GERD.

## 2018-01-21 NOTE — Assessment & Plan Note (Signed)
57 year old pleasant female with need for initial screening colonoscopy. No family history of colorectal cancer or polyps. Notes scant tissue hematochezia occasionally if constipation. Likely benign source.   Proceed with colonoscopy with Dr. Oneida Alar in the near future. The risks, benefits, and alternatives have been discussed in detail with the patient. They state understanding and desire to proceed.  Propofol due to polypharmacy No metformin day of procedure

## 2018-01-21 NOTE — Patient Instructions (Addendum)
For reflux: start taking Protonix once each morning, 30 minutes before breakfast. Watch for any problems swallowing, abdominal pain, worsening nausea. I would limit the Ibuprofen as much as possible. Also, talk with your doctor about possibly decreasing the aspirin dose. The combination of these medications can cause inflammation/ulcers in your GI tract.   We have scheduled a colonoscopy in the near future with Dr. Oneida Alar. Do not take any metformin the day of the procedure.  We will see you in 4 months!  It was a pleasure to see you today. I strive to create trusting relationships with patients to provide genuine, compassionate, and quality care. I value your feedback. If you receive a survey regarding your visit,  I greatly appreciate you taking time to fill this out.   Annitta Needs, PhD, ANP-BC [facility]    Gastroesophageal Reflux Disease, Adult Normally, food travels down the esophagus and stays in the stomach to be digested. If a person has gastroesophageal reflux disease (GERD), food and stomach acid move back up into the esophagus. When this happens, the esophagus becomes sore and swollen (inflamed). Over time, GERD can make small holes (ulcers) in the lining of the esophagus. Follow these instructions at home: Diet  Follow a diet as told by your doctor. You may need to avoid foods and drinks such as: ? Coffee and tea (with or without caffeine). ? Drinks that contain alcohol. ? Energy drinks and sports drinks. ? Carbonated drinks or sodas. ? Chocolate and cocoa. ? Peppermint and mint flavorings. ? Garlic and onions. ? Horseradish. ? Spicy and acidic foods, such as peppers, chili powder, curry powder, vinegar, hot sauces, and BBQ sauce. ? Citrus fruit juices and citrus fruits, such as oranges, lemons, and limes. ? Tomato-based foods, such as red sauce, chili, salsa, and pizza with red sauce. ? Fried and fatty foods, such as donuts, french fries, potato chips, and  high-fat dressings. ? High-fat meats, such as hot dogs, rib eye steak, sausage, ham, and bacon. ? High-fat dairy items, such as whole milk, butter, and cream cheese.  Eat small meals often. Avoid eating large meals.  Avoid drinking large amounts of liquid with your meals.  Avoid eating meals during the 2-3 hours before bedtime.  Avoid lying down right after you eat.  Do not exercise right after you eat. General instructions  Pay attention to any changes in your symptoms.  Take over-the-counter and prescription medicines only as told by your doctor. Do not take aspirin, ibuprofen, or other NSAIDs unless your doctor says it is okay.  Do not use any tobacco products, including cigarettes, chewing tobacco, and e-cigarettes. If you need help quitting, ask your doctor.  Wear loose clothes. Do not wear anything tight around your waist.  Raise (elevate) the head of your bed about 6 inches (15 cm).  Try to lower your stress. If you need help doing this, ask your doctor.  If you are overweight, lose an amount of weight that is healthy for you. Ask your doctor about a safe weight loss goal.  Keep all follow-up visits as told by your doctor. This is important. Contact a doctor if:  You have new symptoms.  You lose weight and you do not know why it is happening.  You have trouble swallowing, or it hurts to swallow.  You have wheezing or a cough that keeps happening.  Your symptoms do not get better with treatment.  You have a hoarse voice. Get help right away if:  You  have pain in your arms, neck, jaw, teeth, or back.  You feel sweaty, dizzy, or light-headed.  You have chest pain or shortness of breath.  You throw up (vomit) and your throw up looks like blood or coffee grounds.  You pass out (faint).  Your poop (stool) is bloody or black.  You cannot swallow, drink, or eat. This information is not intended to replace advice given to you by your health care provider. Make  sure you discuss any questions you have with your health care provider. Document Released: 02/03/2008 Document Revised: 01/23/2016 Document Reviewed: 12/12/2014 Elsevier Interactive Patient Education  [employer].

## 2018-01-25 ENCOUNTER — Telehealth: Payer: Self-pay | Admitting: *Deleted

## 2018-01-25 NOTE — Progress Notes (Signed)
CC'D TO PCP °

## 2018-01-25 NOTE — Telephone Encounter (Signed)
Pre-op scheduled for 03/29/18 at 10:00am. Letter mailed. LMOVM.

## 2018-02-09 ENCOUNTER — Other Ambulatory Visit: Payer: Self-pay | Admitting: Obstetrics & Gynecology

## 2018-02-09 DIAGNOSIS — Z1231 Encounter for screening mammogram for malignant neoplasm of breast: Secondary | ICD-10-CM

## 2018-02-17 ENCOUNTER — Ambulatory Visit (HOSPITAL_COMMUNITY)
Admission: RE | Admit: 2018-02-17 | Discharge: 2018-02-17 | Disposition: A | Discharge status: Home / Self Care | Payer: Medicare HMO | Source: Ambulatory Visit | Attending: Obstetrics & Gynecology | Admitting: Obstetrics & Gynecology

## 2018-02-17 DIAGNOSIS — Z1231 Encounter for screening mammogram for malignant neoplasm of breast: Secondary | ICD-10-CM | POA: Insufficient documentation

## 2018-02-28 ENCOUNTER — Telehealth: Payer: Self-pay | Admitting: *Deleted

## 2018-02-28 NOTE — Telephone Encounter (Signed)
[employer] and spoke with Mae (PA intake rep). Was advised no PA is required for TCS. Ref # [id number]

## 2018-03-25 NOTE — Patient Instructions (Signed)
THALIA TURKINGTON  03/25/2018     @PREFPERIOPPHARMACY @   Your procedure is scheduled on  04/05/2018 .  Report to [facility] at  745   A.M.  Call this number if you have problems the morning of surgery:  [phone]   Remember:  Do not eat or drink after midnight.  You may drink clear liquids until (follow the instructions given to you by Dr Oneida Alar office .  Clear liquids allowed are:                    Water, Juice (non-citric and without pulp), Carbonated beverages, Clear Tea, Black Coffee only, Plain Jell-O only, Gatorade and Plain Popsicles only    Take these medicines the morning of surgery with A SIP OF WATER  Flexaril, lisinopril, protonix.    Do not wear jewelry, make-up or nail polish.  Do not wear lotions, powders, or perfumes, or deodorant.  Do not shave 48 hours prior to surgery.  Men may shave face and neck.  Do not bring valuables to the hospital.  [facility] is not responsible for any belongings or valuables.  Contacts, dentures or bridgework may not be worn into surgery.  Leave your suitcase in the car.  After surgery it may be brought to your room.  For patients admitted to the hospital, discharge time will be determined by your treatment team.  Patients discharged the day of surgery will not be allowed to drive home.   Name and phone number of your driver:   family Special instructions:  Follow the diet and prep instructions given to you by Dr Oneida Alar office.  Please read over the following fact sheets that you were given. Anesthesia Post-op Instructions and Care and Recovery After Surgery       Colonoscopy, Adult A colonoscopy is an exam to look at the large intestine. It is done to check for problems, such as:  Lumps (tumors).  Growths (polyps).  Swelling (inflammation).  Bleeding.  What happens before the procedure? Eating and drinking Follow instructions from your doctor about eating and drinking. These instructions may  include:  A few days before the procedure - follow a low-fiber diet. ? Avoid nuts. ? Avoid seeds. ? Avoid dried fruit. ? Avoid raw fruits. ? Avoid vegetables.  1-3 days before the procedure - follow a clear liquid diet. Avoid liquids that have red or purple dye. Drink only clear liquids, such as: ? Clear broth or bouillon. ? Black coffee or tea. ? Clear juice. ? Clear soft drinks or sports drinks. ? Gelatin dessert. ? Popsicles.  On the day of the procedure - do not eat or drink anything during the 2 hours before the procedure.  Bowel prep If you were prescribed an oral bowel prep:  Take it as told by your doctor. Starting the day before your procedure, you will need to drink a lot of liquid. The liquid will cause you to poop (have bowel movements) until your poop is almost clear or light green.  If your skin or butt gets irritated from diarrhea, you may: ? Wipe the area with wipes that have medicine in them, such as adult wet wipes with aloe and vitamin E. ? Put something on your skin that soothes the area, such as petroleum jelly.  If you throw up (vomit) while drinking the bowel prep, take a break for up to 60 minutes. Then begin the bowel prep again. If  you keep throwing up and you cannot take the bowel prep without throwing up, call your doctor.  General instructions  Ask your doctor about changing or stopping your normal medicines. This is important if you take diabetes medicines or blood thinners.  Plan to have someone take you home from the hospital or clinic. What happens during the procedure?  An IV tube may be put into one of your veins.  You will be given medicine to help you relax (sedative).  To reduce your risk of infection: ? Your doctors will wash their hands. ? Your anal area will be washed with soap.  You will be asked to lie on your side with your knees bent.  Your doctor will get a long, thin, flexible tube ready. The tube will have a camera and a  light on the end.  The tube will be put into your anus.  The tube will be gently put into your large intestine.  Air will be delivered into your large intestine to keep it open. You may feel some pressure or cramping.  The camera will be used to take photos.  A small tissue sample may be removed from your body to be looked at under a microscope (biopsy). If any possible problems are found, the tissue will be sent to a lab for testing.  If small growths are found, your doctor may remove them and have them checked for cancer.  The tube that was put into your anus will be slowly removed. The procedure may vary among doctors and hospitals. What happens after the procedure?  Your doctor will check on you often until the medicines you were given have worn off.  Do not drive for 24 hours after the procedure.  You may have a small amount of blood in your poop.  You may pass gas.  You may have mild cramps or bloating in your belly (abdomen).  It is up to you to get the results of your procedure. Ask your doctor, or the department performing the procedure, when your results will be ready. This information is not intended to replace advice given to you by your health care provider. Make sure you discuss any questions you have with your health care provider. Document Released: 09/19/2010 Document Revised: 06/17/2016 Document Reviewed: 10/29/2015 Elsevier Interactive Patient Education  2017 [employer]  Colonoscopy, Adult, Care After This sheet gives you information about how to care for yourself after your procedure. Your health care provider may also give you more specific instructions. If you have problems or questions, contact your health care provider. What can I expect after the procedure? After the procedure, it is common to have:  A small amount of blood in your stool for 24 hours after the procedure.  Some gas.  Mild abdominal cramping or bloating.  Follow these  instructions at home: General instructions   For the first 24 hours after the procedure: ? Do not drive or use machinery. ? Do not sign important documents. ? Do not drink alcohol. ? Do your regular daily activities at a slower pace than normal. ? Eat soft, easy-to-digest foods. ? Rest often.  Take over-the-counter or prescription medicines only as told by your health care provider.  It is up to you to get the results of your procedure. Ask your health care provider, or the department performing the procedure, when your results will be ready. Relieving cramping and bloating  Try walking around when you have cramps or feel bloated.  Apply heat  to your abdomen as told by your health care provider. Use a heat source that your health care provider recommends, such as a moist heat pack or a heating pad. ? Place a towel between your skin and the heat source. ? Leave the heat on for 20-30 minutes. ? Remove the heat if your skin turns bright red. This is especially important if you are unable to feel pain, heat, or cold. You may have a greater risk of getting burned. Eating and drinking  Drink enough fluid to keep your urine clear or pale yellow.  Resume your normal diet as instructed by your health care provider. Avoid heavy or fried foods that are hard to digest.  Avoid drinking alcohol for as long as instructed by your health care provider. Contact a health care provider if:  You have blood in your stool 2-3 days after the procedure. Get help right away if:  You have more than a small spotting of blood in your stool.  You pass large blood clots in your stool.  Your abdomen is swollen.  You have nausea or vomiting.  You have a fever.  You have increasing abdominal pain that is not relieved with medicine. This information is not intended to replace advice given to you by your health care provider. Make sure you discuss any questions you have with your health care  provider. Document Released: 03/31/2004 Document Revised: 05/11/2016 Document Reviewed: 10/29/2015 Elsevier Interactive Patient Education  2018 [place] Anesthesia is a term that refers to techniques, procedures, and medicines that help a person stay safe and comfortable during a medical procedure. Monitored anesthesia care, or sedation, is one type of anesthesia. Your anesthesia specialist may recommend sedation if you will be having a procedure that does not require you to be unconscious, such as:  Cataract surgery.  A dental procedure.  A biopsy.  A colonoscopy.  During the procedure, you may receive a medicine to help you relax (sedative). There are three levels of sedation:  Mild sedation. At this level, you may feel awake and relaxed. You will be able to follow directions.  Moderate sedation. At this level, you will be sleepy. You may not remember the procedure.  Deep sedation. At this level, you will be asleep. You will not remember the procedure.  The more medicine you are given, the deeper your level of sedation will be. Depending on how you respond to the procedure, the anesthesia specialist may change your level of sedation or the type of anesthesia to fit your needs. An anesthesia specialist will monitor you closely during the procedure. Let your health care provider know about:  Any allergies you have.  All medicines you are taking, including vitamins, herbs, eye drops, creams, and over-the-counter medicines.  Any use of steroids (by mouth or as a cream).  Any problems you or family members have had with sedatives and anesthetic medicines.  Any blood disorders you have.  Any surgeries you have had.  Any medical conditions you have, such as sleep apnea.  Whether you are pregnant or may be pregnant.  Any use of cigarettes, alcohol, or street drugs. What are the risks? Generally, this is a safe procedure. However, problems may  occur, including:  Getting too much medicine (oversedation).  Nausea.  Allergic reaction to medicines.  Trouble breathing. If this happens, a breathing tube may be used to help with breathing. It will be removed when you are awake and breathing on your own.  Heart  trouble.  Lung trouble.  Before the procedure Staying hydrated Follow instructions from your health care provider about hydration, which may include:  Up to 2 hours before the procedure - you may continue to drink clear liquids, such as water, clear fruit juice, black coffee, and plain tea.  Eating and drinking restrictions Follow instructions from your health care provider about eating and drinking, which may include:  8 hours before the procedure - stop eating heavy meals or foods such as meat, fried foods, or fatty foods.  6 hours before the procedure - stop eating light meals or foods, such as toast or cereal.  6 hours before the procedure - stop drinking milk or drinks that contain milk.  2 hours before the procedure - stop drinking clear liquids.  Medicines Ask your health care provider about:  Changing or stopping your regular medicines. This is especially important if you are taking diabetes medicines or blood thinners.  Taking medicines such as aspirin and ibuprofen. These medicines can thin your blood. Do not take these medicines before your procedure if your health care provider instructs you not to.  Tests and exams  You will have a physical exam.  You may have blood tests done to show: ? How well your kidneys and liver are working. ? How well your blood can clot.  General instructions  Plan to have someone take you home from the hospital or clinic.  If you will be going home right after the procedure, plan to have someone with you for 24 hours.  What happens during the procedure?  Your blood pressure, heart rate, breathing, level of pain and overall condition will be monitored.  An IV  tube will be inserted into one of your veins.  Your anesthesia specialist will give you medicines as needed to keep you comfortable during the procedure. This may mean changing the level of sedation.  The procedure will be performed. After the procedure  Your blood pressure, heart rate, breathing rate, and blood oxygen level will be monitored until the medicines you were given have worn off.  Do not drive for 24 hours if you received a sedative.  You may: ? Feel sleepy, clumsy, or nauseous. ? Feel forgetful about what happened after the procedure. ? Have a sore throat if you had a breathing tube during the procedure. ? Vomit. This information is not intended to replace advice given to you by your health care provider. Make sure you discuss any questions you have with your health care provider. Document Released: 05/13/2005 Document Revised: 01/24/2016 Document Reviewed: 12/08/2015 Elsevier Interactive Patient Education  2018 [place], Care After These instructions provide you with information about caring for yourself after your procedure. Your health care provider may also give you more specific instructions. Your treatment has been planned according to current medical practices, but problems sometimes occur. Call your health care provider if you have any problems or questions after your procedure. What can I expect after the procedure? After your procedure, it is common to:  Feel sleepy for several hours.  Feel clumsy and have poor balance for several hours.  Feel forgetful about what happened after the procedure.  Have poor judgment for several hours.  Feel nauseous or vomit.  Have a sore throat if you had a breathing tube during the procedure.  Follow these instructions at home: For at least 24 hours after the procedure:   Do not: ? Participate in activities in which you could fall or  become injured. ? Drive. ? Use heavy  machinery. ? Drink alcohol. ? Take sleeping pills or medicines that cause drowsiness. ? Make important decisions or sign legal documents. ? Take care of children on your own.  Rest. Eating and drinking  Follow the diet that is recommended by your health care provider.  If you vomit, drink water, juice, or soup when you can drink without vomiting.  Make sure you have little or no nausea before eating solid foods. General instructions  Have a responsible adult stay with you until you are awake and alert.  Take over-the-counter and prescription medicines only as told by your health care provider.  If you smoke, do not smoke without supervision.  Keep all follow-up visits as told by your health care provider. This is important. Contact a health care provider if:  You keep feeling nauseous or you keep vomiting.  You feel light-headed.  You develop a rash.  You have a fever. Get help right away if:  You have trouble breathing. This information is not intended to replace advice given to you by your health care provider. Make sure you discuss any questions you have with your health care provider. Document Released: 12/08/2015 Document Revised: 04/08/2016 Document Reviewed: 12/08/2015 Elsevier Interactive Patient Education  [employer].

## 2018-03-28 ENCOUNTER — Ambulatory Visit (INDEPENDENT_AMBULATORY_CARE_PROVIDER_SITE_OTHER): Payer: Medicare HMO | Admitting: Women's Health

## 2018-03-28 ENCOUNTER — Encounter: Payer: Self-pay | Admitting: Women's Health

## 2018-03-28 ENCOUNTER — Other Ambulatory Visit: Payer: Self-pay

## 2018-03-28 VITALS — BP 118/75 | HR 107 | Ht 62.0 in | Wt 248.0 lb

## 2018-03-28 DIAGNOSIS — N898 Other specified noninflammatory disorders of vagina: Secondary | ICD-10-CM

## 2018-03-28 LAB — POCT WET PREP (WET MOUNT)
Clue Cells Wet Prep Whiff POC: NEGATIVE
Trichomonas Wet Prep HPF POC: ABSENT

## 2018-03-28 NOTE — Progress Notes (Signed)
   GYN VISIT Patient name: [name] MRN [id number]  Date of birth: July 20, 1961 Chief Complaint:   Vaginal Discharge (no odor)  History of Present Illness:   [name] is a 57 y.o. [id number] African American female being seen today for report of vaginal d/c x 1wk. Had sex on Friday, douched on Sat, improved some. No itching/odor/irritation.     Patient's last menstrual period was 01/15/2012. The current method of family planning is tubal ligation. Last pap 01/13/18. Results were:  neg w/ -HRHPV Review of Systems:   Pertinent items are noted in HPI Denies fever/chills, dizziness, headaches, visual disturbances, fatigue, shortness of breath, chest pain, abdominal pain, vomiting, abnormal vaginal discharge/itching/odor/irritation, problems with periods, bowel movements, urination, or intercourse unless otherwise stated above.  Pertinent History Reviewed:  Reviewed past medical,surgical, social, obstetrical and family history.  Reviewed problem list, medications and allergies. Physical Assessment:   Vitals:   03/28/18 1350  BP: 118/75  Pulse: (!) 107  Weight: 248 lb (112.5 kg)  Height: 5\' 2"  (1.575 m)  Body mass index is 45.36 kg/m.       Physical Examination:   General appearance: alert, well appearing, and in no distress  Mental status: alert, oriented to person, place, and time  Skin: warm & dry   Cardiovascular: normal heart rate noted  Respiratory: normal respiratory effort, no distress  Abdomen: soft, non-tender   Pelvic: VULVA: normal appearing vulva with no masses, tenderness or lesions, VAGINA: normal appearing vagina with normal color and discharge, no lesions, CERVIX: normal appearing cervix without discharge or lesions  Extremities: no edema   Results for orders placed or performed in visit on 03/28/18 (from the past 24 hour(s))  POCT Wet Prep [facility])   Collection Time: 03/28/18  2:13 PM  Result Value Ref Range   Source Wet Prep POC vaginal    WBC,  Wet Prep HPF POC none    Bacteria Wet Prep HPF POC Few Few   BACTERIA WET PREP MORPHOLOGY POC     Clue Cells Wet Prep HPF POC None None   Clue Cells Wet Prep Whiff POC Negative Whiff    Yeast Wet Prep HPF POC None None   KOH Wet Prep POC  None   Trichomonas Wet Prep HPF POC Absent Absent    Assessment & Plan:  1) Vaginal discharge> wet prep neg, will send gc/ct, avoid douching  Meds: No orders of the defined types were placed in this encounter.   Orders Placed This Encounter  Procedures  . GC/Chlamydia Probe Amp  . POCT Wet Prep [facility])    Return for after 5/16 for physical.  Roma Schanz CNM, [facility] 03/28/2018 2:14 PM

## 2018-03-29 ENCOUNTER — Encounter (HOSPITAL_COMMUNITY)
Admission: RE | Admit: 2018-03-29 | Discharge: 2018-03-29 | Disposition: A | Discharge status: Home / Self Care | Payer: Medicare HMO | Source: Ambulatory Visit | Attending: Gastroenterology | Admitting: Gastroenterology

## 2018-03-29 ENCOUNTER — Telehealth: Payer: Self-pay | Admitting: Gastroenterology

## 2018-03-29 ENCOUNTER — Encounter (HOSPITAL_COMMUNITY): Payer: Self-pay

## 2018-03-29 NOTE — Telephone Encounter (Signed)
Pt was a no show for her pre op and is scheduled with SF on 8/6. Multiple attempts to reach patient per short stay.

## 2018-03-30 LAB — GC/CHLAMYDIA PROBE AMP
Chlamydia trachomatis, NAA: NEGATIVE
Neisseria gonorrhoeae by PCR: NEGATIVE

## 2018-03-30 NOTE — Telephone Encounter (Signed)
Called pt, stated she had another appt yesterday. Gave her phone number to call endo scheduler to reschedule pre-op appt. Endo scheduler aware.

## 2018-03-30 NOTE — Telephone Encounter (Signed)
Endo scheduler called office, pt unable to reschedule pre-op appt prior to TCS d/t transportation. TCS w/Propofol w/SLF will need to be rescheduled.

## 2018-03-31 ENCOUNTER — Telehealth: Payer: Self-pay | Admitting: Gastroenterology

## 2018-03-31 NOTE — Telephone Encounter (Signed)
Pt called office to reschedule procedure. TCS w/Propofol w/SLF rescheduled to 06/14/18 at 9:00am. Endo scheduler informed.  Pre-op appt 06/07/18 at 11:00am. Called pt back and informed her of pre-op appt. Letter mailed with procedure instructions.

## 2018-03-31 NOTE — Telephone Encounter (Signed)
Pt called to reschedule her pre op and procedure. Please call her at [phone]

## 2018-03-31 NOTE — Telephone Encounter (Signed)
See other phone note, procedure rescheduled.

## 2018-05-25 ENCOUNTER — Encounter: Payer: Self-pay | Admitting: Gastroenterology

## 2018-05-25 ENCOUNTER — Ambulatory Visit (INDEPENDENT_AMBULATORY_CARE_PROVIDER_SITE_OTHER): Payer: Medicare HMO | Admitting: Gastroenterology

## 2018-05-25 ENCOUNTER — Telehealth: Payer: Self-pay | Admitting: Gastroenterology

## 2018-05-25 VITALS — BP 123/86 | HR 102 | Temp 97.9°F | Ht 62.0 in | Wt 244.4 lb

## 2018-05-25 DIAGNOSIS — Z1211 Encounter for screening for malignant neoplasm of colon: Secondary | ICD-10-CM

## 2018-05-25 DIAGNOSIS — K219 Gastro-esophageal reflux disease without esophagitis: Secondary | ICD-10-CM

## 2018-05-25 NOTE — Telephone Encounter (Signed)
Please call Dr Josephine Cables office regarding patient's medication. [phone]

## 2018-05-25 NOTE — Assessment & Plan Note (Signed)
Doing well with starting Protonix daily over past few months. No alarm signs. She does take Ibuprofen intermittently and has stayed on 325 mg aspirin since last year at time of knee surgery. I have had her stop 325 mg aspirin, and we are notifying Dr. Josephine Cables office. I have asked her to discuss with him if she needs 81 mg aspirin daily. Return in 1 year.

## 2018-05-25 NOTE — Patient Instructions (Signed)
Please stop the 325 milligram aspirin. We are letting Dr. Josephine Cables office know this. He may want you on just the 81 milligram aspirin.   Continue the Protonix daily, 30 minutes before breakfast. I am glad you are doing well!  Keep the colonoscopy appointment as already scheduled.  We will see you in 1 year or sooner if needed!  I enjoyed seeing you again today! As you know, I value our relationship and want to provide genuine, compassionate, and quality care. I welcome your feedback. If you receive a survey regarding your visit,  I greatly appreciate you taking time to fill this out. See you next time!  Annitta Needs, PhD, ANP-BC [facility]

## 2018-05-25 NOTE — Progress Notes (Addendum)
REVIEWED-NO ADDITIONAL RECOMMENDATIONS.  Referring Provider: Rosita Fire, MD Primary Care Physician:  Rosita Fire, MD  Primary GI: Dr. Oneida Alar   Chief Complaint  Patient presents with  . Gastroesophageal Reflux    f/u, scheduled for tcs 06/14/18    HPI:   [name] is a 57 y.o. female presenting today with a history of GERD, need for screening colonoscopy. She was last seen in May 2019 and started on Protonix due to GERD symptoms. She has been on aspirin 325 mg since her knee surgery last year, and she takes Ibuprofen for leg pain prn. She was asked to talk to her PCP regarding aspirin at last visit.   Still on 325 mg aspirin daily. Ibuprofen for leg pain. GERD much improved. Protonix once daily. No constipation/diarrhea, no rectal bleeding. She is already scheduled for colonoscopy in Oct 2019, instructions provided at last visit.   Past Medical History:  Diagnosis Date  . Arthritis   . Diabetes mellitus ([place])   . DOE (dyspnea on exertion)   . Eczema   . Glaucoma   . Hypercholesterolemia   . Hypertension   . Infection    r knee  . Muscle spasm of back   . Positive ANA (antinuclear antibody)    1:180 homogenous pattern    Past Surgical History:  Procedure Laterality Date  . IRRIGATION AND DEBRIDEMENT KNEE Right 05/12/2013   Procedure: IRRIGATION AND DEBRIDEMENT KNEE;  Surgeon: Carole Civil, MD;  Location: [facility];  Service: Orthopedics;  Laterality: Right;  . JOINT REPLACEMENT     r knee  . LIPOMA EXCISION  11/16/2011   Procedure: EXCISION LIPOMA;  Surgeon: Scherry Ran, MD;  Location: [facility];  Service: General;  Laterality: Left;  Excision of neoplasm left shoulder  . MOUTH SURGERY    . MULTIPLE EXTRACTIONS WITH ALVEOLOPLASTY  12/28/2011   Procedure: MULTIPLE EXTRACION WITH ALVEOLOPLASTY;  Surgeon: Gae Bon, DDS;  Location: [place];  Service: Oral Surgery;  Laterality: Bilateral;  . PATELLAR TENDON REPAIR Right 05/12/2013   Procedure: PATELLA  TENDON REPAIR AND ALLOGRAFT RECONSTRUCTION;  Surgeon: Carole Civil, MD;  Location: [facility];  Service: Orthopedics;  Laterality: Right;  . resection arthroplasty right total knee  01/28/16   Dr Starlyn Skeans, [facility]; antibiotic spacer placed  . SHOULDER SURGERY Left   . TOTAL KNEE ARTHROPLASTY Right 04/17/2013   Procedure: RIGHT TOTAL KNEE ARTHROPLASTY;  Surgeon: Carole Civil, MD;  [facility]: [facility];  Service: Orthopedics;  Laterality: Right;  . TOTAL KNEE ARTHROPLASTY Right 05/12/2013   Procedure: POLY EXCHANGE;  Surgeon: Carole Civil, MD;  Location: [facility];  Service: Orthopedics;  Laterality: Right;  . TUBAL LIGATION     and burned per patient.     Current Outpatient Medications  Medication Sig Dispense Refill  . aspirin 325 MG tablet Take 325 mg daily by mouth.     Marland Kitchen atorvastatin (LIPITOR) 20 MG tablet Take 20 mg by mouth daily.    . bisacodyl (WOMENS LAXATIVE) 5 MG EC tablet Take 5 mg daily as needed by mouth for moderate constipation.    . cetirizine (ZYRTEC) 10 MG tablet Take 10 mg by mouth daily.    . cyclobenzaprine (FLEXERIL) 10 MG tablet Take 10 mg by mouth as needed for muscle spasms.     . diphenhydrAMINE (BENADRYL) 25 mg capsule Take 25 mg every 8 (eight) hours as needed by mouth (for itching/skin crawling).     . IBU 800 MG tablet Take 800 mg  by mouth every 8 (eight) hours as needed for headache or moderate pain.     Marland Kitchen linagliptin (TRADJENTA) 5 MG TABS tablet Take 5 mg by mouth daily.    Marland Kitchen lisinopril-hydrochlorothiazide (PRINZIDE,ZESTORETIC) 20-12.5 MG tablet Take 1 tablet by mouth daily.    . metFORMIN (GLUCOPHAGE) 500 MG tablet Take 500 mg 2 (two) times daily by mouth.     . pantoprazole (PROTONIX) 40 MG tablet Take 1 tablet (40 mg total) by mouth daily. Take 30 minutes before breakfast daily 90 tablet 3  . travoprost, benzalkonium, (TRAVATAN) 0.004 % ophthalmic solution Place 1 drop into both eyes at bedtime.     Marland Kitchen zolpidem (AMBIEN) 10 MG tablet Take 1 tablet  (10 mg total) by mouth at bedtime as needed for sleep. (Patient taking differently: Take 10 mg at bedtime by mouth. ) 30 tablet 0   No current facility-administered medications for this visit.     Allergies as of 05/25/2018 - Review Complete 05/25/2018  Allergen Reaction Noted  . Amoxicillin Swelling and Other (See Comments) 03/22/2018  . Chocolate Hives 11/13/2011  . Nystatin Anxiety 02/06/2016    Family History  Problem Relation Age of Onset  . Diabetes Brother   . Diabetes Mother   . Multiple sclerosis Sister   . Arthritis Neg Hx   . Anesthesia problems Neg Hx   . Hypotension Neg Hx   . Malignant hyperthermia Neg Hx   . Pseudochol deficiency Neg Hx   . Cancer Neg Hx   . Heart disease Neg Hx   . Stroke Neg Hx   . Colon cancer Neg Hx   . Colon polyps Neg Hx     Social History   Socioeconomic History  . Marital status: Single    Spouse name: Not on file  . Number of children: Not on file  . Years of education: 76  . Highest education level: Not on file  Occupational History    Employer: NOT EMPLOYED  Social Needs  . Financial resource strain: Not on file  . Food insecurity:    Worry: Not on file    Inability: Not on file  . Transportation needs:    Medical: Not on file    Non-medical: Not on file  Tobacco Use  . Smoking status: Current Some Day Smoker    Packs/day: 0.10    Years: 1.00    Pack years: 0.10    Types: Cigarettes    Last attempt to quit: 01/28/2016    Years since quitting: 2.3  . Smokeless tobacco: Never Used  Substance and Sexual Activity  . Alcohol use: Yes    Alcohol/week: 0.0 standard drinks    Comment: "once every blue moon"   . Drug use: No  . Sexual activity: Not Currently    Birth control/protection: None  Lifestyle  . Physical activity:    Days per week: Not on file    Minutes per session: Not on file  . Stress: Not on file  Relationships  . Social connections:    Talks on phone: Not on file    Gets together: Not on file     Attends religious service: Not on file    Active member of club or organization: Not on file    Attends meetings of clubs or organizations: Not on file    Relationship status: Not on file  Other Topics Concern  . Not on file  Social History Narrative  . Not on file    Review of Systems: Gen:  Denies fever, chills, anorexia. Denies fatigue, weakness, weight loss.  CV: Denies chest pain, palpitations, syncope, peripheral edema, and claudication. Resp: Denies dyspnea at rest, cough, wheezing, coughing up blood, and pleurisy. GI: see HPI  Derm: Denies rash, itching, dry skin Psych: Denies depression, anxiety, memory loss, confusion. No homicidal or suicidal ideation.  Heme: Denies bruising, bleeding, and enlarged lymph nodes.  Physical Exam: BP 123/86   Pulse (!) 102   Temp 97.9 F (36.6 C) (Oral)   Ht 5\' 2"  (1.575 m)   Wt 244 lb 6.4 oz (110.9 kg)   LMP 01/15/2012   BMI 44.70 kg/m  General:   Alert and oriented. No distress noted. Pleasant and cooperative.  Head:  Normocephalic and atraumatic. Eyes:  Conjuctiva clear without scleral icterus. Mouth:  Oral mucosa pink and moist.  Lungs: clear to auscultation bilaterally  Cardiac: S1 S2 present without murmurs  Abdomen:  +BS, soft, non-tender and non-distended. No rebound or guarding. No HSM or masses noted. Msk:  Symmetrical without gross deformities. Normal posture. Extremities:  Without edema. Neurologic:  Alert and  oriented x4 Psych:  Alert and cooperative. Normal mood and affect.

## 2018-05-25 NOTE — Progress Notes (Signed)
CC'D TO PCP °

## 2018-05-25 NOTE — Assessment & Plan Note (Addendum)
Due for initial screening colonoscopy.  Proceed with colonoscopy with Dr. Oneida Alar in the near future. The risks, benefits, and alternatives have been discussed in detail with the patient. They state understanding and desire to proceed.  Propofol due to polypharmacy

## 2018-05-25 NOTE — Telephone Encounter (Signed)
Office received my fax and will consult with Dr. Legrand Rams. Dr. Josephine Cables nurse said it would be ok to d/c the 325 mg but we wanted to make sure this is ok with Dr. Legrand Rams and if pt will need to take the 81 mg Asprin. They will contact pt.

## 2018-06-01 ENCOUNTER — Encounter: Payer: Self-pay | Admitting: Orthopaedic Surgery

## 2018-06-01 ENCOUNTER — Ambulatory Visit (INDEPENDENT_AMBULATORY_CARE_PROVIDER_SITE_OTHER): Payer: Medicare Other

## 2018-06-01 ENCOUNTER — Ambulatory Visit (INDEPENDENT_AMBULATORY_CARE_PROVIDER_SITE_OTHER): Payer: Medicare Other | Admitting: Orthopaedic Surgery

## 2018-06-01 VITALS — BP 134/90 | HR 109 | Ht 62.0 in | Wt 243.0 lb

## 2018-06-01 DIAGNOSIS — M25512 Pain in left shoulder: Secondary | ICD-10-CM

## 2018-06-01 DIAGNOSIS — Z6841 Body Mass Index (BMI) 40.0 and over, adult: Secondary | ICD-10-CM

## 2018-06-01 DIAGNOSIS — G8929 Other chronic pain: Secondary | ICD-10-CM | POA: Diagnosis not present

## 2018-06-01 NOTE — Progress Notes (Addendum)
Patient LD:[id number] [name], female DOB:June 14, 1961, 57 y.o. JQZ:[id number]  Chief Complaint  Patient presents with  . Shoulder Pain    Left shoulder for 2-3 months    HPI  [name] is a 57 y.o. female who has developed left shoulder pain over the last month or so.  She has no falls or trauma.  She has pain raising her arm over her head and rolling over on it at night.  She has no swelling, no redness, no numbness.  She has seen Dr. Legrand Rams for this.  She takes ibuprofen which helps.  She has tried heat which helps also.   Body mass index is 44.45 kg/m.   The patient meets the AMA guidelines for Morbid (severe) obesity with a BMI > 40.0 and I have recommended weight loss.   ROS  Review of Systems  Constitutional: Positive for activity change.  Musculoskeletal: Positive for arthralgias.  All other systems reviewed and are negative.  For Review of Systems, all other systems reviewed and are negative.  The following is a summary of the past history medically, past history surgically, known current medicines, social history and family history.  This information is gathered electronically by the computer from prior information and documentation.  I review this each visit and have found including this information at this point in the chart is beneficial and informative.   Past Medical History:  Diagnosis Date  . Arthritis   . Diabetes mellitus ([place])   . DOE (dyspnea on exertion)   . Eczema   . Glaucoma   . Hypercholesterolemia   . Hypertension   . Infection    r knee  . Muscle spasm of back   . Positive ANA (antinuclear antibody)    1:180 homogenous pattern    Past Surgical History:  Procedure Laterality Date  . IRRIGATION AND DEBRIDEMENT KNEE Right 05/12/2013   Procedure: IRRIGATION AND DEBRIDEMENT KNEE;  Surgeon: Carole Civil, MD;  Location: [facility];  Service: Orthopedics;  Laterality: Right;  . JOINT REPLACEMENT     r knee  . LIPOMA EXCISION  11/16/2011    Procedure: EXCISION LIPOMA;  Surgeon: Scherry Ran, MD;  Location: [facility];  Service: General;  Laterality: Left;  Excision of neoplasm left shoulder  . MOUTH SURGERY    . MULTIPLE EXTRACTIONS WITH ALVEOLOPLASTY  12/28/2011   Procedure: MULTIPLE EXTRACION WITH ALVEOLOPLASTY;  Surgeon: Gae Bon, DDS;  Location: [place];  Service: Oral Surgery;  Laterality: Bilateral;  . PATELLAR TENDON REPAIR Right 05/12/2013   Procedure: PATELLA TENDON REPAIR AND ALLOGRAFT RECONSTRUCTION;  Surgeon: Carole Civil, MD;  Location: [facility];  Service: Orthopedics;  Laterality: Right;  . resection arthroplasty right total knee  01/28/16   Dr Starlyn Skeans, [facility]; antibiotic spacer placed  . SHOULDER SURGERY Left   . TOTAL KNEE ARTHROPLASTY Right 04/17/2013   Procedure: RIGHT TOTAL KNEE ARTHROPLASTY;  Surgeon: Carole Civil, MD;  Location: [facility];  Service: Orthopedics;  Laterality: Right;  . TOTAL KNEE ARTHROPLASTY Right 05/12/2013   Procedure: POLY EXCHANGE;  Surgeon: Carole Civil, MD;  Location: [facility];  Service: Orthopedics;  Laterality: Right;  . TUBAL LIGATION     and burned per patient.     Current Outpatient Medications on File Prior to Visit  Medication Sig Dispense Refill  . aspirin 325 MG tablet Take 325 mg daily by mouth.     Marland Kitchen atorvastatin (LIPITOR) 20 MG tablet Take 20 mg by mouth daily.    Marland Kitchen  bisacodyl (WOMENS LAXATIVE) 5 MG EC tablet Take 5 mg daily as needed by mouth for moderate constipation.    . cetirizine (ZYRTEC) 10 MG tablet Take 10 mg by mouth daily.    . cyclobenzaprine (FLEXERIL) 10 MG tablet Take 10 mg by mouth as needed for muscle spasms.     . diphenhydrAMINE (BENADRYL) 25 mg capsule Take 25 mg every 8 (eight) hours as needed by mouth (for itching/skin crawling).     . IBU 800 MG tablet Take 800 mg by mouth every 8 (eight) hours as needed for headache or moderate pain.     Marland Kitchen linagliptin (TRADJENTA) 5 MG TABS tablet Take 5 mg by mouth daily.    Marland Kitchen  lisinopril-hydrochlorothiazide (PRINZIDE,ZESTORETIC) 20-12.5 MG tablet Take 1 tablet by mouth daily.    . metFORMIN (GLUCOPHAGE) 500 MG tablet Take 500 mg 2 (two) times daily by mouth.     . pantoprazole (PROTONIX) 40 MG tablet Take 1 tablet (40 mg total) by mouth daily. Take 30 minutes before breakfast daily 90 tablet 3  . travoprost, benzalkonium, (TRAVATAN) 0.004 % ophthalmic solution Place 1 drop into both eyes at bedtime.     Marland Kitchen zolpidem (AMBIEN) 10 MG tablet Take 1 tablet (10 mg total) by mouth at bedtime as needed for sleep. (Patient taking differently: Take 10 mg at bedtime by mouth. ) 30 tablet 0   No current facility-administered medications on file prior to visit.     Social History   Socioeconomic History  . Marital status: Single    Spouse name: Not on file  . Number of children: Not on file  . Years of education: 18  . Highest education level: Not on file  Occupational History    Employer: NOT EMPLOYED  Social Needs  . Financial resource strain: Not on file  . Food insecurity:    Worry: Not on file    Inability: Not on file  . Transportation needs:    Medical: Not on file    Non-medical: Not on file  Tobacco Use  . Smoking status: Current Some Day Smoker    Packs/day: 0.10    Years: 1.00    Pack years: 0.10    Types: Cigarettes    Last attempt to quit: 01/28/2016    Years since quitting: 2.3  . Smokeless tobacco: Never Used  Substance and Sexual Activity  . Alcohol use: Yes    Alcohol/week: 0.0 standard drinks    Comment: "once every blue moon"   . Drug use: No  . Sexual activity: Not Currently    Birth control/protection: None  Lifestyle  . Physical activity:    Days per week: Not on file    Minutes per session: Not on file  . Stress: Not on file  Relationships  . Social connections:    Talks on phone: Not on file    Gets together: Not on file    Attends religious service: Not on file    Active member of club or organization: Not on file    Attends  meetings of clubs or organizations: Not on file    Relationship status: Not on file  . Intimate partner violence:    Fear of current or ex partner: Not on file    Emotionally abused: Not on file    Physically abused: Not on file    Forced sexual activity: Not on file  Other Topics Concern  . Not on file  Social History Narrative  . Not on file  Family History  Problem Relation Age of Onset  . Diabetes Brother   . Diabetes Mother   . Multiple sclerosis Sister   . Arthritis Neg Hx   . Anesthesia problems Neg Hx   . Hypotension Neg Hx   . Malignant hyperthermia Neg Hx   . Pseudochol deficiency Neg Hx   . Cancer Neg Hx   . Heart disease Neg Hx   . Stroke Neg Hx   . Colon cancer Neg Hx   . Colon polyps Neg Hx     BP 134/90   Pulse (!) 109   Ht 5\' 2"  (1.575 m)   Wt 243 lb (110.2 kg)   LMP 01/15/2012   BMI 44.45 kg/m   Body mass index is 44.45 kg/m.   All other systems reviewed and are negative.  The following is a summary of the past history medically, past history surgically, known current medicines, social history and family history.  This information is gathered electronically by the computer from prior information and documentation.  I review this each visit and have found including this information at this point in the chart is beneficial and informative.    Past Medical History:  Diagnosis Date  . Arthritis   . Diabetes mellitus ([place])   . DOE (dyspnea on exertion)   . Eczema   . Glaucoma   . Hypercholesterolemia   . Hypertension   . Infection    r knee  . Muscle spasm of back   . Positive ANA (antinuclear antibody)    1:180 homogenous pattern    Past Surgical History:  Procedure Laterality Date  . IRRIGATION AND DEBRIDEMENT KNEE Right 05/12/2013   Procedure: IRRIGATION AND DEBRIDEMENT KNEE;  Surgeon: Carole Civil, MD;  [facility];  Service: Orthopedics;  Laterality: Right;  . JOINT REPLACEMENT     r knee  . LIPOMA EXCISION  11/16/2011    Procedure: EXCISION LIPOMA;  Surgeon: Scherry Ran, MD;  [facility];  Service: General;  Laterality: Left;  Excision of neoplasm left shoulder  . MOUTH SURGERY    . MULTIPLE EXTRACTIONS WITH ALVEOLOPLASTY  12/28/2011   Procedure: MULTIPLE EXTRACION WITH ALVEOLOPLASTY;  Surgeon: Gae Bon, DDS;  Location: [place];  Service: Oral Surgery;  Laterality: Bilateral;  . PATELLAR TENDON REPAIR Right 05/12/2013   Procedure: PATELLA TENDON REPAIR AND ALLOGRAFT RECONSTRUCTION;  Surgeon: Carole Civil, MD;  Location: [facility];  Service: Orthopedics;  Laterality: Right;  . resection arthroplasty right total knee  01/28/16   Dr Starlyn Skeans, [facility]; antibiotic spacer placed  . SHOULDER SURGERY Left   . TOTAL KNEE ARTHROPLASTY Right 04/17/2013   Procedure: RIGHT TOTAL KNEE ARTHROPLASTY;  Surgeon: Carole Civil, MD;  Location: [facility];  Service: Orthopedics;  Laterality: Right;  . TOTAL KNEE ARTHROPLASTY Right 05/12/2013   Procedure: POLY EXCHANGE;  Surgeon: Carole Civil, MD;  Location: [facility];  Service: Orthopedics;  Laterality: Right;  . TUBAL LIGATION     and burned per patient.     Family History  Problem Relation Age of Onset  . Diabetes Brother   . Diabetes Mother   . Multiple sclerosis Sister   . Arthritis Neg Hx   . Anesthesia problems Neg Hx   . Hypotension Neg Hx   . Malignant hyperthermia Neg Hx   . Pseudochol deficiency Neg Hx   . Cancer Neg Hx   . Heart disease Neg Hx   . Stroke Neg Hx   . Colon cancer Neg  Hx   . Colon polyps Neg Hx     Social History Social History   Tobacco Use  . Smoking status: Current Some Day Smoker    Packs/day: 0.10    Years: 1.00    Pack years: 0.10    Types: Cigarettes    Last attempt to quit: 01/28/2016    Years since quitting: 2.3  . Smokeless tobacco: Never Used  Substance Use Topics  . Alcohol use: Yes    Alcohol/week: 0.0 standard drinks    Comment: "once every blue moon"   . Drug use: No    Allergies   Allergen Reactions  . Amoxicillin Swelling and Other (See Comments)    Shaking, stomach upset, felt like face was swelling  . Chocolate Hives  . Nystatin Anxiety    Current Outpatient Medications  Medication Sig Dispense Refill  . aspirin 325 MG tablet Take 325 mg daily by mouth.     Marland Kitchen atorvastatin (LIPITOR) 20 MG tablet Take 20 mg by mouth daily.    . bisacodyl (WOMENS LAXATIVE) 5 MG EC tablet Take 5 mg daily as needed by mouth for moderate constipation.    . cetirizine (ZYRTEC) 10 MG tablet Take 10 mg by mouth daily.    . cyclobenzaprine (FLEXERIL) 10 MG tablet Take 10 mg by mouth as needed for muscle spasms.     . diphenhydrAMINE (BENADRYL) 25 mg capsule Take 25 mg every 8 (eight) hours as needed by mouth (for itching/skin crawling).     . IBU 800 MG tablet Take 800 mg by mouth every 8 (eight) hours as needed for headache or moderate pain.     Marland Kitchen linagliptin (TRADJENTA) 5 MG TABS tablet Take 5 mg by mouth daily.    Marland Kitchen lisinopril-hydrochlorothiazide (PRINZIDE,ZESTORETIC) 20-12.5 MG tablet Take 1 tablet by mouth daily.    . metFORMIN (GLUCOPHAGE) 500 MG tablet Take 500 mg 2 (two) times daily by mouth.     . pantoprazole (PROTONIX) 40 MG tablet Take 1 tablet (40 mg total) by mouth daily. Take 30 minutes before breakfast daily 90 tablet 3  . travoprost, benzalkonium, (TRAVATAN) 0.004 % ophthalmic solution Place 1 drop into both eyes at bedtime.     Marland Kitchen zolpidem (AMBIEN) 10 MG tablet Take 1 tablet (10 mg total) by mouth at bedtime as needed for sleep. (Patient taking differently: Take 10 mg at bedtime by mouth. ) 30 tablet 0   No current facility-administered medications for this visit.      Physical Exam  Blood pressure 134/90, pulse (!) 109, height 5\' 2"  (1.575 m), weight 243 lb (110.2 kg), last menstrual period 01/15/2012.  Constitutional: overall normal hygiene, normal nutrition, well developed, normal grooming, normal body habitus. Assistive device:cane  Musculoskeletal: gait and  station Limp right, muscle tone and strength are normal, no tremors or atrophy is present.  .  Neurological: coordination overall normal.  Deep tendon reflex/nerve stretch intact.  Sensation normal.  Cranial nerves II-XII intact.   Skin:   Normal overall no scars, lesions, ulcers or rashes. No psoriasis.  Psychiatric: Alert and oriented x 3.  Recent memory intact, remote memory unclear.  Normal mood and affect. Well groomed.  Good eye contact.  Cardiovascular: overall no swelling, no varicosities, no edema bilaterally, normal temperatures of the legs and arms, no clubbing, cyanosis and good capillary refill.  Lymphatic: palpation is normal.  Examination of left Upper Extremity is done.  Inspection:   Overall:  Elbow non-tender without crepitus or defects, forearm non-tender without crepitus or  defects, wrist non-tender without crepitus or defects, hand non-tender.    Shoulder: with glenohumeral joint tenderness, without effusion.   Upper arm: without swelling and tenderness   Range of motion:   Overall:  Full range of motion of the elbow, full range of motion of wrist and full range of motion in fingers.   Shoulder:  left  160 degrees forward flexion; 120 degrees abduction; 35 degrees internal rotation, 35 degrees external rotation, 10 degrees extension, 40 degrees adduction.   Stability:   Overall:  Shoulder, elbow and wrist stable   Strength and Tone:   Overall full shoulder muscles strength, full upper arm strength and normal upper arm bulk and tone.  All other systems reviewed and are negative   The patient has been educated about the nature of the problem(s) and counseled on treatment options.  The patient appeared to understand what I have discussed and is in agreement with it.  X-rays were done of the left shoulder, reported separately.  Encounter Diagnoses  Name Primary?  . Chronic left shoulder pain Yes  . Body mass index 40.0-44.9, adult ([place])   . Morbid obesity ([place])      PROCEDURE NOTE:  The patient request injection, verbal consent was obtained.  The left shoulder was prepped appropriately after time out was performed.   Sterile technique was observed and injection of 1 cc of Depo-Medrol 40 mg with several cc's of plain xylocaine. Anesthesia was provided by ethyl chloride and a 20-gauge needle was used to inject the shoulder area. A posterior approach was used.  The injection was tolerated well.  A band aid dressing was applied.  The patient was advised to apply ice later today and tomorrow to the injection sight as needed.   PLAN Call if any problems.  Precautions discussed.  Continue current medications.   Return to clinic 2 weeks   Electronically Signed Sanjuana Kava, MD 10/2/20199:58 AM

## 2018-06-02 NOTE — Patient Instructions (Signed)
   Your procedure is scheduled on: 06/14/2018   Report to [facility] at  7:45   AM.  Call this number if you have problems the morning of surgery: [phone]   Remember:              Follow Directions on the letter you received from Your Physician's office regarding the Bowel Prep  :  Take these medicines the morning of surgery with A SIP OF WATER: Certirizine, Pantoprazole and Flexeril if needed   Do not wear jewelry, make-up or nail polish.    Do not bring valuables to the hospital.  Contacts, dentures or bridgework may not be worn into surgery.  .   Patients discharged the day of surgery will not be allowed to drive home.     Colonoscopy, Adult, Care After This sheet gives you information about how to care for yourself after your procedure. Your health care provider may also give you more specific instructions. If you have problems or questions, contact your health care provider. What can I expect after the procedure? After the procedure, it is common to have:  A small amount of blood in your stool for 24 hours after the procedure.  Some gas.  Mild abdominal cramping or bloating.  Follow these instructions at home: General instructions   For the first 24 hours after the procedure: ? Do not drive or use machinery. ? Do not sign important documents. ? Do not drink alcohol. ? Do your regular daily activities at a slower pace than normal. ? Eat soft, easy-to-digest foods. ? Rest often.  Take over-the-counter or prescription medicines only as told by your health care provider.  It is up to you to get the results of your procedure. Ask your health care provider, or the department performing the procedure, when your results will be ready. Relieving cramping and bloating  Try walking around when you have cramps or feel bloated.  Apply heat to your abdomen as told by your health care provider. Use a heat source that your health care provider recommends, such as a moist  heat pack or a heating pad. ? Place a towel between your skin and the heat source. ? Leave the heat on for 20-30 minutes. ? Remove the heat if your skin turns bright red. This is especially important if you are unable to feel pain, heat, or cold. You may have a greater risk of getting burned. Eating and drinking  Drink enough fluid to keep your urine clear or pale yellow.  Resume your normal diet as instructed by your health care provider. Avoid heavy or fried foods that are hard to digest.  Avoid drinking alcohol for as long as instructed by your health care provider. Contact a health care provider if:  You have blood in your stool 2-3 days after the procedure. Get help right away if:  You have more than a small spotting of blood in your stool.  You pass large blood clots in your stool.  Your abdomen is swollen.  You have nausea or vomiting.  You have a fever.  You have increasing abdominal pain that is not relieved with medicine. This information is not intended to replace advice given to you by your health care provider. Make sure you discuss any questions you have with your health care provider. Document Released: 03/31/2004 Document Revised: 05/11/2016 Document Reviewed: 10/29/2015 Elsevier Interactive Patient Education  [employer].

## 2018-06-07 ENCOUNTER — Other Ambulatory Visit: Payer: Self-pay

## 2018-06-07 ENCOUNTER — Encounter (HOSPITAL_COMMUNITY)
Admission: RE | Admit: 2018-06-07 | Discharge: 2018-06-07 | Disposition: A | Discharge status: Home / Self Care | Payer: Medicare Other | Source: Ambulatory Visit | Attending: Gastroenterology | Admitting: Gastroenterology

## 2018-06-07 ENCOUNTER — Encounter (HOSPITAL_COMMUNITY): Payer: Self-pay

## 2018-06-07 DIAGNOSIS — I10 Essential (primary) hypertension: Secondary | ICD-10-CM | POA: Diagnosis not present

## 2018-06-07 DIAGNOSIS — Z01818 Encounter for other preprocedural examination: Secondary | ICD-10-CM | POA: Diagnosis not present

## 2018-06-07 HISTORY — DX: Other muscle spasm: M62.838

## 2018-06-07 HISTORY — DX: Other specified postprocedural states: R11.2

## 2018-06-07 HISTORY — DX: Other complications of anesthesia, initial encounter: T88.59XA

## 2018-06-07 HISTORY — DX: Other specified postprocedural states: Z98.890

## 2018-06-07 HISTORY — DX: Adverse effect of unspecified anesthetic, initial encounter: T41.45XA

## 2018-06-07 LAB — BASIC METABOLIC PANEL
ANION GAP: 7 (ref 5–15)
BUN: 18 mg/dL (ref 6–20)
CHLORIDE: 105 mmol/L (ref 98–111)
CO2: 25 mmol/L (ref 22–32)
CREATININE: 0.88 mg/dL (ref 0.44–1.00)
Calcium: 9.3 mg/dL (ref 8.9–10.3)
GFR calc non Af Amer: >60 mL/min (ref >60)
Glucose, Bld: 108 mg/dL — ABNORMAL HIGH (ref 70–99)
Potassium: 4.1 mmol/L (ref 3.5–5.1)
SODIUM: 137 mmol/L (ref 135–145)

## 2018-06-07 LAB — CBC WITH DIFFERENTIAL/PLATELET
Abs Immature Granulocytes: 0.06 10*3/uL (ref 0.00–0.07)
BASOS ABS: 0.1 10*3/uL (ref 0.0–0.1)
Basophils Relative: 1 %
EOS ABS: 0.2 10*3/uL (ref 0.0–0.5)
Eosinophils Relative: 2 %
HCT: 39.4 % (ref 36.0–46.0)
Hemoglobin: 12.1 g/dL (ref 12.0–15.0)
IMMATURE GRANULOCYTES: 1 %
LYMPHS PCT: 24 %
Lymphs Abs: 3.1 10*3/uL (ref 0.7–4.0)
MCH: 25.3 pg — ABNORMAL LOW (ref 26.0–34.0)
MCHC: 30.7 g/dL (ref 30.0–36.0)
MCV: 82.3 fL (ref 80.0–100.0)
Monocytes Absolute: 1.1 10*3/uL — ABNORMAL HIGH (ref 0.1–1.0)
Monocytes Relative: 8 %
NEUTROS PCT: 64 %
NRBC: 0 % (ref 0.0–0.2)
Neutro Abs: 8.3 10*3/uL — ABNORMAL HIGH (ref 1.7–7.7)
PLATELETS: 333 10*3/uL (ref 150–400)
RBC: 4.79 MIL/uL (ref 3.87–5.11)
RDW: 17 % — AB (ref 11.5–15.5)
WBC: 12.9 10*3/uL — AB (ref 4.0–10.5)

## 2018-06-07 LAB — SURGICAL PCR SCREEN
MRSA, PCR: NEGATIVE
STAPHYLOCOCCUS AUREUS: NEGATIVE

## 2018-06-14 ENCOUNTER — Telehealth: Payer: Self-pay | Admitting: Gastroenterology

## 2018-06-14 ENCOUNTER — Ambulatory Visit (HOSPITAL_COMMUNITY)
Admission: RE | Admit: 2018-06-14 | Discharge: 2018-06-14 | Disposition: A | Discharge status: Home / Self Care | Payer: Medicare Other | Source: Ambulatory Visit | Attending: Gastroenterology | Admitting: Gastroenterology

## 2018-06-14 ENCOUNTER — Other Ambulatory Visit: Payer: Self-pay

## 2018-06-14 ENCOUNTER — Emergency Department (HOSPITAL_COMMUNITY)
Admission: EM | Admit: 2018-06-14 | Discharge: 2018-06-14 | Disposition: A | Discharge status: Home / Self Care | Payer: Medicare Other | Source: Home / Self Care | Attending: Emergency Medicine | Admitting: Emergency Medicine

## 2018-06-14 ENCOUNTER — Encounter (HOSPITAL_COMMUNITY)
Admission: RE | Disposition: A | Discharge status: Home / Self Care | Payer: Self-pay | Source: Ambulatory Visit | Attending: Gastroenterology

## 2018-06-14 ENCOUNTER — Encounter (HOSPITAL_COMMUNITY): Payer: Self-pay | Admitting: Gastroenterology

## 2018-06-14 ENCOUNTER — Ambulatory Visit (HOSPITAL_COMMUNITY): Payer: Medicare Other | Admitting: Anesthesiology

## 2018-06-14 ENCOUNTER — Encounter (HOSPITAL_COMMUNITY): Payer: Self-pay

## 2018-06-14 DIAGNOSIS — Z7984 Long term (current) use of oral hypoglycemic drugs: Secondary | ICD-10-CM | POA: Insufficient documentation

## 2018-06-14 DIAGNOSIS — Z96651 Presence of right artificial knee joint: Secondary | ICD-10-CM | POA: Diagnosis not present

## 2018-06-14 DIAGNOSIS — K621 Rectal polyp: Secondary | ICD-10-CM | POA: Diagnosis not present

## 2018-06-14 DIAGNOSIS — K648 Other hemorrhoids: Secondary | ICD-10-CM | POA: Diagnosis not present

## 2018-06-14 DIAGNOSIS — K635 Polyp of colon: Secondary | ICD-10-CM | POA: Diagnosis not present

## 2018-06-14 DIAGNOSIS — Z888 Allergy status to other drugs, medicaments and biological substances status: Secondary | ICD-10-CM | POA: Insufficient documentation

## 2018-06-14 DIAGNOSIS — F17210 Nicotine dependence, cigarettes, uncomplicated: Secondary | ICD-10-CM

## 2018-06-14 DIAGNOSIS — D125 Benign neoplasm of sigmoid colon: Secondary | ICD-10-CM | POA: Diagnosis not present

## 2018-06-14 DIAGNOSIS — T7840XA Allergy, unspecified, initial encounter: Secondary | ICD-10-CM

## 2018-06-14 DIAGNOSIS — H409 Unspecified glaucoma: Secondary | ICD-10-CM | POA: Insufficient documentation

## 2018-06-14 DIAGNOSIS — E119 Type 2 diabetes mellitus without complications: Secondary | ICD-10-CM | POA: Insufficient documentation

## 2018-06-14 DIAGNOSIS — I10 Essential (primary) hypertension: Secondary | ICD-10-CM | POA: Insufficient documentation

## 2018-06-14 DIAGNOSIS — E7800 Pure hypercholesterolemia, unspecified: Secondary | ICD-10-CM | POA: Diagnosis not present

## 2018-06-14 DIAGNOSIS — D127 Benign neoplasm of rectosigmoid junction: Secondary | ICD-10-CM | POA: Diagnosis not present

## 2018-06-14 DIAGNOSIS — K5730 Diverticulosis of large intestine without perforation or abscess without bleeding: Secondary | ICD-10-CM | POA: Insufficient documentation

## 2018-06-14 DIAGNOSIS — Z1211 Encounter for screening for malignant neoplasm of colon: Secondary | ICD-10-CM | POA: Insufficient documentation

## 2018-06-14 DIAGNOSIS — R600 Localized edema: Secondary | ICD-10-CM

## 2018-06-14 DIAGNOSIS — Z7982 Long term (current) use of aspirin: Secondary | ICD-10-CM

## 2018-06-14 DIAGNOSIS — Z79899 Other long term (current) drug therapy: Secondary | ICD-10-CM | POA: Insufficient documentation

## 2018-06-14 HISTORY — PX: POLYPECTOMY: SHX5525

## 2018-06-14 HISTORY — PX: COLONOSCOPY WITH PROPOFOL: SHX5780

## 2018-06-14 LAB — GLUCOSE, CAPILLARY
GLUCOSE-CAPILLARY: 88 mg/dL (ref 70–99)
Glucose-Capillary: 91 mg/dL (ref 70–99)

## 2018-06-14 SURGERY — COLONOSCOPY WITH PROPOFOL
Anesthesia: Monitor Anesthesia Care

## 2018-06-14 MED ORDER — DIPHENHYDRAMINE HCL 25 MG PO TABS: 25.0000 mg | ORAL_TABLET | Freq: Three times a day (TID) | ORAL | 0 refills | Status: DC

## 2018-06-14 MED ORDER — FAMOTIDINE IN NACL 20-0.9 MG/50ML-% IV SOLN
20.0000 mg | Freq: Once | INTRAVENOUS | Status: AC
  Administered 2018-06-14: 20 mg via INTRAVENOUS
  Filled 2018-06-14: qty 50

## 2018-06-14 MED ORDER — PROPOFOL 10 MG/ML IV BOLUS
INTRAVENOUS | Status: DC | PRN
  Administered 2018-06-14 (×4): 20 mg via INTRAVENOUS

## 2018-06-14 MED ORDER — PROPOFOL 10 MG/ML IV BOLUS
INTRAVENOUS | Status: AC
  Filled 2018-06-14: qty 40

## 2018-06-14 MED ORDER — KETAMINE HCL 10 MG/ML IJ SOLN
INTRAMUSCULAR | Status: DC | PRN
  Administered 2018-06-14 (×2): 10 mg via INTRAVENOUS

## 2018-06-14 MED ORDER — PROPOFOL 500 MG/50ML IV EMUL
INTRAVENOUS | Status: DC | PRN
  Administered 2018-06-14: 09:00:00 via INTRAVENOUS
  Administered 2018-06-14: 150 ug/kg/min via INTRAVENOUS

## 2018-06-14 MED ORDER — LACTATED RINGERS IV SOLN
INTRAVENOUS | Status: DC
  Administered 2018-06-14: 09:00:00 via INTRAVENOUS

## 2018-06-14 MED ORDER — METHYLPREDNISOLONE SODIUM SUCC 125 MG IJ SOLR
125.0000 mg | Freq: Once | INTRAMUSCULAR | Status: AC
  Administered 2018-06-14: 125 mg via INTRAVENOUS
  Filled 2018-06-14: qty 2

## 2018-06-14 MED ORDER — DIPHENHYDRAMINE HCL 50 MG/ML IJ SOLN
25.0000 mg | Freq: Once | INTRAMUSCULAR | Status: AC
  Administered 2018-06-14: 25 mg via INTRAVENOUS
  Filled 2018-06-14: qty 1

## 2018-06-14 MED ORDER — FAMOTIDINE 20 MG PO TABS: 20.0000 mg | ORAL_TABLET | Freq: Two times a day (BID) | ORAL | 0 refills | Status: DC

## 2018-06-14 NOTE — Telephone Encounter (Signed)
Pt called this afternoon saying she had a procedure by [facility] earlier and since she got home her tongue has started swelling. I told her that she could be having a reaction and needed to go to the ER. Patient said she would go to the ER or either her PCP. I stressed again that she should go to the ER ASAP. Pt agreed.

## 2018-06-14 NOTE — Telephone Encounter (Addendum)
REVIEWED. PT RECEIVED KETAMINE 20 MG AND PROPOFOL 500 MG TOTAL FOR ANESTHESIA. ANGIOEDEMA MOST LIKELY DUE TO LISINOPRIL.

## 2018-06-14 NOTE — ED Triage Notes (Signed)
Pt went for a colonoscopy this morning, got home around 11 am and then began experiencing tongue swelling. Pt can speak and does not appear to have trouble breathing.

## 2018-06-14 NOTE — Anesthesia Postprocedure Evaluation (Signed)
Anesthesia Post Note  Patient: [name]  Procedure(s) Performed: COLONOSCOPY WITH PROPOFOL (N/A ) POLYPECTOMY  Patient location during evaluation: PACU Anesthesia Type: MAC Level of consciousness: awake and alert and oriented Pain management: pain level controlled Vital Signs Assessment: post-procedure vital signs reviewed and stable Respiratory status: spontaneous breathing Cardiovascular status: blood pressure returned to baseline and stable Postop Assessment: no apparent nausea or vomiting Anesthetic complications: no     Last Vitals:  Vitals:   06/14/18 0806  BP: 121/68  Pulse: 95  Temp: 36.7 C  SpO2: 98%    Last Pain:  Vitals:   06/14/18 0806  TempSrc: Oral                 Laddie Naeem

## 2018-06-14 NOTE — Discharge Instructions (Signed)
You have small internal HEMORRHOIDS and diverticulosis IN YOUR LEFT COLON. YOU HAD FIVE POLYPS REMOVED.   I tried to call your mother but the number was busy.  DRINK WATER TO KEEP YOUR URINE LIGHT YELLOW.  FOLLOW A HIGH FIBER DIET. AVOID ITEMS THAT CAUSE BLOATING. See info below.  CONTINUE YOUR WEIGHT LOSS EFFORTS.  WHILE I DO NOT WANT TO ALARM YOU, YOUR BODY MASS INDEX (BMI) IS OVER 40 WHICH MEANS YOU ARE MORBIDLY OBESE. OBESITY ACTIVATES CANCER GENES. MORBID OBESITY SHORTENS YOUR LIFE EXPECTANCY 10 YEARS. OBESITY IS ASSOCIATED WITH AN INCREASE RISK FOR ALL CANCERS, INCLUDING ESOPHAGEAL AND COLON CANCER. A WEIGHT OF   WILL GET YOUR BMI UNDER 30.   YOUR BIOPSY RESULTS WILL BE BACK IN 5 BUSINESS DAYS..   FOLLOW UP COLONOSCOPY WILL BE SCHEDULED AFTER PATH REPORT IS AVAILABLE.  Colonoscopy Care After Read the instructions outlined below and refer to this sheet in the next week. These discharge instructions provide you with general information on caring for yourself after you leave the hospital. While your treatment has been planned according to the most current medical practices available, unavoidable complications occasionally occur. If you have any problems or questions after discharge, call DR. Lesleyann Fichter, [phone].  ACTIVITY  You may resume your regular activity, but move at a slower pace for the next 24 hours.   Take frequent rest periods for the next 24 hours.   Walking will help get rid of the air and reduce the bloated feeling in your belly (abdomen).   No driving for 24 hours (because of the medicine (anesthesia) used during the test).   You may shower.   Do not sign any important legal documents or operate any machinery for 24 hours (because of the anesthesia used during the test).    NUTRITION  Drink plenty of fluids.   You may resume your normal diet as instructed by your doctor.   Begin with a light meal and progress to your normal diet. Heavy or fried foods are  harder to digest and may make you feel sick to your stomach (nauseated).   Avoid alcoholic beverages for 24 hours or as instructed.    MEDICATIONS  You may resume your normal medications.   WHAT YOU CAN EXPECT TODAY  Some feelings of bloating in the abdomen.   Passage of more gas than usual.   Spotting of blood in your stool or on the toilet paper  .  IF YOU HAD POLYPS REMOVED DURING THE COLONOSCOPY:  Eat a soft diet IF YOU HAVE NAUSEA, BLOATING, ABDOMINAL PAIN, OR VOMITING.    FINDING OUT THE RESULTS OF YOUR TEST Not all test results are available during your visit. DR. Oneida Alar WILL CALL YOU WITHIN 14 DAYS OF YOUR PROCEDUE WITH YOUR RESULTS. Do not assume everything is normal if you have not heard from DR. Christerpher Clos, CALL HER OFFICE AT [phone].  SEEK IMMEDIATE MEDICAL ATTENTION AND CALL THE OFFICE: [phone] IF:  You have more than a spotting of blood in your stool.   Your belly is swollen (abdominal distention).   You are nauseated or vomiting.   You have a temperature over 101F.   You have abdominal pain or discomfort that is severe or gets worse throughout the day.  High-Fiber Diet A high-fiber diet changes your normal diet to include more whole grains, legumes, fruits, and vegetables. Changes in the diet involve replacing refined carbohydrates with unrefined foods. The calorie level of the diet is essentially unchanged. The Dietary Reference Intake (  recommended amount) for adult males is 38 grams per day. For adult females, it is 25 grams per day. Pregnant and lactating women should consume 28 grams of fiber per day. Fiber is the intact part of a plant that is not broken down during digestion. Functional fiber is fiber that has been isolated from the plant to provide a beneficial effect in the body. PURPOSE  Increase stool bulk.   Ease and regulate bowel movements.   Lower cholesterol.   REDUCE RISK OF COLON CANCER  INDICATIONS THAT YOU NEED MORE  FIBER  Constipation and hemorrhoids.   Uncomplicated diverticulosis (intestine condition) and irritable bowel syndrome.   Weight management.   As a protective measure against hardening of the arteries (atherosclerosis), diabetes, and cancer.   GUIDELINES FOR INCREASING FIBER IN THE DIET  Start adding fiber to the diet slowly. A gradual increase of about 5 more grams (2 slices of whole-wheat bread, 2 servings of most fruits or vegetables, or 1 bowl of high-fiber cereal) per day is best. Too rapid an increase in fiber may result in constipation, flatulence, and bloating.   Drink enough water and fluids to keep your urine clear or pale yellow. Water, juice, or caffeine-free drinks are recommended. Not drinking enough fluid may cause constipation.   Eat a variety of high-fiber foods rather than one type of fiber.   Try to increase your intake of fiber through using high-fiber foods rather than fiber pills or supplements that contain small amounts of fiber.   The goal is to change the types of food eaten. Do not supplement your present diet with high-fiber foods, but replace foods in your present diet.   INCLUDE A VARIETY OF FIBER SOURCES  Replace refined and processed grains with whole grains, canned fruits with fresh fruits, and incorporate other fiber sources. White rice, white breads, and most bakery goods contain little or no fiber.   Brown whole-grain rice, buckwheat oats, and many fruits and vegetables are all good sources of fiber. These include: broccoli, Brussels sprouts, cabbage, cauliflower, beets, sweet potatoes, white potatoes (skin on), carrots, tomatoes, eggplant, squash, berries, fresh fruits, and dried fruits.   Cereals appear to be the richest source of fiber. Cereal fiber is found in whole grains and bran. Bran is the fiber-rich outer coat of cereal grain, which is largely removed in refining. In whole-grain cereals, the bran remains. In breakfast cereals, the largest  amount of fiber is found in those with "bran" in their names. The fiber content is sometimes indicated on the label.   You may need to include additional fruits and vegetables each day.   In baking, for 1 cup white flour, you may use the following substitutions:   1 cup whole-wheat flour minus 2 tablespoons.   1/2 cup white flour plus 1/2 cup whole-wheat flour.   Polyps, Colon  A polyp is extra tissue that grows inside your body. Colon polyps grow in the large intestine. The large intestine, also called the colon, is part of your digestive system. It is a long, hollow tube at the end of your digestive tract where your body makes and stores stool.Most polyps are not dangerous. They are benign. This means they are not cancerous. But over time, some types of polyps can turn into cancer. Polyps that are smaller than a pea are usually not harmful. But larger polyps could someday become or may already be cancerous. To be safe, doctors remove all polyps and test them.   PREVENTION There is not  one sure way to prevent polyps. You might be able to lower your risk of getting them if you:  Eat more fruits and vegetables and less fatty food.   Do not smoke.   Avoid alcohol.   Exercise every day.   Lose weight if you are overweight.   Eating more calcium and folate can also lower your risk of getting polyps. Some foods that are rich in calcium are milk, cheese, and broccoli. Some foods that are rich in folate are chickpeas, kidney beans, and spinach.    Diverticulosis Diverticulosis is a common condition that develops when small pouches (diverticula) form in the wall of the colon. The risk of diverticulosis increases with age. It happens more often in people who eat a low-fiber diet. Most individuals with diverticulosis have no symptoms. Those individuals with symptoms usually experience belly (abdominal) pain, constipation, or loose stools (diarrhea).  HOME CARE INSTRUCTIONS  Increase the  amount of fiber in your diet as directed by your caregiver or dietician. This may reduce symptoms of diverticulosis.   Drink at least 6 to 8 glasses of water each day to prevent constipation.   Try not to strain when you have a bowel movement.   Avoiding nuts and seeds to prevent complications is NOT NECESSARY.   FOODS HAVING HIGH FIBER CONTENT INCLUDE:  Fruits. Apple, peach, pear, tangerine, raisins, prunes.   Vegetables. Brussels sprouts, asparagus, broccoli, cabbage, carrot, cauliflower, romaine lettuce, spinach, summer squash, tomato, winter squash, zucchini.   Starchy Vegetables. Baked beans, kidney beans, lima beans, split peas, lentils, potatoes (with skin).   Grains. Whole wheat bread, brown rice, bran flake cereal, plain oatmeal, white rice, shredded wheat, bran muffins.    SEEK IMMEDIATE MEDICAL CARE IF:  You develop increasing pain or severe bloating.   You have an oral temperature above 101F.   You develop vomiting or bowel movements that are bloody or black.

## 2018-06-14 NOTE — ED Provider Notes (Signed)
[facility] EMERGENCY DEPARTMENT Provider Note   CSN: [id number] Arrival date & time: 06/14/18  1450     History   Chief Complaint Chief Complaint  Patient presents with  . Allergic Reaction    HPI [name] is a 57 y.o. female.  HPI Presents with concern of tongue swelling. Patient was in her usual state of health, recovering from a planned colonoscopy, when she woke, noticed a small lesion on the left side of her tongue, and subsequently felt enlargement of the tongue itself. No difficulty swallowing or speaking, nor any dyspnea. However, with sense of increasing tongue swelling she presents for evaluation.  Past Medical History:  Diagnosis Date  . Arthritis   . Complication of anesthesia   . Diabetes mellitus ([place])   . DOE (dyspnea on exertion)   . Eczema   . Glaucoma   . Hypercholesterolemia   . Hypertension   . Infection    r knee  . Muscle spasm of back   . Muscle spasms of both lower extremities   . PONV (postoperative nausea and vomiting)   . Positive ANA (antinuclear antibody)    1:180 homogenous pattern    Patient Active Problem List   Diagnosis Date Noted  . GERD (gastroesophageal reflux disease) 01/21/2018  . Encounter for screening colonoscopy 01/21/2018  . Trichomonas vaginitis 06/25/2016  . 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 Rash and nonspecific skin eruption 03/03/2016  . Positive ANA (antinuclear antibody) 02/27/2016  . Eczema 02/27/2016  . Dysmenorrhea 02/19/2016  . Tobacco use  02/06/2016  . DM2 (diabetes mellitus, type 2) ([place]) 02/06/2016  . Infected orthopedic implant ([place]) 11/23/2013  . History of knee replacement procedure of right knee 10/10/2013  . Rupture patellar tendon 10/10/2013  . Postoperative infection of knee ([place]) 10/10/2013  . Arthritis of knee, right 03/30/2013  . Shoulder mass 08/13/2011    Past Surgical History:  Procedure Laterality Date  . IRRIGATION AND DEBRIDEMENT KNEE Right 05/12/2013   Procedure:  IRRIGATION AND DEBRIDEMENT KNEE;  Surgeon: Carole Civil, MD;  Location: [facility];  Service: Orthopedics;  Laterality: Right;  . JOINT REPLACEMENT     r knee  . LIPOMA EXCISION  11/16/2011   Procedure: EXCISION LIPOMA;  Surgeon: Scherry Ran, MD;  Location: [facility];  Service: General;  Laterality: Left;  Excision of neoplasm left shoulder  . MOUTH SURGERY    . MULTIPLE EXTRACTIONS WITH ALVEOLOPLASTY  12/28/2011   Procedure: MULTIPLE EXTRACION WITH ALVEOLOPLASTY;  Surgeon: Gae Bon, DDS;  Location: [place];  Service: Oral Surgery;  Laterality: Bilateral;  . PATELLAR TENDON REPAIR Right 05/12/2013   Procedure: PATELLA TENDON REPAIR AND ALLOGRAFT RECONSTRUCTION;  Surgeon: Carole Civil, MD;  Location: [facility];  Service: Orthopedics;  Laterality: Right;  . resection arthroplasty right total knee  01/28/16   Dr Starlyn Skeans, [facility]; antibiotic spacer placed  . SHOULDER SURGERY Left   . TOTAL KNEE ARTHROPLASTY Right 04/17/2013   Procedure: RIGHT TOTAL KNEE ARTHROPLASTY;  Surgeon: Carole Civil, MD;  Location: [facility];  Service: Orthopedics;  Laterality: Right;  . TOTAL KNEE ARTHROPLASTY Right 05/12/2013   Procedure: POLY EXCHANGE;  Surgeon: Carole Civil, MD;  Location: [facility];  Service: Orthopedics;  Laterality: Right;  . TUBAL LIGATION     and burned per patient.      OB History    Gravida  5   Para  5   Term  5   Preterm      AB  Living  6     SAB      TAB      Ectopic      Multiple  1   Live Births               Home Medications    Prior to Admission medications   Medication Sig Start Date End Date Taking? Authorizing Provider  aspirin 81 MG tablet Take 81 mg by mouth daily.     [provider]  atorvastatin (LIPITOR) 20 MG tablet Take 20 mg by mouth daily.    [provider]  bisacodyl (WOMENS LAXATIVE) 5 MG EC tablet Take 5 mg daily as needed by mouth for moderate constipation.    [provider]  cetirizine  (ZYRTEC) 10 MG tablet Take 10 mg by mouth daily.    [provider]  cyclobenzaprine (FLEXERIL) 10 MG tablet Take 10 mg by mouth as needed for muscle spasms.     [provider]  diphenhydrAMINE (BENADRYL) 25 mg capsule Take 25 mg every 8 (eight) hours as needed by mouth (for itching/skin crawling).     [provider]  IBU 800 MG tablet Take 800 mg by mouth 3 (three) times daily.  05/28/17   [provider]  linagliptin (TRADJENTA) 5 MG TABS tablet Take 5 mg by mouth daily.    [provider]  lisinopril-hydrochlorothiazide (PRINZIDE,ZESTORETIC) 20-12.5 MG tablet Take 1 tablet by mouth daily.    [provider]  metFORMIN (GLUCOPHAGE) 500 MG tablet Take 1,000 mg by mouth 2 (two) times daily.     [provider]  pantoprazole (PROTONIX) 40 MG tablet Take 1 tablet (40 mg total) by mouth daily. Take 30 minutes before breakfast daily 01/21/18   Annitta Needs, NP  travoprost, benzalkonium, (TRAVATAN) 0.004 % ophthalmic solution Place 1 drop into both eyes at bedtime.     [provider]  zolpidem (AMBIEN) 10 MG tablet Take 1 tablet (10 mg total) by mouth at bedtime as needed for sleep. Patient taking differently: Take 10 mg at bedtime by mouth.  05/25/16   Gayland Curry, DO    Family History Family History  Problem Relation Age of Onset  . Diabetes Brother   . Diabetes Mother   . Multiple sclerosis Sister   . Arthritis Neg Hx   . Anesthesia problems Neg Hx   . Hypotension Neg Hx   . Malignant hyperthermia Neg Hx   . Pseudochol deficiency Neg Hx   . Cancer Neg Hx   . Heart disease Neg Hx   . Stroke Neg Hx   . Colon cancer Neg Hx   . Colon polyps Neg Hx     Social History Social History   Tobacco Use  . Smoking status: Current Some Day Smoker    Packs/day: 0.10    Years: 1.00    Pack years: 0.10    Types: Cigarettes    Last attempt to quit: 01/28/2016    Years since quitting: 2.3  . Smokeless tobacco: Never Used   Substance Use Topics  . Alcohol use: Yes    Alcohol/week: 0.0 standard drinks    Comment: "once every blue moon"   . Drug use: No     Allergies   Amoxicillin; Chocolate; and Nystatin   Review of Systems Review of Systems  Constitutional:       Per HPI, otherwise negative  HENT:       Per HPI, otherwise negative  Respiratory:  Per HPI, otherwise negative  Cardiovascular:       Per HPI, otherwise negative  Gastrointestinal: Negative for vomiting.  Endocrine:       Negative aside from HPI  Genitourinary:       Neg aside from HPI   Musculoskeletal:       Per HPI, otherwise negative  Skin: Negative.   Neurological: Negative for syncope.     Physical Exam Updated Vital Signs BP 129/64   Pulse 100   Temp 98.1 F (36.7 C) (Oral)   Resp 16   Ht 5\' 2"  (1.575 m)   Wt 109.8 kg   LMP 01/15/2012   SpO2 100%   BMI 44.26 kg/m   Physical Exam  Constitutional: She is oriented to person, place, and time. She appears well-developed and well-nourished. No distress.  HENT:  Head: Normocephalic and atraumatic.  Mouth/Throat: Uvula is midline.    Eyes: Conjunctivae and EOM are normal.  Cardiovascular: Normal rate and regular rhythm.  Pulmonary/Chest: Effort normal and breath sounds normal. No stridor. No respiratory distress.  Abdominal: She exhibits no distension.  Musculoskeletal: She exhibits no edema.  Neurological: She is alert and oriented to person, place, and time. No cranial nerve deficit.  Skin: Skin is warm and dry.  Psychiatric: She has a normal mood and affect.  Nursing note and vitals reviewed.    ED Treatments / Results   Procedures Procedures (including critical care time)  Medications Ordered in ED Medications  famotidine (PEPCID) IVPB 20 mg premix (20 mg Intravenous New Bag/Given 06/14/18 1534)  diphenhydrAMINE (BENADRYL) injection 25 mg (25 mg Intravenous Given 06/14/18 1526)  methylPREDNISolone sodium succinate (SOLU-MEDROL) 125 mg/2 mL  injection 125 mg (125 mg Intravenous Given 06/14/18 1529)     Initial Impression / Assessment and Plan / ED Course  I have reviewed the triage vital signs and the nursing notes.  Pertinent labs & imaging results that were available during my care of the patient were reviewed by me and considered in my medical decision making (see chart for details).    After the initial evaluation the patient received Pepcid, Benadryl, steroids, was placed on continuous cardiac monitoring, oxygen saturation device. 4:01 PM Patient in no distress, tongue has diminished in size, patient has received all medication, states that she feels better. With no evidence for airway compromise, improvement in her condition, patient is appropriate for discharge with home monitoring, this was conveyed to her and her family member, both of whom understand the need for ongoing medication, and return precautions.   Final Clinical Impressions(s) / ED Diagnoses  Allergic reaction, initial encounter   Carmin Muskrat, MD 06/14/18 [phone]

## 2018-06-14 NOTE — Transfer of Care (Signed)
Immediate Anesthesia Transfer of Care Note  Patient: [name]  Procedure(s) Performed: COLONOSCOPY WITH PROPOFOL (N/A ) POLYPECTOMY  Patient Location: PACU  Anesthesia Type:MAC  Level of Consciousness: awake  Airway & Oxygen Therapy: Patient Spontanous Breathing  Post-op Assessment: Report given to RN  Post vital signs: Reviewed  Last Vitals:  Vitals Value Taken Time  BP    Temp    Pulse    Resp 23 06/14/2018  9:50 AM  SpO2    Vitals shown include unvalidated device data.  Last Pain:  Vitals:   06/14/18 0806  TempSrc: Oral         Complications: No apparent anesthesia complications

## 2018-06-14 NOTE — Telephone Encounter (Signed)
Tried calling pt, phone wrong without a VM. Not able to leave a message. Pt may be at the ED.

## 2018-06-14 NOTE — H&P (Signed)
Primary Care Physician:  Rosita Fire, MD Primary Gastroenterologist:  Dr. Oneida Alar  Pre-Procedure History & Physical: HPI:  [name] is a 57 y.o. female here for COLON CANCER SCREENING.  Past Medical History:  Diagnosis Date  . Arthritis   . Complication of anesthesia   . Diabetes mellitus ([place])   . DOE (dyspnea on exertion)   . Eczema   . Glaucoma   . Hypercholesterolemia   . Hypertension   . Infection    r knee  . Muscle spasm of back   . Muscle spasms of both lower extremities   . PONV (postoperative nausea and vomiting)   . Positive ANA (antinuclear antibody)    1:180 homogenous pattern    Past Surgical History:  Procedure Laterality Date  . IRRIGATION AND DEBRIDEMENT KNEE Right 05/12/2013   Procedure: IRRIGATION AND DEBRIDEMENT KNEE;  Surgeon: Carole Civil, MD;  Location: [facility];  Service: Orthopedics;  Laterality: Right;  . JOINT REPLACEMENT     r knee  . LIPOMA EXCISION  11/16/2011   Procedure: EXCISION LIPOMA;  Surgeon: Scherry Ran, MD;  Location: [facility];  Service: General;  Laterality: Left;  Excision of neoplasm left shoulder  . MOUTH SURGERY    . MULTIPLE EXTRACTIONS WITH ALVEOLOPLASTY  12/28/2011   Procedure: MULTIPLE EXTRACION WITH ALVEOLOPLASTY;  Surgeon: Gae Bon, DDS;  Location: [facility];  Service: Oral Surgery;  Laterality: Bilateral;  . PATELLAR TENDON REPAIR Right 05/12/2013   Procedure: PATELLA TENDON REPAIR AND ALLOGRAFT RECONSTRUCTION;  Surgeon: Carole Civil, MD;  Location: [facility];  Service: Orthopedics;  Laterality: Right;  . resection arthroplasty right total knee  01/28/16   Dr Starlyn Skeans, [facility]; antibiotic spacer placed  . SHOULDER SURGERY Left   . TOTAL KNEE ARTHROPLASTY Right 04/17/2013   Procedure: RIGHT TOTAL KNEE ARTHROPLASTY;  Surgeon: Carole Civil, MD;  Location: [facility];  Service: Orthopedics;  Laterality: Right;  . TOTAL KNEE ARTHROPLASTY Right 05/12/2013   Procedure: POLY EXCHANGE;  Surgeon: Carole Civil, MD;  Location: [facility];  Service: Orthopedics;  Laterality: Right;  . TUBAL LIGATION     and burned per patient.     Prior to Admission medications   Medication Sig Start Date End Date Taking? Authorizing Provider  aspirin 81 MG tablet Take 81 mg by mouth daily.    Yes [provider]  atorvastatin (LIPITOR) 20 MG tablet Take 20 mg by mouth daily.   Yes [provider]  bisacodyl (WOMENS LAXATIVE) 5 MG EC tablet Take 5 mg daily as needed by mouth for moderate constipation.   Yes [provider]  cetirizine (ZYRTEC) 10 MG tablet Take 10 mg by mouth daily.   Yes [provider]  cyclobenzaprine (FLEXERIL) 10 MG tablet Take 10 mg by mouth as needed for muscle spasms.    Yes [provider]  diphenhydrAMINE (BENADRYL) 25 mg capsule Take 25 mg every 8 (eight) hours as needed by mouth (for itching/skin crawling).    Yes [provider]  IBU 800 MG tablet Take 800 mg by mouth 3 (three) times daily.  05/28/17  Yes [provider]  linagliptin (TRADJENTA) 5 MG TABS tablet Take 5 mg by mouth daily.   Yes [provider]  lisinopril-hydrochlorothiazide (PRINZIDE,ZESTORETIC) 20-12.5 MG tablet Take 1 tablet by mouth daily.   Yes [provider]  metFORMIN (GLUCOPHAGE) 500 MG tablet Take 1,000 mg by mouth 2 (two) times daily.    Yes [provider]  pantoprazole (  PROTONIX) 40 MG tablet Take 1 tablet (40 mg total) by mouth daily. Take 30 minutes before breakfast daily 01/21/18  Yes Annitta Needs, NP  travoprost, benzalkonium, (TRAVATAN) 0.004 % ophthalmic solution Place 1 drop into both eyes at bedtime.    Yes [provider]  zolpidem (AMBIEN) 10 MG tablet Take 1 tablet (10 mg total) by mouth at bedtime as needed for sleep. Patient taking differently: Take 10 mg at bedtime by mouth.  05/25/16  Yes Reed, Tiffany L, DO    Allergies as of 01/21/2018 - Review Complete 01/21/2018  Allergen Reaction Noted  .  Chocolate Hives 11/13/2011  . Nystatin Anxiety 02/06/2016    Family History  Problem Relation Age of Onset  . Diabetes Brother   . Diabetes Mother   . Multiple sclerosis Sister   . Arthritis Neg Hx   . Anesthesia problems Neg Hx   . Hypotension Neg Hx   . Malignant hyperthermia Neg Hx   . Pseudochol deficiency Neg Hx   . Cancer Neg Hx   . Heart disease Neg Hx   . Stroke Neg Hx   . Colon cancer Neg Hx   . Colon polyps Neg Hx     Social History   Socioeconomic History  . Marital status: Single    Spouse name: Not on file  . Number of children: Not on file  . Years of education: 84  . Highest education level: Not on file  Occupational History    Employer: NOT EMPLOYED  Social Needs  . Financial resource strain: Not on file  . Food insecurity:    Worry: Not on file    Inability: Not on file  . Transportation needs:    Medical: Not on file    Non-medical: Not on file  Tobacco Use  . Smoking status: Current Some Day Smoker    Packs/day: 0.10    Years: 1.00    Pack years: 0.10    Types: Cigarettes    Last attempt to quit: 01/28/2016    Years since quitting: 2.3  . Smokeless tobacco: Never Used  Substance and Sexual Activity  . Alcohol use: Yes    Alcohol/week: 0.0 standard drinks    Comment: "once every blue moon"   . Drug use: No  . Sexual activity: Not Currently    Birth control/protection: None  Lifestyle  . Physical activity:    Days per week: Not on file    Minutes per session: Not on file  . Stress: Not on file  Relationships  . Social connections:    Talks on phone: Not on file    Gets together: Not on file    Attends religious service: Not on file    Active member of club or organization: Not on file    Attends meetings of clubs or organizations: Not on file    Relationship status: Not on file  . Intimate partner violence:    Fear of current or ex partner: Not on file    Emotionally abused: Not on file    Physically abused: Not on file    Forced  sexual activity: Not on file  Other Topics Concern  . Not on file  Social History Narrative  . Not on file    Review of Systems: See HPI, otherwise negative ROS   Physical Exam: BP 121/68   Pulse 95   Temp 98 F (36.7 C) (Oral)   LMP 01/15/2012   SpO2 98%  General:   Alert,  pleasant and cooperative in NAD Head:  Normocephalic and atraumatic. Neck:  Supple; Lungs:  Clear throughout to auscultation.    Heart:  Regular rate and rhythm. Abdomen:  Soft, nontender and nondistended. Normal bowel sounds, without guarding, and without rebound.   Neurologic:  Alert and  oriented x4;  grossly normal neurologically.  Impression/Plan:    SCREENING  Plan:  1. TCS TODAY DISCUSSED PROCEDURE, BENEFITS, & RISKS: < 1% chance of medication reaction, bleeding, perforation, or rupture of spleen/liver.

## 2018-06-14 NOTE — Op Note (Signed)
[facility] Patient Name: [name] Procedure Date: 06/14/2018 8:58 AM MRN: [id number] Date of Birth: Oct 10, 1960 Attending MD: Barney Drain MD, MD CSN: [id number] Age: 57 Admit Type: Outpatient Procedure:                Colonoscopy WITH COLD SNARE POLYPECTOMY Indications:              Screening for colorectal malignant neoplasm Providers:                Barney Drain MD, MD, Janeece Riggers, RN, Nelma Rothman,                            Technician Referring MD:             Rosita Fire MD, MD Medicines:                Propofol per Anesthesia Complications:            No immediate complications. Estimated Blood Loss:     Estimated blood loss was minimal. Procedure:                Pre-Anesthesia Assessment:                           - Prior to the procedure, a History and Physical                            was performed, and patient medications and                            allergies were reviewed. The patient's tolerance of                            previous anesthesia was also reviewed. The risks                            and benefits of the procedure and the sedation                            options and risks were discussed with the patient.                            All questions were answered, and informed consent                            was obtained. Prior Anticoagulants: The patient has                            taken aspirin, last dose was day of procedure. ASA                            Grade Assessment: II - A patient with mild systemic                            disease. After reviewing the risks and benefits,  the patient was deemed in satisfactory condition to                            undergo the procedure. After obtaining informed                            consent, the colonoscope was passed under direct                            vision. Throughout the procedure, the patient's                            blood pressure, pulse,  and oxygen saturations were                            monitored continuously. The CF-[id number] ([id number])                            scope was introduced through the anus and advanced                            to the the cecum, identified by appendiceal orifice                            and ileocecal valve. The colonoscopy was                            technically difficult and complex due to                            significant looping. Successful completion of the                            procedure was aided by straightening and shortening                            the scope to obtain bowel loop reduction and                            COLOWRAP. The ileocecal valve, appendiceal orifice,                            and rectum were photographed. The patient tolerated                            the procedure fairly well. The quality of the bowel                            preparation was good except the CECUM/ascending                            colon was fair. Scope In: 9:14:18 AM Scope Out: 9:42:04 AM Scope Withdrawal Time: 0 hours 19 minutes 40 seconds  Total Procedure Duration:  0 hours 27 minutes 46 seconds  Findings:      Five sessile polyps were found in the rectum and sigmoid colon. The       polyps were 4 to 6 mm in size. These polyps were removed with a cold       snare. Resection and retrieval were complete.      A few small and large-mouthed diverticula were found in the       recto-sigmoid colon, sigmoid colon and descending colon.      Internal hemorrhoids were found. The hemorrhoids were small. Impression:               - Five 4 to 6 mm polyps in the rectum and in the                            sigmoid colon, removed with a cold snare. Resected                            and retrieved.                           - Diverticulosis in the recto-sigmoid colon, in the                            sigmoid colon and in the descending colon.                           - Internal  hemorrhoids. Moderate Sedation:      Per Anesthesia Care Recommendation:           - Patient has a contact number available for                            emergencies. The signs and symptoms of potential                            delayed complications were discussed with the                            patient. Return to normal activities tomorrow.                            Written discharge instructions were provided to the                            patient.                           - High fiber diet.                           - Continue present medications.                           - Await pathology results.                           - Repeat colonoscopy date  to be determined after                            pending pathology results are reviewed for                            surveillance.MAY BE AS SOON AS 3 MOS OR AS LATE AS                            5 YRS. Procedure Code(s):        --- Professional ---                           [phone], Colonoscopy, flexible; with removal of                            tumor(s), polyp(s), or other lesion(s) by snare                            technique Diagnosis Code(s):        --- Professional ---                           Z12.11, Encounter for screening for malignant                            neoplasm of colon                           K62.1, Rectal polyp                           D12.5, Benign neoplasm of sigmoid colon                           K64.8, Other hemorrhoids                           K57.30, Diverticulosis of large intestine without                            perforation or abscess without bleeding CPT copyright 2018 American Medical Association. All rights reserved. The codes documented in this report are preliminary and upon coder review may  be revised to meet current compliance requirements. Barney Drain, MD Barney Drain MD, MD 06/14/2018 10:03:01 AM This report has been signed electronically. Number of Addenda: 0

## 2018-06-14 NOTE — ED Notes (Signed)
Patient given discharge instruction, verbalized understand. IV removed, band aid applied. Patient ambulatory out of the department.  

## 2018-06-14 NOTE — ED Notes (Signed)
Pt has not complaints, ready to go home, tongue has decreased in size.

## 2018-06-14 NOTE — Anesthesia Preprocedure Evaluation (Signed)
Anesthesia Evaluation  Patient identified by MRN, date of birth, ID band Patient awake    Reviewed: Allergy & Precautions, H&P , NPO status , Patient's Chart, lab work & pertinent test results, reviewed documented beta blocker date and time   History of Anesthesia Complications (+) PONV and history of anesthetic complications  Airway Mallampati: II  TM Distance: >3 FB Neck ROM: full    Dental no notable dental hx.    Pulmonary neg pulmonary ROS, Current Smoker,    Pulmonary exam normal breath sounds clear to auscultation       Cardiovascular Exercise Tolerance: Good hypertension, + DOE   Rhythm:regular Rate:Normal     Neuro/Psych negative neurological ROS  negative psych ROS   GI/Hepatic Neg liver ROS, GERD  ,  Endo/Other  diabetesMorbid obesity  Renal/GU negative Renal ROS  negative genitourinary   Musculoskeletal   Abdominal   Peds  Hematology negative hematology ROS (+)   Anesthesia Other Findings   Reproductive/Obstetrics negative OB ROS                             Anesthesia Physical Anesthesia Plan  ASA: III  Anesthesia Plan: MAC   Post-op Pain Management:    Induction:   PONV Risk Score and Plan:   Airway Management Planned:   Additional Equipment:   Intra-op Plan:   Post-operative Plan:   Informed Consent: I have reviewed the patients History and Physical, chart, labs and discussed the procedure including the risks, benefits and alternatives for the proposed anesthesia with the patient or authorized representative who has indicated his/her understanding and acceptance.     Plan Discussed with: CRNA  Anesthesia Plan Comments:         Anesthesia Quick Evaluation

## 2018-06-14 NOTE — ED Notes (Signed)
EDP at the bedside, tongue swollen, pt talking denies, difficulty with throat or breathing

## 2018-06-14 NOTE — Discharge Instructions (Signed)
As discussed, your evaluation today has been largely reassuring.  But, it is important that you monitor your condition carefully, and do not hesitate to return to the ED if you develop new, or concerning changes in your condition. ? ?Otherwise, please follow-up with your physician for appropriate ongoing care. ? ?

## 2018-06-15 ENCOUNTER — Telehealth: Payer: Self-pay | Admitting: Gastroenterology

## 2018-06-15 NOTE — Telephone Encounter (Signed)
Please call pt. She had FIVE HYPERPLASTIC POLYPS removed.    DRINK WATER TO KEEP YOUR URINE LIGHT YELLOW. FOLLOW A HIGH FIBER DIET. AVOID ITEMS THAT CAUSE BLOATING.  CONTINUE YOUR WEIGHT LOSS EFFORTS.  WHILE I DO NOT WANT TO ALARM YOU, YOUR BODY MASS INDEX (BMI) IS OVER 40 WHICH MEANS YOU ARE MORBIDLY OBESE. OBESITY ACTIVATES CANCER GENES. MORBID OBESITY SHORTENS YOUR LIFE EXPECTANCY 10 YEARS. OBESITY IS ASSOCIATED WITH AN INCREASE RISK FOR ALL CANCERS, INCLUDING ESOPHAGEAL AND COLON CANCER. A WEIGHT OF   WILL GET YOUR BMI UNDER 30.  NEXT COLONOSCOPY IN 5 YEARS BECAUSE THE PREP IN YOUR RIGHT COLON WAS NOT IDEAL.

## 2018-06-15 NOTE — Telephone Encounter (Addendum)
PLEASE CALL PT. SHE SHOULD NOT TAKE LISINOPRIL ESPECIALLY IF THIS JUST STARTED AFTER TAKING THE MEDICATION.  SHE SHOULD SEE DR. FANTA ASAP FOR AN ALTERNATIVE. 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

## 2018-06-15 NOTE — Telephone Encounter (Signed)
REVIEWED-NO ADDITIONAL RECOMMENDATIONS. 

## 2018-06-15 NOTE — Telephone Encounter (Signed)
I spoke to pt and she said she WILL NOT take it anymore. She has appt on 06/23/2018 at 10:00 am with Dr. Legrand Rams.

## 2018-06-15 NOTE — Progress Notes (Signed)
CC'D TO PCP °

## 2018-06-15 NOTE — Progress Notes (Signed)
Pt is aware of results and plan. I scheduled her an appt with Dr. Legrand Rams for 06/23/2018 at 10:00 AM.  She is aware. Forwarding to Manuela Schwartz to send the EKG report.

## 2018-06-15 NOTE — Telephone Encounter (Signed)
I spoke to pt and she did go to ED, is feeling much better today. She has been taking the Lisinopril since 06/08/2018, and took it this morning without any difficulty.

## 2018-06-16 ENCOUNTER — Encounter: Payer: Self-pay | Admitting: Orthopaedic Surgery

## 2018-06-16 ENCOUNTER — Ambulatory Visit (INDEPENDENT_AMBULATORY_CARE_PROVIDER_SITE_OTHER): Payer: Medicare Other | Admitting: Orthopaedic Surgery

## 2018-06-16 VITALS — BP 129/85 | HR 108 | Ht 62.0 in | Wt 242.0 lb

## 2018-06-16 DIAGNOSIS — Z6841 Body Mass Index (BMI) 40.0 and over, adult: Secondary | ICD-10-CM

## 2018-06-16 DIAGNOSIS — G8929 Other chronic pain: Secondary | ICD-10-CM

## 2018-06-16 DIAGNOSIS — M25512 Pain in left shoulder: Secondary | ICD-10-CM

## 2018-06-16 NOTE — Telephone Encounter (Signed)
Reminder in epic and cc'ed to pcp

## 2018-06-16 NOTE — Telephone Encounter (Signed)
Reminder in epic, cc'ed to pcp °

## 2018-06-16 NOTE — Telephone Encounter (Signed)
PT is aware.

## 2018-06-16 NOTE — Progress Notes (Signed)
CC:  My shoulder is much better  She has full ROM of the left shoulder today and no pain.  NV intact.  She is doing well.  Encounter Diagnoses  Name Primary?  . Chronic left shoulder pain Yes  . Body mass index 40.0-44.9, adult ([place])   . Morbid obesity ([place])    I will see her as needed.  The patient meets the AMA guidelines for Morbid (severe) obesity with a BMI > 40.0 and I have recommended weight loss.  Call if any problem.  Precautions discussed.   Electronically Signed Sanjuana Kava, MD 10/17/20198:57 AM

## 2018-06-16 NOTE — Patient Instructions (Signed)
Steps to Quit Smoking Smoking tobacco can be bad for your health. It can also affect almost every organ in your body. Smoking puts you and people around you at risk for many serious long-lasting (chronic) diseases. Quitting smoking is hard, but it is one of the best things that you can do for your health. It is never too late to quit. What are the benefits of quitting smoking? When you quit smoking, you lower your risk for getting serious diseases and conditions. They can include:  Lung cancer or lung disease.  Heart disease.  Stroke.  Heart attack.  Not being able to have children (infertility).  Weak bones (osteoporosis) and broken bones (fractures).  If you have coughing, wheezing, and shortness of breath, those symptoms may get better when you quit. You may also get sick less often. If you are pregnant, quitting smoking can help to lower your chances of having a baby of low birth weight. What can I do to help me quit smoking? Talk with your doctor about what can help you quit smoking. Some things you can do (strategies) include:  Quitting smoking totally, instead of slowly cutting back how much you smoke over a period of time.  Going to in-person counseling. You are more likely to quit if you go to many counseling sessions.  Using resources and support systems, such as: ? Online chats with a counselor. ? Phone quitlines. ? Printed self-help materials. ? Support groups or group counseling. ? Text messaging programs. ? Mobile phone apps or applications.  Taking medicines. Some of these medicines may have nicotine in them. If you are pregnant or breastfeeding, do not take any medicines to quit smoking unless your doctor says it is okay. Talk with your doctor about counseling or other things that can help you.  Talk with your doctor about using more than one strategy at the same time, such as taking medicines while you are also going to in-person counseling. This can help make  quitting easier. What things can I do to make it easier to quit? Quitting smoking might feel very hard at first, but there is a lot that you can do to make it easier. Take these steps:  Talk to your family and friends. Ask them to support and encourage you.  Call phone quitlines, reach out to support groups, or work with a counselor.  Ask people who smoke to not smoke around you.  Avoid places that make you want (trigger) to smoke, such as: ? Bars. ? Parties. ? Smoke-break areas at work.  Spend time with people who do not smoke.  Lower the stress in your life. Stress can make you want to smoke. Try these things to help your stress: ? Getting regular exercise. ? Deep-breathing exercises. ? Yoga. ? Meditating. ? Doing a body scan. To do this, close your eyes, focus on one area of your body at a time from head to toe, and notice which parts of your body are tense. Try to relax the muscles in those areas.  Download or buy apps on your mobile phone or tablet that can help you stick to your quit plan. There are many free apps, such as QuitGuide from the CDC (Centers for Disease Control and Prevention). You can find more support from smokefree.gov and other websites.  This information is not intended to replace advice given to you by your health care provider. Make sure you discuss any questions you have with your health care provider. Document Released: 06/13/2009 Document   Revised: 04/14/2016 Document Reviewed: 01/01/2015 Elsevier Interactive Patient Education  2018 Elsevier Inc.  

## 2018-06-20 ENCOUNTER — Encounter (HOSPITAL_COMMUNITY): Payer: Self-pay | Admitting: Gastroenterology

## 2018-06-23 DIAGNOSIS — I10 Essential (primary) hypertension: Secondary | ICD-10-CM | POA: Diagnosis not present

## 2018-06-23 DIAGNOSIS — E1165 Type 2 diabetes mellitus with hyperglycemia: Secondary | ICD-10-CM | POA: Diagnosis not present

## 2018-06-23 DIAGNOSIS — T464X5D Adverse effect of angiotensin-converting-enzyme inhibitors, subsequent encounter: Secondary | ICD-10-CM | POA: Diagnosis not present

## 2018-07-12 DIAGNOSIS — H2513 Age-related nuclear cataract, bilateral: Secondary | ICD-10-CM | POA: Diagnosis not present

## 2018-07-12 DIAGNOSIS — E119 Type 2 diabetes mellitus without complications: Secondary | ICD-10-CM | POA: Diagnosis not present

## 2018-07-12 DIAGNOSIS — Z7984 Long term (current) use of oral hypoglycemic drugs: Secondary | ICD-10-CM | POA: Diagnosis not present

## 2018-07-12 DIAGNOSIS — H401131 Primary open-angle glaucoma, bilateral, mild stage: Secondary | ICD-10-CM | POA: Diagnosis not present

## 2018-08-08 DIAGNOSIS — M21961 Unspecified acquired deformity of right lower leg: Secondary | ICD-10-CM | POA: Diagnosis not present

## 2018-08-08 DIAGNOSIS — E1165 Type 2 diabetes mellitus with hyperglycemia: Secondary | ICD-10-CM | POA: Diagnosis not present

## 2018-08-08 DIAGNOSIS — I10 Essential (primary) hypertension: Secondary | ICD-10-CM | POA: Diagnosis not present

## 2018-10-12 DIAGNOSIS — L304 Erythema intertrigo: Secondary | ICD-10-CM | POA: Diagnosis not present

## 2018-10-19 ENCOUNTER — Other Ambulatory Visit: Payer: Self-pay

## 2018-10-19 NOTE — Patient Outreach (Signed)
[facility]) Care Management  10/19/2018  [name] 06/08/1961 [id number]   Medication Adherence call to [name] [facility] Identifier Verify spoke with patient she is due on Metformin 500 mg she explain she still has some left and will call the pharmacy  in about four more days patient did not want any assistance she said she knows how to take her medication and will order it when due .patient is showing past due under [place].   [facility] Management Direct Dial [phone]  Fax [phone] [email or web address]

## 2018-11-07 DIAGNOSIS — E1165 Type 2 diabetes mellitus with hyperglycemia: Secondary | ICD-10-CM | POA: Diagnosis not present

## 2018-11-07 DIAGNOSIS — I10 Essential (primary) hypertension: Secondary | ICD-10-CM | POA: Diagnosis not present

## 2018-11-07 DIAGNOSIS — M21961 Unspecified acquired deformity of right lower leg: Secondary | ICD-10-CM | POA: Diagnosis not present

## 2018-11-15 ENCOUNTER — Other Ambulatory Visit: Payer: Self-pay

## 2018-11-15 ENCOUNTER — Ambulatory Visit (INDEPENDENT_AMBULATORY_CARE_PROVIDER_SITE_OTHER): Payer: Medicare Other | Admitting: Orthopaedic Surgery

## 2018-11-15 ENCOUNTER — Encounter: Payer: Self-pay | Admitting: Orthopaedic Surgery

## 2018-11-15 DIAGNOSIS — Z6841 Body Mass Index (BMI) 40.0 and over, adult: Secondary | ICD-10-CM

## 2018-11-15 DIAGNOSIS — M25511 Pain in right shoulder: Secondary | ICD-10-CM | POA: Diagnosis not present

## 2018-11-15 DIAGNOSIS — G8929 Other chronic pain: Secondary | ICD-10-CM | POA: Diagnosis not present

## 2018-11-15 DIAGNOSIS — M25512 Pain in left shoulder: Secondary | ICD-10-CM

## 2018-11-15 DIAGNOSIS — F17210 Nicotine dependence, cigarettes, uncomplicated: Secondary | ICD-10-CM

## 2018-11-15 NOTE — Patient Instructions (Signed)
Steps to Quit Smoking    Smoking tobacco can be bad for your health. It can also affect almost every organ in your body. Smoking puts you and people around you at risk for many serious long-lasting (chronic) diseases. Quitting smoking is hard, but it is one of the best things that you can do for your health. It is never too late to quit.  What are the benefits of quitting smoking?  When you quit smoking, you lower your risk for getting serious diseases and conditions. They can include:  · Lung cancer or lung disease.  · Heart disease.  · Stroke.  · Heart attack.  · Not being able to have children (infertility).  · Weak bones (osteoporosis) and broken bones (fractures).  If you have coughing, wheezing, and shortness of breath, those symptoms may get better when you quit. You may also get sick less often. If you are pregnant, quitting smoking can help to lower your chances of having a baby of low birth weight.  What can I do to help me quit smoking?  Talk with your doctor about what can help you quit smoking. Some things you can do (strategies) include:  · Quitting smoking totally, instead of slowly cutting back how much you smoke over a period of time.  · Going to in-person counseling. You are more likely to quit if you go to many counseling sessions.  · Using resources and support systems, such as:  ? Online chats with a counselor.  ? Phone quitlines.  ? Printed self-help materials.  ? Support groups or group counseling.  ? Text messaging programs.  ? Mobile phone apps or applications.  · Taking medicines. Some of these medicines may have nicotine in them. If you are pregnant or breastfeeding, do not take any medicines to quit smoking unless your doctor says it is okay. Talk with your doctor about counseling or other things that can help you.  Talk with your doctor about using more than one strategy at the same time, such as taking medicines while you are also going to in-person counseling. This can help make  quitting easier.  What things can I do to make it easier to quit?  Quitting smoking might feel very hard at first, but there is a lot that you can do to make it easier. Take these steps:  · Talk to your family and friends. Ask them to support and encourage you.  · Call phone quitlines, reach out to support groups, or work with a counselor.  · Ask people who smoke to not smoke around you.  · Avoid places that make you want (trigger) to smoke, such as:  ? Bars.  ? Parties.  ? Smoke-break areas at work.  · Spend time with people who do not smoke.  · Lower the stress in your life. Stress can make you want to smoke. Try these things to help your stress:  ? Getting regular exercise.  ? Deep-breathing exercises.  ? Yoga.  ? Meditating.  ? Doing a body scan. To do this, close your eyes, focus on one area of your body at a time from head to toe, and notice which parts of your body are tense. Try to relax the muscles in those areas.  · Download or buy apps on your mobile phone or tablet that can help you stick to your quit plan. There are many free apps, such as QuitGuide from the CDC (Centers for Disease Control and Prevention). You can find more   support from smokefree.gov and other websites.  This information is not intended to replace advice given to you by your health care provider. Make sure you discuss any questions you have with your health care provider.  Document Released: 06/13/2009 Document Revised: 04/14/2016 Document Reviewed: 01/01/2015  Elsevier Interactive Patient Education © 2019 Elsevier Inc.

## 2018-11-15 NOTE — Progress Notes (Signed)
PROCEDURE NOTE:  The patient request injection, verbal consent was obtained.  The left shoulder was prepped appropriately after time out was performed.   Sterile technique was observed and injection of 1 cc of Depo-Medrol 40 mg with several cc's of plain xylocaine. Anesthesia was provided by ethyl chloride and a 20-gauge needle was used to inject the shoulder area. A posterior approach was used.  The injection was tolerated well.  A band aid dressing was applied.  The patient was advised to apply ice later today and tomorrow to the injection sight as needed.  PROCEDURE NOTE:  The patient request injection, verbal consent was obtained.  The right shoulder was prepped appropriately after time out was performed.   Sterile technique was observed and injection of 1 cc of Depo-Medrol 40 mg with several cc's of plain xylocaine. Anesthesia was provided by ethyl chloride and a 20-gauge needle was used to inject the shoulder area. A posterior approach was used.  The injection was tolerated well.  A band aid dressing was applied.  The patient was advised to apply ice later today and tomorrow to the injection sight as needed.   Encounter Diagnoses  Name Primary?  . Chronic left shoulder pain Yes  . Chronic right shoulder pain   . Body mass index 40.0-44.9, adult ([place])   . Morbid obesity ([place])   . Cigarette nicotine dependence without complication    Return as needed.  Call if any problem.  Precautions discussed.   Electronically Signed Sanjuana Kava, MD 3/17/20208:31 AM

## 2018-11-29 ENCOUNTER — Ambulatory Visit: Payer: Medicare Other | Admitting: Orthopaedic Surgery

## 2019-01-03 DIAGNOSIS — I10 Essential (primary) hypertension: Secondary | ICD-10-CM | POA: Diagnosis not present

## 2019-01-03 DIAGNOSIS — E1165 Type 2 diabetes mellitus with hyperglycemia: Secondary | ICD-10-CM | POA: Diagnosis not present

## 2019-01-03 DIAGNOSIS — N390 Urinary tract infection, site not specified: Secondary | ICD-10-CM | POA: Diagnosis not present

## 2019-02-06 ENCOUNTER — Other Ambulatory Visit (HOSPITAL_COMMUNITY): Payer: Self-pay | Admitting: Internal Medicine

## 2019-02-06 DIAGNOSIS — E1165 Type 2 diabetes mellitus with hyperglycemia: Secondary | ICD-10-CM | POA: Diagnosis not present

## 2019-02-06 DIAGNOSIS — I10 Essential (primary) hypertension: Secondary | ICD-10-CM | POA: Diagnosis not present

## 2019-02-06 DIAGNOSIS — Z1231 Encounter for screening mammogram for malignant neoplasm of breast: Secondary | ICD-10-CM

## 2019-02-06 DIAGNOSIS — Z1389 Encounter for screening for other disorder: Secondary | ICD-10-CM | POA: Diagnosis not present

## 2019-02-06 DIAGNOSIS — Z0001 Encounter for general adult medical examination with abnormal findings: Secondary | ICD-10-CM | POA: Diagnosis not present

## 2019-02-27 DIAGNOSIS — H401131 Primary open-angle glaucoma, bilateral, mild stage: Secondary | ICD-10-CM | POA: Diagnosis not present

## 2019-02-27 DIAGNOSIS — Z7689 Persons encountering health services in other specified circumstances: Secondary | ICD-10-CM | POA: Diagnosis not present

## 2019-02-28 DIAGNOSIS — Z967 Presence of other bone and tendon implants: Secondary | ICD-10-CM | POA: Diagnosis not present

## 2019-02-28 DIAGNOSIS — Z7689 Persons encountering health services in other specified circumstances: Secondary | ICD-10-CM | POA: Diagnosis not present

## 2019-02-28 DIAGNOSIS — Z96651 Presence of right artificial knee joint: Secondary | ICD-10-CM | POA: Diagnosis not present

## 2019-02-28 DIAGNOSIS — M1712 Unilateral primary osteoarthritis, left knee: Secondary | ICD-10-CM | POA: Diagnosis not present

## 2019-02-28 DIAGNOSIS — M25562 Pain in left knee: Secondary | ICD-10-CM | POA: Diagnosis not present

## 2019-02-28 DIAGNOSIS — Z471 Aftercare following joint replacement surgery: Secondary | ICD-10-CM | POA: Diagnosis not present

## 2019-02-28 DIAGNOSIS — M2460 Ankylosis, unspecified joint: Secondary | ICD-10-CM | POA: Diagnosis not present

## 2019-02-28 DIAGNOSIS — G8929 Other chronic pain: Secondary | ICD-10-CM | POA: Diagnosis not present

## 2019-03-01 ENCOUNTER — Other Ambulatory Visit: Payer: Self-pay | Admitting: Gastroenterology

## 2019-03-06 ENCOUNTER — Other Ambulatory Visit (HOSPITAL_COMMUNITY)
Admission: RE | Admit: 2019-03-06 | Discharge: 2019-03-06 | Disposition: A | Discharge status: Home / Self Care | Payer: Medicare Other | Source: Ambulatory Visit | Attending: Internal Medicine | Admitting: Internal Medicine

## 2019-03-06 ENCOUNTER — Ambulatory Visit (HOSPITAL_COMMUNITY)
Admission: RE | Admit: 2019-03-06 | Discharge: 2019-03-06 | Disposition: A | Discharge status: Home / Self Care | Payer: Medicare Other | Source: Ambulatory Visit | Attending: Internal Medicine | Admitting: Internal Medicine

## 2019-03-06 ENCOUNTER — Other Ambulatory Visit: Payer: Self-pay

## 2019-03-06 DIAGNOSIS — E1165 Type 2 diabetes mellitus with hyperglycemia: Secondary | ICD-10-CM | POA: Diagnosis not present

## 2019-03-06 DIAGNOSIS — Z1231 Encounter for screening mammogram for malignant neoplasm of breast: Secondary | ICD-10-CM

## 2019-03-06 DIAGNOSIS — I10 Essential (primary) hypertension: Secondary | ICD-10-CM | POA: Diagnosis not present

## 2019-03-06 LAB — HEMOGLOBIN A1C
Hgb A1c MFr Bld: 6.7 % — ABNORMAL HIGH (ref 4.8–5.6)
Mean Plasma Glucose: 145.59 mg/dL

## 2019-03-06 LAB — CBC WITH DIFFERENTIAL/PLATELET
Abs Immature Granulocytes: 0.04 10*3/uL (ref 0.00–0.07)
Basophils Absolute: 0.1 10*3/uL (ref 0.0–0.1)
Basophils Relative: 1 %
Eosinophils Absolute: 0.2 10*3/uL (ref 0.0–0.5)
Eosinophils Relative: 2 %
HCT: 39.1 % (ref 36.0–46.0)
Hemoglobin: 12.1 g/dL (ref 12.0–15.0)
Immature Granulocytes: 0 %
Lymphocytes Relative: 18 %
Lymphs Abs: 2.2 10*3/uL (ref 0.7–4.0)
MCH: 25.4 pg — ABNORMAL LOW (ref 26.0–34.0)
MCHC: 30.9 g/dL (ref 30.0–36.0)
MCV: 82 fL (ref 80.0–100.0)
Monocytes Absolute: 0.9 10*3/uL (ref 0.1–1.0)
Monocytes Relative: 7 %
Neutro Abs: 9 10*3/uL — ABNORMAL HIGH (ref 1.7–7.7)
Neutrophils Relative %: 72 %
Platelets: 380 10*3/uL (ref 150–400)
RBC: 4.77 MIL/uL (ref 3.87–5.11)
RDW: 18.2 % — ABNORMAL HIGH (ref 11.5–15.5)
WBC: 12.4 10*3/uL — ABNORMAL HIGH (ref 4.0–10.5)
nRBC: 0 % (ref 0.0–0.2)

## 2019-03-06 LAB — BASIC METABOLIC PANEL
Anion gap: 12 (ref 5–15)
BUN: 19 mg/dL (ref 6–20)
CO2: 23 mmol/L (ref 22–32)
Calcium: 9.8 mg/dL (ref 8.9–10.3)
Chloride: 103 mmol/L (ref 98–111)
Creatinine, Ser: 0.74 mg/dL (ref 0.44–1.00)
GFR calc Af Amer: >60 mL/min (ref >60)
GFR calc non Af Amer: >60 mL/min (ref >60)
Glucose, Bld: 112 mg/dL — ABNORMAL HIGH (ref 70–99)
Potassium: 4.6 mmol/L (ref 3.5–5.1)
Sodium: 138 mmol/L (ref 135–145)

## 2019-03-06 LAB — HEPATIC FUNCTION PANEL
ALT: 27 U/L (ref 0–44)
AST: 24 U/L (ref 15–41)
Albumin: 4.1 g/dL (ref 3.5–5.0)
Alkaline Phosphatase: 106 U/L (ref 38–126)
Bilirubin, Direct: 0.1 mg/dL (ref 0.0–0.2)
Indirect Bilirubin: 0.4 mg/dL (ref 0.3–0.9)
Total Bilirubin: 0.5 mg/dL (ref 0.3–1.2)
Total Protein: 8.1 g/dL (ref 6.5–8.1)

## 2019-03-06 LAB — LIPID PANEL
Cholesterol: 158 mg/dL (ref 0–200)
HDL: 61 mg/dL (ref >40)
LDL Cholesterol: 80 mg/dL (ref 0–99)
Total CHOL/HDL Ratio: 2.6 RATIO
Triglycerides: 84 mg/dL (ref <150)
VLDL: 17 mg/dL (ref 0–40)

## 2019-03-07 ENCOUNTER — Ambulatory Visit: Payer: Medicare Other | Admitting: Orthopaedic Surgery

## 2019-03-07 LAB — MICROALBUMIN, URINE: Microalb, Ur: 10.1 ug/mL — ABNORMAL HIGH

## 2019-03-08 ENCOUNTER — Telehealth: Payer: Self-pay | Admitting: Obstetrics & Gynecology

## 2019-03-08 DIAGNOSIS — E119 Type 2 diabetes mellitus without complications: Secondary | ICD-10-CM | POA: Diagnosis not present

## 2019-03-08 DIAGNOSIS — I10 Essential (primary) hypertension: Secondary | ICD-10-CM | POA: Diagnosis not present

## 2019-03-08 NOTE — Telephone Encounter (Signed)

## 2019-03-09 ENCOUNTER — Ambulatory Visit (INDEPENDENT_AMBULATORY_CARE_PROVIDER_SITE_OTHER): Payer: Medicare Other | Admitting: Obstetrics & Gynecology

## 2019-03-09 ENCOUNTER — Encounter: Payer: Self-pay | Admitting: Obstetrics & Gynecology

## 2019-03-09 ENCOUNTER — Other Ambulatory Visit: Payer: Self-pay

## 2019-03-09 VITALS — BP 136/86 | HR 96 | Ht 62.0 in | Wt 227.5 lb

## 2019-03-09 DIAGNOSIS — Z113 Encounter for screening for infections with a predominantly sexual mode of transmission: Secondary | ICD-10-CM

## 2019-03-09 DIAGNOSIS — M545 Low back pain: Secondary | ICD-10-CM

## 2019-03-09 DIAGNOSIS — N760 Acute vaginitis: Secondary | ICD-10-CM | POA: Diagnosis not present

## 2019-03-09 DIAGNOSIS — G8929 Other chronic pain: Secondary | ICD-10-CM

## 2019-03-09 NOTE — Progress Notes (Signed)
Chief Complaint  Patient presents with  . Gynecologic Exam    std screening      58 y.o. [id number] Patient's last menstrual period was 01/15/2012. The current method of family planning is tubal ligation.  Outpatient Encounter Medications as of 03/09/2019  Medication Sig  . amLODipine (NORVASC) 5 MG tablet   . aspirin 81 MG tablet Take 81 mg by mouth daily.   Marland Kitchen atorvastatin (LIPITOR) 20 MG tablet Take 20 mg by mouth daily.  . bisacodyl (WOMENS LAXATIVE) 5 MG EC tablet Take 5 mg daily as needed by mouth for moderate constipation.  . cyclobenzaprine (FLEXERIL) 10 MG tablet Take 10 mg by mouth as needed for muscle spasms.   . diphenhydrAMINE (BENADRYL) 25 MG tablet Take 1 tablet (25 mg total) by mouth 3 (three) times daily. Take one tablet three times daily for two days  . IBU 800 MG tablet Take 800 mg by mouth 3 (three) times daily.   Marland Kitchen linagliptin (TRADJENTA) 5 MG TABS tablet Take 5 mg by mouth daily.  . metFORMIN (GLUCOPHAGE) 500 MG tablet Take 1,000 mg by mouth 2 (two) times daily.   . pantoprazole (PROTONIX) 40 MG tablet TAKE 1 TABLET BY MOUTH 30 MINUTES BEFORE BREAKFAST DAILY.  . traMADol (ULTRAM) 50 MG tablet Take 50 mg by mouth every 6 (six) hours as needed.  . travoprost, benzalkonium, (TRAVATAN) 0.004 % ophthalmic solution Place 1 drop into both eyes at bedtime.   Marland Kitchen zolpidem (AMBIEN) 10 MG tablet Take 1 tablet (10 mg total) by mouth at bedtime as needed for sleep.  . cetirizine (ZYRTEC) 10 MG tablet Take 10 mg by mouth daily.  . famotidine (PEPCID) 20 MG tablet Take 1 tablet (20 mg total) by mouth 2 (two) times daily. Take one tablet twice daily for two days (Patient not taking: Reported on 03/09/2019)  . lisinopril-hydrochlorothiazide (PRINZIDE,ZESTORETIC) 20-12.5 MG tablet Take 1 tablet by mouth daily.   No facility-administered encounter medications on file as of 03/09/2019.     Subjective Pt has had left lower back pain for a month or so, PCP therapy with tramadol and  flexeril still hurts Mo radiculopathy Also wants STI check Past Medical History:  Diagnosis Date  . Arthritis   . Complication of anesthesia   . Diabetes mellitus ([place])   . DOE (dyspnea on exertion)   . Eczema   . Glaucoma   . Hypercholesterolemia   . Hypertension   . Infection    r knee  . Muscle spasm of back   . Muscle spasms of both lower extremities   . PONV (postoperative nausea and vomiting)   . Positive ANA (antinuclear antibody)    1:180 homogenous pattern    Past Surgical History:  Procedure Laterality Date  . COLONOSCOPY WITH PROPOFOL N/A 06/14/2018   Procedure: COLONOSCOPY WITH PROPOFOL;  Surgeon: Danie Binder, MD;  [facility];  Service: Endoscopy;  Laterality: N/A;  10:00am  . IRRIGATION AND DEBRIDEMENT KNEE Right 05/12/2013   Procedure: IRRIGATION AND DEBRIDEMENT KNEE;  Surgeon: Carole Civil, MD;  Location: [facility];  Service: Orthopedics;  Laterality: Right;  . JOINT REPLACEMENT     r knee  . LIPOMA EXCISION  11/16/2011   Procedure: EXCISION LIPOMA;  Surgeon: Scherry Ran, MD;  Location: [facility];  Service: General;  Laterality: Left;  Excision of neoplasm left shoulder  . MOUTH SURGERY    . MULTIPLE EXTRACTIONS WITH ALVEOLOPLASTY  12/28/2011   Procedure: MULTIPLE EXTRACION WITH  ALVEOLOPLASTY;  Surgeon: Gae Bon, DDS;  Location: [facility];  Service: Oral Surgery;  Laterality: Bilateral;  . PATELLAR TENDON REPAIR Right 05/12/2013   Procedure: PATELLA TENDON REPAIR AND ALLOGRAFT RECONSTRUCTION;  Surgeon: Carole Civil, MD;  Location: [facility];  Service: Orthopedics;  Laterality: Right;  . POLYPECTOMY  06/14/2018   Procedure: POLYPECTOMY;  Surgeon: Danie Binder, MD;  [facility];  Service: Endoscopy;;  . resection arthroplasty right total knee  01/28/16   Dr Starlyn Skeans, [facility]; antibiotic spacer placed  . SHOULDER SURGERY Left   . TOTAL KNEE ARTHROPLASTY Right 04/17/2013   Procedure: RIGHT TOTAL KNEE ARTHROPLASTY;   Surgeon: Carole Civil, MD;  Location: [facility];  Service: Orthopedics;  Laterality: Right;  . TOTAL KNEE ARTHROPLASTY Right 05/12/2013   Procedure: POLY EXCHANGE;  Surgeon: Carole Civil, MD;  Location: [facility];  Service: Orthopedics;  Laterality: Right;  . TUBAL LIGATION     and burned per patient.     OB History    Gravida  5   Para  5   Term  5   Preterm      AB      Living  6     SAB      TAB      Ectopic      Multiple  1   Live Births              Allergies  Allergen Reactions  . Amoxicillin Swelling and Other (See Comments)    Shaking, stomach upset, felt like face was swelling  . Chocolate Hives  . Nystatin Anxiety    Social History   Socioeconomic History  . Marital status: Single    Spouse name: Not on file  . Number of children: Not on file  . Years of education: 95  . Highest education level: Not on file  Occupational History    Employer: NOT EMPLOYED  Social Needs  . Financial resource strain: Not on file  . Food insecurity    Worry: Not on file    Inability: Not on file  . Transportation needs    Medical: Not on file    Non-medical: Not on file  Tobacco Use  . Smoking status: Current Some Day Smoker    Packs/day: 0.10    Years: 1.00    Pack years: 0.10    Types: Cigarettes    Last attempt to quit: 01/28/2016    Years since quitting: 3.1  . Smokeless tobacco: Never Used  Substance and Sexual Activity  . Alcohol use: Yes    Alcohol/week: 0.0 standard drinks    Comment: "once every blue moon"   . Drug use: No  . Sexual activity: Not Currently    Birth control/protection: None  Lifestyle  . Physical activity    Days per week: Not on file    Minutes per session: Not on file  . Stress: Not on file  Relationships  . Social Herbalist on phone: Not on file    Gets together: Not on file    Attends religious service: Not on file    Active member of club or organization: Not on file    Attends meetings of clubs  or organizations: Not on file    Relationship status: Not on file  Other Topics Concern  . Not on file  Social History Narrative  . Not on file    Family History  Problem Relation Age of Onset  .  Diabetes Brother   . Diabetes Mother   . Multiple sclerosis Sister   . Arthritis Neg Hx   . Anesthesia problems Neg Hx   . Hypotension Neg Hx   . Malignant hyperthermia Neg Hx   . Pseudochol deficiency Neg Hx   . Cancer Neg Hx   . Heart disease Neg Hx   . Stroke Neg Hx   . Colon cancer Neg Hx   . Colon polyps Neg Hx     Medications:       Current Outpatient Medications:  .  amLODipine (NORVASC) 5 MG tablet, , Disp: , Rfl:  .  aspirin 81 MG tablet, Take 81 mg by mouth daily. , Disp: , Rfl:  .  atorvastatin (LIPITOR) 20 MG tablet, Take 20 mg by mouth daily., Disp: , Rfl:  .  bisacodyl (WOMENS LAXATIVE) 5 MG EC tablet, Take 5 mg daily as needed by mouth for moderate constipation., Disp: , Rfl:  .  cyclobenzaprine (FLEXERIL) 10 MG tablet, Take 10 mg by mouth as needed for muscle spasms. , Disp: , Rfl:  .  diphenhydrAMINE (BENADRYL) 25 MG tablet, Take 1 tablet (25 mg total) by mouth 3 (three) times daily. Take one tablet three times daily for two days, Disp: 10 tablet, Rfl: 0 .  IBU 800 MG tablet, Take 800 mg by mouth 3 (three) times daily. , Disp: , Rfl:  .  linagliptin (TRADJENTA) 5 MG TABS tablet, Take 5 mg by mouth daily., Disp: , Rfl:  .  metFORMIN (GLUCOPHAGE) 500 MG tablet, Take 1,000 mg by mouth 2 (two) times daily. , Disp: , Rfl:  .  pantoprazole (PROTONIX) 40 MG tablet, TAKE 1 TABLET BY MOUTH 30 MINUTES BEFORE BREAKFAST DAILY., Disp: 90 tablet, Rfl: 3 .  traMADol (ULTRAM) 50 MG tablet, Take 50 mg by mouth every 6 (six) hours as needed., Disp: , Rfl:  .  travoprost, benzalkonium, (TRAVATAN) 0.004 % ophthalmic solution, Place 1 drop into both eyes at bedtime. , Disp: , Rfl:  .  zolpidem (AMBIEN) 10 MG tablet, Take 1 tablet (10 mg total) by mouth at bedtime as needed for sleep.,  Disp: 30 tablet, Rfl: 0 .  cetirizine (ZYRTEC) 10 MG tablet, Take 10 mg by mouth daily., Disp: , Rfl:  .  famotidine (PEPCID) 20 MG tablet, Take 1 tablet (20 mg total) by mouth 2 (two) times daily. Take one tablet twice daily for two days (Patient not taking: Reported on 03/09/2019), Disp: 10 tablet, Rfl: 0 .  lisinopril-hydrochlorothiazide (PRINZIDE,ZESTORETIC) 20-12.5 MG tablet, Take 1 tablet by mouth daily., Disp: , Rfl:   Objective Blood pressure 136/86, pulse 96, height 5\' 2"  (1.575 m), weight 227 lb 8 oz (103.2 kg), last menstrual period 01/15/2012.  General WDWN female NAD Vulva:  normal appearing vulva with no masses, tenderness or lesions Vagina:  normal mucosa, no discharge Cervix:  Normal no lesions Uterus:  normal size, contour, position, consistency, mobility, non-tender Adnexa: ovaries:present,  normal adnexa in size, nontender and no masses  Point tenderness in the left lower back consistent with muscualr source of back pain Pertinent ROS No burning with urination, frequency or urgency No nausea, vomiting or diarrhea Nor fever chills or other constitutional symptoms   Labs or studies pending    Impression Diagnoses this Encounter::   ICD-10-CM   1. Chronic left-sided low back pain without sciatica  M54.5    G89.29   2. Screen for STD (sexually transmitted disease)  Z11.3 NuSwab Vaginitis Plus (VG+)    Established  relevant diagnosis(es):   Plan/Recommendations: No orders of the defined types were placed in this encounter.   Labs or Scans Ordered: Orders Placed This Encounter  Procedures  . NuSwab Vaginitis Plus (VG+)    Management:: >continue flexeril for back pain  >lose weight >Back pain management: [email or web address]-[email or web address] backexercise pamphlet given to patient :[email or web address]-[email or web address]  Follow up Return if symptoms worsen or fail to improve.        All questions were  answered.

## 2019-03-16 ENCOUNTER — Encounter: Payer: Self-pay | Admitting: Orthopaedic Surgery

## 2019-03-16 ENCOUNTER — Ambulatory Visit (INDEPENDENT_AMBULATORY_CARE_PROVIDER_SITE_OTHER): Payer: Medicare Other | Admitting: Orthopaedic Surgery

## 2019-03-16 ENCOUNTER — Other Ambulatory Visit: Payer: Self-pay

## 2019-03-16 DIAGNOSIS — M25512 Pain in left shoulder: Secondary | ICD-10-CM

## 2019-03-16 DIAGNOSIS — G8929 Other chronic pain: Secondary | ICD-10-CM

## 2019-03-16 DIAGNOSIS — M25511 Pain in right shoulder: Secondary | ICD-10-CM

## 2019-03-16 DIAGNOSIS — F17210 Nicotine dependence, cigarettes, uncomplicated: Secondary | ICD-10-CM

## 2019-03-16 NOTE — Patient Instructions (Signed)

## 2019-03-16 NOTE — Progress Notes (Signed)
PROCEDURE NOTE:  The patient request injection, verbal consent was obtained.  The left shoulder was prepped appropriately after time out was performed.   Sterile technique was observed and injection of 1 cc of Depo-Medrol 40 mg with several cc's of plain xylocaine. Anesthesia was provided by ethyl chloride and a 20-gauge needle was used to inject the shoulder area. A posterior approach was used.  The injection was tolerated well.  A band aid dressing was applied.  The patient was advised to apply ice later today and tomorrow to the injection sight as needed.  PROCEDURE NOTE:  The patient request injection, verbal consent was obtained.  The right shoulder was prepped appropriately after time out was performed.   Sterile technique was observed and injection of 1 cc of Depo-Medrol 40 mg with several cc's of plain xylocaine. Anesthesia was provided by ethyl chloride and a 20-gauge needle was used to inject the shoulder area. A posterior approach was used.  The injection was tolerated well.  A band aid dressing was applied.  The patient was advised to apply ice later today and tomorrow to the injection sight as needed.  I will see as needed.  Electronically Signed Sanjuana Kava, MD 7/16/20208:16 AM

## 2019-03-18 LAB — NUSWAB VAGINITIS PLUS (VG+)
Atopobium vaginae: HIGH Score — AB
BVAB 2: HIGH Score — AB
Candida albicans, NAA: NEGATIVE
Candida glabrata, NAA: NEGATIVE
Chlamydia trachomatis, NAA: NEGATIVE
Megasphaera 1: HIGH Score — AB
Neisseria gonorrhoeae, NAA: NEGATIVE
Trich vag by NAA: NEGATIVE

## 2019-03-26 ENCOUNTER — Telehealth: Payer: Self-pay | Admitting: Obstetrics & Gynecology

## 2019-03-26 MED ORDER — METRONIDAZOLE 0.75 % VA GEL: VAGINAL | 0 refills | Status: DC

## 2019-03-27 ENCOUNTER — Telehealth: Payer: Self-pay | Admitting: *Deleted

## 2019-03-27 NOTE — Telephone Encounter (Signed)
Pt aware NuSwab + for BV and med was sent to pharmacy. Pt voiced understanding. [place]

## 2019-03-27 NOTE — Telephone Encounter (Signed)
-----   Message from Florian Buff, MD sent at 03/26/2019  4:47 PM EDT ----- Make sure pt knows a script for metro gel was sent in ----- Message ----- From: Interface, [facility] Lab Results In Sent: 03/17/2019   7:38 AM EDT To: Florian Buff, MD

## 2019-04-05 DIAGNOSIS — I10 Essential (primary) hypertension: Secondary | ICD-10-CM | POA: Diagnosis not present

## 2019-04-05 DIAGNOSIS — E1165 Type 2 diabetes mellitus with hyperglycemia: Secondary | ICD-10-CM | POA: Diagnosis not present

## 2019-05-02 ENCOUNTER — Encounter: Payer: Self-pay | Admitting: Gastroenterology

## 2019-05-06 DIAGNOSIS — E1165 Type 2 diabetes mellitus with hyperglycemia: Secondary | ICD-10-CM | POA: Diagnosis not present

## 2019-05-06 DIAGNOSIS — I10 Essential (primary) hypertension: Secondary | ICD-10-CM | POA: Diagnosis not present

## 2019-06-05 DIAGNOSIS — I10 Essential (primary) hypertension: Secondary | ICD-10-CM | POA: Diagnosis not present

## 2019-06-05 DIAGNOSIS — E1165 Type 2 diabetes mellitus with hyperglycemia: Secondary | ICD-10-CM | POA: Diagnosis not present

## 2019-06-29 DIAGNOSIS — H401131 Primary open-angle glaucoma, bilateral, mild stage: Secondary | ICD-10-CM | POA: Diagnosis not present

## 2019-06-29 DIAGNOSIS — H2513 Age-related nuclear cataract, bilateral: Secondary | ICD-10-CM | POA: Diagnosis not present

## 2019-06-29 DIAGNOSIS — E119 Type 2 diabetes mellitus without complications: Secondary | ICD-10-CM | POA: Diagnosis not present

## 2019-06-29 DIAGNOSIS — Z7689 Persons encountering health services in other specified circumstances: Secondary | ICD-10-CM | POA: Diagnosis not present

## 2019-07-06 DIAGNOSIS — I10 Essential (primary) hypertension: Secondary | ICD-10-CM | POA: Diagnosis not present

## 2019-07-06 DIAGNOSIS — E1165 Type 2 diabetes mellitus with hyperglycemia: Secondary | ICD-10-CM | POA: Diagnosis not present

## 2019-08-02 DIAGNOSIS — M21961 Unspecified acquired deformity of right lower leg: Secondary | ICD-10-CM | POA: Diagnosis not present

## 2019-08-02 DIAGNOSIS — I10 Essential (primary) hypertension: Secondary | ICD-10-CM | POA: Diagnosis not present

## 2019-08-02 DIAGNOSIS — E1165 Type 2 diabetes mellitus with hyperglycemia: Secondary | ICD-10-CM | POA: Diagnosis not present

## 2019-08-30 DIAGNOSIS — I10 Essential (primary) hypertension: Secondary | ICD-10-CM | POA: Diagnosis not present

## 2019-08-30 DIAGNOSIS — E1165 Type 2 diabetes mellitus with hyperglycemia: Secondary | ICD-10-CM | POA: Diagnosis not present

## 2019-09-02 DIAGNOSIS — E1165 Type 2 diabetes mellitus with hyperglycemia: Secondary | ICD-10-CM | POA: Diagnosis not present

## 2019-09-02 DIAGNOSIS — I10 Essential (primary) hypertension: Secondary | ICD-10-CM | POA: Diagnosis not present

## 2019-09-21 ENCOUNTER — Other Ambulatory Visit (HOSPITAL_COMMUNITY): Payer: Medicare Other | Attending: Oral Surgery

## 2019-09-25 ENCOUNTER — Encounter (HOSPITAL_BASED_OUTPATIENT_CLINIC_OR_DEPARTMENT_OTHER): Payer: Self-pay

## 2019-09-25 ENCOUNTER — Ambulatory Visit (HOSPITAL_BASED_OUTPATIENT_CLINIC_OR_DEPARTMENT_OTHER): Admit: 2019-09-25 | Payer: Medicare Other | Admitting: Oral Surgery

## 2019-09-25 SURGERY — DENTAL RESTORATION/EXTRACTIONS
Anesthesia: General | Laterality: Bilateral

## 2019-10-02 ENCOUNTER — Ambulatory Visit (INDEPENDENT_AMBULATORY_CARE_PROVIDER_SITE_OTHER): Payer: Medicare Other | Admitting: Adult Health

## 2019-10-02 ENCOUNTER — Ambulatory Visit: Payer: Medicare Other | Admitting: Adult Health

## 2019-10-02 ENCOUNTER — Other Ambulatory Visit: Payer: Self-pay

## 2019-10-02 ENCOUNTER — Encounter: Payer: Self-pay | Admitting: Adult Health

## 2019-10-02 VITALS — BP 135/83 | HR 100 | Ht 62.0 in | Wt 226.6 lb

## 2019-10-02 DIAGNOSIS — N898 Other specified noninflammatory disorders of vagina: Secondary | ICD-10-CM | POA: Insufficient documentation

## 2019-10-02 DIAGNOSIS — N644 Mastodynia: Secondary | ICD-10-CM | POA: Diagnosis not present

## 2019-10-02 DIAGNOSIS — N760 Acute vaginitis: Secondary | ICD-10-CM | POA: Diagnosis not present

## 2019-10-02 DIAGNOSIS — B9689 Other specified bacterial agents as the cause of diseases classified elsewhere: Secondary | ICD-10-CM

## 2019-10-02 DIAGNOSIS — N6311 Unspecified lump in the right breast, upper outer quadrant: Secondary | ICD-10-CM

## 2019-10-02 LAB — POCT WET PREP (WET MOUNT)
Clue Cells Wet Prep Whiff POC: POSITIVE
Trichomonas Wet Prep HPF POC: ABSENT
WBC, Wet Prep HPF POC: POSITIVE

## 2019-10-02 MED ORDER — METRONIDAZOLE 500 MG PO TABS: 500.0000 mg | ORAL_TABLET | Freq: Two times a day (BID) | ORAL | 0 refills | Status: DC

## 2019-10-02 NOTE — Progress Notes (Signed)
Patient ID: [name], female   DOB: March 15, 1961, 59 y.o.   MRN: [id number] History of Present Illness: [name] is a 59 year old black female, single, PM in complaining of green discharge with some itching. Has had pain on and off right breast.  PCP is Dr Legrand Rams   Current Medications, Allergies, Past Medical History, Past Surgical History, Family History and Social History were reviewed in [facility] record.     Review of Systems: Has green vaginal discharge since last week, with some itching No pain with sex, no new partners Has pain in right breast on and off   Physical Exam:BP 135/83 (BP Location: Left Arm, Patient Position: Sitting, Cuff Size: Normal)   Pulse 100   Ht 5\' 2"  (1.575 m)   Wt 226 lb 9.6 oz (102.8 kg)   LMP 01/15/2012   BMI 41.45 kg/m  General:  Well developed, well nourished, no acute distress Skin:  Warm and dry Breast:  No dominant palpable mass, retraction, or nipple discharge, on the left, on the right has no retraction or nipple discharge, but has 2 cm tender nodule at 11 0' clock, 3 FB from areola Pelvic:  External genitalia is normal in appearance, no lesions.  The vagina is normal in appearance, except has greenish vaginal discharge with slight odor.Marland Kitchen Urethra has no lesions or masses. The cervix is bulbous.  Uterus is felt to be normal size, shape, and contour.  No adnexal masses or tenderness noted.Bladder is non tender, no masses felt.wet prep:+WBC and clue cells and Nuswab obtained  Psych:  No mood changes, alert and cooperative,seems happy Examination chaperoned by Rolena Infante LPN   Impression and Plan: 1. Vaginal discharge Rx flagyl Nuswab sent  2. Vaginal odor Rx flagyl Nuswab sent  3. BV (bacterial vaginosis) Will rx flagyl,no alcohol  Meds ordered this encounter  Medications  . metroNIDAZOLE (FLAGYL) 500 MG tablet    Sig: Take 1 tablet (500 mg total) by mouth 2 (two) times daily.    Dispense:  14 tablet     Refill:  0    Order Specific Question:   Supervising Provider    Answer:   Tania Ade H [[id number]]  Nuswab sent  4. Mass of upper outer quadrant of right breast Right breast US and diag.mammogram 2/9 at 3 pm at [facility]  5. Breast pain, right  Right breast US and diagnostic mammogram 10/10/19 at 3 pm at [facility]

## 2019-10-03 DIAGNOSIS — E1165 Type 2 diabetes mellitus with hyperglycemia: Secondary | ICD-10-CM | POA: Diagnosis not present

## 2019-10-03 DIAGNOSIS — I10 Essential (primary) hypertension: Secondary | ICD-10-CM | POA: Diagnosis not present

## 2019-10-04 ENCOUNTER — Telehealth: Payer: Self-pay | Admitting: Adult Health

## 2019-10-04 ENCOUNTER — Encounter: Payer: Self-pay | Admitting: Adult Health

## 2019-10-04 DIAGNOSIS — A599 Trichomoniasis, unspecified: Secondary | ICD-10-CM | POA: Insufficient documentation

## 2019-10-04 HISTORY — DX: Trichomoniasis, unspecified: A59.9

## 2019-10-04 LAB — NUSWAB VAGINITIS PLUS (VG+)
Candida albicans, NAA: NEGATIVE
Candida glabrata, NAA: NEGATIVE
Chlamydia trachomatis, NAA: NEGATIVE
Neisseria gonorrhoeae, NAA: NEGATIVE
Trich vag by NAA: POSITIVE — AB

## 2019-10-04 NOTE — Telephone Encounter (Signed)
Pt aware that nuswab negative except for +trich already has flagyl, finish it, no sex and POC 2/12/at 12:10 , tell partner he needs to be treated.

## 2019-10-10 ENCOUNTER — Encounter (HOSPITAL_COMMUNITY): Payer: Medicare Other

## 2019-10-10 ENCOUNTER — Ambulatory Visit (HOSPITAL_COMMUNITY): Payer: Medicare Other

## 2019-10-12 ENCOUNTER — Telehealth: Payer: Self-pay | Admitting: Adult Health

## 2019-10-12 NOTE — Telephone Encounter (Signed)

## 2019-10-13 ENCOUNTER — Other Ambulatory Visit: Payer: Self-pay

## 2019-10-13 ENCOUNTER — Encounter: Payer: Self-pay | Admitting: Adult Health

## 2019-10-13 ENCOUNTER — Ambulatory Visit (INDEPENDENT_AMBULATORY_CARE_PROVIDER_SITE_OTHER): Payer: Medicare Other | Admitting: Adult Health

## 2019-10-13 VITALS — BP 152/83 | HR 106 | Ht 62.0 in | Wt 223.0 lb

## 2019-10-13 DIAGNOSIS — Z8619 Personal history of other infectious and parasitic diseases: Secondary | ICD-10-CM | POA: Diagnosis not present

## 2019-10-13 NOTE — Progress Notes (Signed)
  Subjective:     Patient ID: [name], female   DOB: [birth year], 59 y.o.   MRN: [id number]  HPI [name] is a 59 year old black female, single, PM back in for POC of recent trich. PCP is Dr Legrand Rams   Review of Systems No discharge No sex since treatment   Reviewed past medical,surgical, social and family history. Reviewed medications and allergies.     Objective:   Physical Exam BP (!) 152/83 (BP Location: Left Arm, Patient Position: Sitting, Cuff Size: Large)   Pulse (!) 106   Ht 5\' 2"  (1.575 m)   Wt 223 lb (101.2 kg)   LMP 01/15/2012   BMI 40.79 kg/m   Skin warm and dry.Pelvic: external genitalia is normal in appearance no lesions, vagina: no discharge,or odor,urethra has no lesions or masses noted, cervix:smooth and bulbous, uterus: normal size, shape and contour, non tender, no masses felt, adnexa: no masses or tenderness noted. Bladder is non tender and no masses felt. Nuswaab obtained.    Assessment:     1. History of trichomoniasis Nuswab sent    Plan:    She has mammogram 10/24/19 she says, did not get 2/9 Follow up prn

## 2019-10-16 ENCOUNTER — Telehealth: Payer: Self-pay | Admitting: Adult Health

## 2019-10-16 LAB — NUSWAB VAGINITIS PLUS (VG+)
Candida albicans, NAA: NEGATIVE
Candida glabrata, NAA: NEGATIVE
Chlamydia trachomatis, NAA: NEGATIVE
Neisseria gonorrhoeae, NAA: NEGATIVE
Trich vag by NAA: NEGATIVE

## 2019-10-16 NOTE — Telephone Encounter (Signed)
Pt aware that nuswab all negative

## 2019-10-24 ENCOUNTER — Ambulatory Visit (HOSPITAL_COMMUNITY)
Admission: RE | Admit: 2019-10-24 | Discharge: 2019-10-24 | Disposition: A | Discharge status: Home / Self Care | Payer: Medicare Other | Source: Ambulatory Visit | Attending: Adult Health | Admitting: Adult Health

## 2019-10-24 ENCOUNTER — Other Ambulatory Visit: Payer: Self-pay

## 2019-10-24 DIAGNOSIS — N6311 Unspecified lump in the right breast, upper outer quadrant: Secondary | ICD-10-CM | POA: Diagnosis not present

## 2019-10-24 DIAGNOSIS — N6001 Solitary cyst of right breast: Secondary | ICD-10-CM | POA: Diagnosis not present

## 2019-10-24 DIAGNOSIS — N644 Mastodynia: Secondary | ICD-10-CM | POA: Diagnosis not present

## 2019-10-24 DIAGNOSIS — R928 Other abnormal and inconclusive findings on diagnostic imaging of breast: Secondary | ICD-10-CM | POA: Diagnosis not present

## 2019-10-31 DIAGNOSIS — Z7689 Persons encountering health services in other specified circumstances: Secondary | ICD-10-CM | POA: Diagnosis not present

## 2019-10-31 DIAGNOSIS — H401131 Primary open-angle glaucoma, bilateral, mild stage: Secondary | ICD-10-CM | POA: Diagnosis not present

## 2019-11-01 DIAGNOSIS — M1990 Unspecified osteoarthritis, unspecified site: Secondary | ICD-10-CM | POA: Diagnosis not present

## 2019-11-01 DIAGNOSIS — I10 Essential (primary) hypertension: Secondary | ICD-10-CM | POA: Diagnosis not present

## 2019-11-07 DIAGNOSIS — E1165 Type 2 diabetes mellitus with hyperglycemia: Secondary | ICD-10-CM | POA: Diagnosis not present

## 2019-11-07 DIAGNOSIS — Z1389 Encounter for screening for other disorder: Secondary | ICD-10-CM | POA: Diagnosis not present

## 2019-11-07 DIAGNOSIS — I10 Essential (primary) hypertension: Secondary | ICD-10-CM | POA: Diagnosis not present

## 2019-11-07 DIAGNOSIS — Z0001 Encounter for general adult medical examination with abnormal findings: Secondary | ICD-10-CM | POA: Diagnosis not present

## 2019-11-10 ENCOUNTER — Other Ambulatory Visit (HOSPITAL_COMMUNITY)
Admission: RE | Admit: 2019-11-10 | Discharge: 2019-11-10 | Disposition: A | Discharge status: Home / Self Care | Payer: Medicare Other | Source: Ambulatory Visit | Attending: Internal Medicine | Admitting: Internal Medicine

## 2019-11-10 DIAGNOSIS — E1165 Type 2 diabetes mellitus with hyperglycemia: Secondary | ICD-10-CM | POA: Insufficient documentation

## 2019-11-10 DIAGNOSIS — I10 Essential (primary) hypertension: Secondary | ICD-10-CM | POA: Insufficient documentation

## 2019-11-10 LAB — BASIC METABOLIC PANEL
Anion gap: 10 (ref 5–15)
BUN: 14 mg/dL (ref 6–20)
CO2: 23 mmol/L (ref 22–32)
Calcium: 8.9 mg/dL (ref 8.9–10.3)
Chloride: 105 mmol/L (ref 98–111)
Creatinine, Ser: 0.75 mg/dL (ref 0.44–1.00)
GFR calc Af Amer: >60 mL/min (ref >60)
GFR calc non Af Amer: >60 mL/min (ref >60)
Glucose, Bld: 102 mg/dL — ABNORMAL HIGH (ref 70–99)
Potassium: 3.3 mmol/L — ABNORMAL LOW (ref 3.5–5.1)
Sodium: 138 mmol/L (ref 135–145)

## 2019-11-10 LAB — CBC WITH DIFFERENTIAL/PLATELET
Abs Immature Granulocytes: 0.04 10*3/uL (ref 0.00–0.07)
Basophils Absolute: 0.1 10*3/uL (ref 0.0–0.1)
Basophils Relative: 0 %
Eosinophils Absolute: 0.3 10*3/uL (ref 0.0–0.5)
Eosinophils Relative: 2 %
HCT: 33.8 % — ABNORMAL LOW (ref 36.0–46.0)
Hemoglobin: 10.6 g/dL — ABNORMAL LOW (ref 12.0–15.0)
Immature Granulocytes: 0 %
Lymphocytes Relative: 18 %
Lymphs Abs: 2.2 10*3/uL (ref 0.7–4.0)
MCH: 25.5 pg — ABNORMAL LOW (ref 26.0–34.0)
MCHC: 31.4 g/dL (ref 30.0–36.0)
MCV: 81.3 fL (ref 80.0–100.0)
Monocytes Absolute: 0.6 10*3/uL (ref 0.1–1.0)
Monocytes Relative: 5 %
Neutro Abs: 9.4 10*3/uL — ABNORMAL HIGH (ref 1.7–7.7)
Neutrophils Relative %: 75 %
Platelets: 378 10*3/uL (ref 150–400)
RBC: 4.16 MIL/uL (ref 3.87–5.11)
RDW: 17.1 % — ABNORMAL HIGH (ref 11.5–15.5)
WBC: 12.6 10*3/uL — ABNORMAL HIGH (ref 4.0–10.5)
nRBC: 0 % (ref 0.0–0.2)

## 2019-11-10 LAB — LIPID PANEL
Cholesterol: 138 mg/dL (ref 0–200)
HDL: 48 mg/dL (ref >40)
LDL Cholesterol: 78 mg/dL (ref 0–99)
Total CHOL/HDL Ratio: 2.9 RATIO
Triglycerides: 58 mg/dL (ref <150)
VLDL: 12 mg/dL (ref 0–40)

## 2019-11-10 LAB — HEPATIC FUNCTION PANEL
ALT: 21 U/L (ref 0–44)
AST: 20 U/L (ref 15–41)
Albumin: 3.5 g/dL (ref 3.5–5.0)
Alkaline Phosphatase: 107 U/L (ref 38–126)
Bilirubin, Direct: <0.1 mg/dL (ref 0.0–0.2)
Total Bilirubin: 0.4 mg/dL (ref 0.3–1.2)
Total Protein: 7.3 g/dL (ref 6.5–8.1)

## 2019-11-10 LAB — HEMOGLOBIN A1C
Hgb A1c MFr Bld: 6.9 % — ABNORMAL HIGH (ref 4.8–5.6)
Mean Plasma Glucose: 151.33 mg/dL

## 2019-11-11 LAB — MICROALBUMIN / CREATININE URINE RATIO
Creatinine, Urine: 102.1 mg/dL
Microalb Creat Ratio: <3 mg/g creat (ref 0–29)
Microalb, Ur: <3 ug/mL — ABNORMAL HIGH

## 2019-11-14 DIAGNOSIS — L308 Other specified dermatitis: Secondary | ICD-10-CM | POA: Diagnosis not present

## 2019-12-05 ENCOUNTER — Other Ambulatory Visit (HOSPITAL_COMMUNITY)
Admission: RE | Admit: 2019-12-05 | Discharge: 2019-12-05 | Disposition: A | Discharge status: Home / Self Care | Payer: Medicare Other | Source: Ambulatory Visit | Attending: Oral Surgery | Admitting: Oral Surgery

## 2019-12-05 ENCOUNTER — Other Ambulatory Visit: Payer: Self-pay

## 2019-12-05 DIAGNOSIS — Z01812 Encounter for preprocedural laboratory examination: Secondary | ICD-10-CM | POA: Diagnosis not present

## 2019-12-05 DIAGNOSIS — Z20822 Contact with and (suspected) exposure to covid-19: Secondary | ICD-10-CM | POA: Diagnosis not present

## 2019-12-05 NOTE — H&P (Signed)
Patient: [name]    DOB: 11-22-1960  SEX: Female   Patient referred by Raymondo Band, DDS. for ext 6, 11, 18, 27.   CC: pain upper left tooth   Past Medical History:  Diabetes, Hypertension, Morbid Obesity   Medications: Diabetes Medication, Anti-hypertension meds    Allergies:     NKDA    Exam: BMI 43. Decay teeth # 6, tender #11 to touch, decay #27. #18 non percussion tender, no decay.  No purulence, edema, fluctuance, trismus. Oral cancer screening negative. Pharynx clear. No lymphadenopathy.  Panorex: decay #6, PARL #11, #18 no pathology, #27 s/p RCT with fx crown.  Assessment:  ASA 3. Non-restorable teeth #6, 11, 27. Pt elects no ext #18 at this time.              Plan: Extraction Teeth # 6, 11, 27  Local/nitrous.                  Rx: none              Risks and complications explained. Questions answered.   Gae Bon, DMD

## 2019-12-06 LAB — SARS CORONAVIRUS 2 (TAT 6-24 HRS): SARS Coronavirus 2: NEGATIVE

## 2019-12-07 ENCOUNTER — Other Ambulatory Visit: Payer: Self-pay

## 2019-12-07 ENCOUNTER — Encounter (HOSPITAL_COMMUNITY): Payer: Self-pay | Admitting: Oral Surgery

## 2019-12-07 NOTE — Progress Notes (Addendum)
[name] denies chest pain or shortness of breath.  Patient tested negative for Covid 12/05/19 and has been in quarantine since that time. EKG from 2019 was abnormal, I asked Susan Caldwell, PA- C to look at it, he said to get an EKG in AM.  [name] has type II diabetes, she reports fasting CBG runs in the 150's.  I instructed patient to hold Metformin and Tradgenta.  I instructed patient to check CBG after awaking and every 2 hours until arrival  to the hospital.  I Instructed patient if CBG is less than 70 to drink r 1/2 cup of a clear juice, patient says she has apple juice. Recheck in 15 minutes to see if it went up.

## 2019-12-08 ENCOUNTER — Ambulatory Visit (HOSPITAL_COMMUNITY)
Admission: RE | Admit: 2019-12-08 | Discharge: 2019-12-08 | Disposition: A | Discharge status: Home / Self Care | Payer: Medicare Other | Attending: Oral Surgery | Admitting: Oral Surgery

## 2019-12-08 ENCOUNTER — Encounter (HOSPITAL_COMMUNITY): Payer: Self-pay | Admitting: Oral Surgery

## 2019-12-08 ENCOUNTER — Encounter (HOSPITAL_COMMUNITY)
Admission: RE | Disposition: A | Discharge status: Home / Self Care | Payer: Self-pay | Source: Home / Self Care | Attending: Oral Surgery

## 2019-12-08 ENCOUNTER — Ambulatory Visit (HOSPITAL_COMMUNITY): Payer: Medicare Other | Admitting: Physician Assistant

## 2019-12-08 ENCOUNTER — Other Ambulatory Visit: Payer: Self-pay

## 2019-12-08 DIAGNOSIS — Z7984 Long term (current) use of oral hypoglycemic drugs: Secondary | ICD-10-CM | POA: Diagnosis not present

## 2019-12-08 DIAGNOSIS — K029 Dental caries, unspecified: Secondary | ICD-10-CM | POA: Insufficient documentation

## 2019-12-08 DIAGNOSIS — K056 Periodontal disease, unspecified: Secondary | ICD-10-CM | POA: Insufficient documentation

## 2019-12-08 DIAGNOSIS — Z6841 Body Mass Index (BMI) 40.0 and over, adult: Secondary | ICD-10-CM | POA: Insufficient documentation

## 2019-12-08 DIAGNOSIS — I10 Essential (primary) hypertension: Secondary | ICD-10-CM | POA: Diagnosis not present

## 2019-12-08 DIAGNOSIS — E1165 Type 2 diabetes mellitus with hyperglycemia: Secondary | ICD-10-CM | POA: Diagnosis not present

## 2019-12-08 DIAGNOSIS — Z79899 Other long term (current) drug therapy: Secondary | ICD-10-CM | POA: Insufficient documentation

## 2019-12-08 DIAGNOSIS — E119 Type 2 diabetes mellitus without complications: Secondary | ICD-10-CM | POA: Diagnosis not present

## 2019-12-08 HISTORY — DX: Gastro-esophageal reflux disease without esophagitis: K21.9

## 2019-12-08 HISTORY — PX: TOOTH EXTRACTION: SHX859

## 2019-12-08 LAB — BASIC METABOLIC PANEL
Anion gap: 12 (ref 5–15)
BUN: 18 mg/dL (ref 6–20)
CO2: 23 mmol/L (ref 22–32)
Calcium: 9.5 mg/dL (ref 8.9–10.3)
Chloride: 104 mmol/L (ref 98–111)
Creatinine, Ser: 0.81 mg/dL (ref 0.44–1.00)
GFR calc Af Amer: >60 mL/min (ref >60)
GFR calc non Af Amer: >60 mL/min (ref >60)
Glucose, Bld: 121 mg/dL — ABNORMAL HIGH (ref 70–99)
Potassium: 4.3 mmol/L (ref 3.5–5.1)
Sodium: 139 mmol/L (ref 135–145)

## 2019-12-08 LAB — SURGICAL PCR SCREEN
MRSA, PCR: NEGATIVE
Staphylococcus aureus: NEGATIVE

## 2019-12-08 LAB — GLUCOSE, CAPILLARY
Glucose-Capillary: 123 mg/dL — ABNORMAL HIGH (ref 70–99)
Glucose-Capillary: 163 mg/dL — ABNORMAL HIGH (ref 70–99)

## 2019-12-08 SURGERY — DENTAL RESTORATION/EXTRACTIONS
Anesthesia: General | Site: Mouth

## 2019-12-08 MED ORDER — PROPOFOL 10 MG/ML IV BOLUS
INTRAVENOUS | Status: DC | PRN
  Administered 2019-12-08: 150 mg via INTRAVENOUS

## 2019-12-08 MED ORDER — PROPOFOL 10 MG/ML IV BOLUS
INTRAVENOUS | Status: AC
  Filled 2019-12-08: qty 20

## 2019-12-08 MED ORDER — FENTANYL CITRATE (PF) 250 MCG/5ML IJ SOLN
INTRAMUSCULAR | Status: DC | PRN
  Administered 2019-12-08 (×2): 50 ug via INTRAVENOUS

## 2019-12-08 MED ORDER — PROMETHAZINE HCL 25 MG/ML IJ SOLN: 6.2500 mg | INTRAMUSCULAR | Status: DC | PRN

## 2019-12-08 MED ORDER — ACETAMINOPHEN 500 MG PO TABS: 1000.0000 mg | ORAL_TABLET | Freq: Once | ORAL | Status: AC

## 2019-12-08 MED ORDER — OXYCODONE HCL 5 MG/5ML PO SOLN
ORAL | Status: AC
  Filled 2019-12-08: qty 5

## 2019-12-08 MED ORDER — CLINDAMYCIN PHOSPHATE 600 MG/50ML IV SOLN
INTRAVENOUS | Status: AC
  Filled 2019-12-08: qty 50

## 2019-12-08 MED ORDER — PHENYLEPHRINE 40 MCG/ML (10ML) SYRINGE FOR IV PUSH (FOR BLOOD PRESSURE SUPPORT)
PREFILLED_SYRINGE | INTRAVENOUS | Status: DC | PRN
  Administered 2019-12-08: 120 ug via INTRAVENOUS

## 2019-12-08 MED ORDER — ACETAMINOPHEN 500 MG PO TABS
ORAL_TABLET | ORAL | Status: AC
  Administered 2019-12-08: 1000 mg via ORAL
  Filled 2019-12-08: qty 2

## 2019-12-08 MED ORDER — DEXAMETHASONE SODIUM PHOSPHATE 10 MG/ML IJ SOLN
INTRAMUSCULAR | Status: AC
  Filled 2019-12-08: qty 1

## 2019-12-08 MED ORDER — SUCCINYLCHOLINE CHLORIDE 200 MG/10ML IV SOSY
PREFILLED_SYRINGE | INTRAVENOUS | Status: DC | PRN
  Administered 2019-12-08: 100 mg via INTRAVENOUS

## 2019-12-08 MED ORDER — CLINDAMYCIN PHOSPHATE 600 MG/50ML IV SOLN
600.0000 mg | INTRAVENOUS | Status: AC
  Administered 2019-12-08: 600 mg via INTRAVENOUS

## 2019-12-08 MED ORDER — KETOROLAC TROMETHAMINE 30 MG/ML IJ SOLN
30.0000 mg | Freq: Once | INTRAMUSCULAR | Status: AC | PRN
  Administered 2019-12-08: 30 mg via INTRAVENOUS

## 2019-12-08 MED ORDER — DEXAMETHASONE SODIUM PHOSPHATE 10 MG/ML IJ SOLN
INTRAMUSCULAR | Status: DC | PRN
  Administered 2019-12-08: 10 mg via INTRAVENOUS

## 2019-12-08 MED ORDER — OXYCODONE HCL 5 MG PO TABS
5.0000 mg | ORAL_TABLET | Freq: Once | ORAL | Status: AC | PRN
  Administered 2019-12-08: 5 mg via ORAL

## 2019-12-08 MED ORDER — HYDROCODONE-ACETAMINOPHEN 5-325 MG PO TABS: 1.0000 | ORAL_TABLET | Freq: Four times a day (QID) | ORAL | 0 refills | Status: DC | PRN

## 2019-12-08 MED ORDER — ONDANSETRON HCL 4 MG/2ML IJ SOLN
INTRAMUSCULAR | Status: DC | PRN
  Administered 2019-12-08: 4 mg via INTRAVENOUS

## 2019-12-08 MED ORDER — OXYCODONE HCL 5 MG/5ML PO SOLN: 5.0000 mg | Freq: Once | ORAL | Status: AC | PRN

## 2019-12-08 MED ORDER — HYDROMORPHONE HCL 1 MG/ML IJ SOLN: 0.2500 mg | INTRAMUSCULAR | Status: DC | PRN

## 2019-12-08 MED ORDER — FENTANYL CITRATE (PF) 250 MCG/5ML IJ SOLN
INTRAMUSCULAR | Status: AC
  Filled 2019-12-08: qty 5

## 2019-12-08 MED ORDER — OXYCODONE HCL 5 MG PO TABS
ORAL_TABLET | ORAL | Status: AC
  Filled 2019-12-08: qty 1

## 2019-12-08 MED ORDER — MEPERIDINE HCL 25 MG/ML IJ SOLN: 6.2500 mg | INTRAMUSCULAR | Status: DC | PRN

## 2019-12-08 MED ORDER — ONDANSETRON HCL 4 MG/2ML IJ SOLN
INTRAMUSCULAR | Status: AC
  Filled 2019-12-08: qty 2

## 2019-12-08 MED ORDER — LIDOCAINE 2% (20 MG/ML) 5 ML SYRINGE
INTRAMUSCULAR | Status: DC | PRN
  Administered 2019-12-08: 60 mg via INTRAVENOUS

## 2019-12-08 MED ORDER — OXYMETAZOLINE HCL 0.05 % NA SOLN
NASAL | Status: DC | PRN
  Administered 2019-12-08: 2 via NASAL

## 2019-12-08 MED ORDER — SODIUM CHLORIDE 0.9 % IR SOLN
Status: DC | PRN
  Administered 2019-12-08: 1000 mL

## 2019-12-08 MED ORDER — 0.9 % SODIUM CHLORIDE (POUR BTL) OPTIME
TOPICAL | Status: DC | PRN
  Administered 2019-12-08: 1000 mL

## 2019-12-08 MED ORDER — LIDOCAINE-EPINEPHRINE 2 %-1:100000 IJ SOLN
INTRAMUSCULAR | Status: AC
  Filled 2019-12-08: qty 1

## 2019-12-08 MED ORDER — MIDAZOLAM HCL 2 MG/2ML IJ SOLN
INTRAMUSCULAR | Status: AC
  Filled 2019-12-08: qty 2

## 2019-12-08 MED ORDER — MIDAZOLAM HCL 2 MG/2ML IJ SOLN
INTRAMUSCULAR | Status: DC | PRN
  Administered 2019-12-08: 2 mg via INTRAVENOUS

## 2019-12-08 MED ORDER — SCOPOLAMINE 1 MG/3DAYS TD PT72
1.0000 | MEDICATED_PATCH | TRANSDERMAL | Status: DC
  Administered 2019-12-08: 1.5 mg via TRANSDERMAL
  Filled 2019-12-08: qty 1

## 2019-12-08 MED ORDER — LIDOCAINE-EPINEPHRINE 2 %-1:100000 IJ SOLN
INTRAMUSCULAR | Status: DC | PRN
  Administered 2019-12-08: 7 mL

## 2019-12-08 MED ORDER — LACTATED RINGERS IV SOLN: INTRAVENOUS | Status: DC

## 2019-12-08 MED ORDER — LIDOCAINE 2% (20 MG/ML) 5 ML SYRINGE
INTRAMUSCULAR | Status: AC
  Filled 2019-12-08: qty 5

## 2019-12-08 MED ORDER — KETOROLAC TROMETHAMINE 30 MG/ML IJ SOLN
INTRAMUSCULAR | Status: AC
  Filled 2019-12-08: qty 1

## 2019-12-08 SURGICAL SUPPLY — 40 items
BLADE SURG 15 STRL LF DISP TIS (BLADE) ×1 IMPLANT
BLADE SURG 15 STRL SS (BLADE) ×3
BUR CROSS CUT FISSURE 1.6 (BURR) ×2 IMPLANT
BUR CROSS CUT FISSURE 1.6MM (BURR) ×1
BUR EGG ELITE 4.0 (BURR) ×2 IMPLANT
BUR EGG ELITE 4.0MM (BURR) ×1
CANISTER SUCT 3000ML PPV (MISCELLANEOUS) ×3 IMPLANT
COVER SURGICAL LIGHT HANDLE (MISCELLANEOUS) ×3 IMPLANT
COVER WAND RF STERILE (DRAPES) ×1 IMPLANT
DECANTER SPIKE VIAL GLASS SM (MISCELLANEOUS) ×3 IMPLANT
DRAPE U-SHAPE 76X120 STRL (DRAPES) ×3 IMPLANT
GAUZE PACKING FOLDED 2  STR (GAUZE/BANDAGES/DRESSINGS) ×3
GAUZE PACKING FOLDED 2 STR (GAUZE/BANDAGES/DRESSINGS) ×1 IMPLANT
GLOVE BIO SURGEON STRL SZ 6.5 (GLOVE) IMPLANT
GLOVE BIO SURGEON STRL SZ7 (GLOVE) IMPLANT
GLOVE BIO SURGEON STRL SZ7.5 (GLOVE) ×3 IMPLANT
GLOVE BIO SURGEONS STRL SZ 6.5 (GLOVE)
GLOVE BIOGEL PI IND STRL 6.5 (GLOVE) IMPLANT
GLOVE BIOGEL PI IND STRL 7.0 (GLOVE) IMPLANT
GLOVE BIOGEL PI INDICATOR 6.5 (GLOVE)
GLOVE BIOGEL PI INDICATOR 7.0 (GLOVE)
GOWN STRL REUS W/ TWL LRG LVL3 (GOWN DISPOSABLE) ×1 IMPLANT
GOWN STRL REUS W/ TWL XL LVL3 (GOWN DISPOSABLE) ×1 IMPLANT
GOWN STRL REUS W/TWL LRG LVL3 (GOWN DISPOSABLE) ×3
GOWN STRL REUS W/TWL XL LVL3 (GOWN DISPOSABLE) ×3
IV NS 1000ML (IV SOLUTION) ×3
IV NS 1000ML BAXH (IV SOLUTION) ×1 IMPLANT
KIT BASIN OR (CUSTOM PROCEDURE TRAY) ×3 IMPLANT
KIT TURNOVER KIT B (KITS) ×3 IMPLANT
NDL HYPO 25GX1X1/2 BEV (NEEDLE) ×2 IMPLANT
NEEDLE HYPO 25GX1X1/2 BEV (NEEDLE) ×6 IMPLANT
NS IRRIG 1000ML POUR BTL (IV SOLUTION) ×3 IMPLANT
PAD ARMBOARD 7.5X6 YLW CONV (MISCELLANEOUS) ×3 IMPLANT
SLEEVE IRRIGATION ELITE 7 (MISCELLANEOUS) ×3 IMPLANT
SPONGE SURGIFOAM ABS GEL 12-7 (HEMOSTASIS) IMPLANT
SUT CHROMIC 3 0 PS 2 (SUTURE) ×3 IMPLANT
SYR CONTROL 10ML LL (SYRINGE) ×3 IMPLANT
TRAY ENT MC OR (CUSTOM PROCEDURE TRAY) ×3 IMPLANT
TUBING IRRIGATION (MISCELLANEOUS) ×3 IMPLANT
YANKAUER SUCT BULB TIP NO VENT (SUCTIONS) ×3 IMPLANT

## 2019-12-08 NOTE — Transfer of Care (Signed)
Immediate Anesthesia Transfer of Care Note  Patient: [name]  Procedure(s) Performed: DENTAL RESTORATION/EXTRACTIONS OF TEETH # SIX, ELEVEN, AND TWENTY-SEVEN (N/A Mouth)  Patient Location: PACU  Anesthesia Type:General  Level of Consciousness: awake and alert   Airway & Oxygen Therapy: Patient Spontanous Breathing and Patient connected to face mask oxygen  Post-op Assessment: Report given to RN and Post -op Vital signs reviewed and stable  Post vital signs: Reviewed and stable  Last Vitals:  Vitals Value Taken Time  BP 115/64   Temp    Pulse 94 12/08/19 1003  Resp 20 12/08/19 1003  SpO2 96 % 12/08/19 1003  Vitals shown include unvalidated device data.  Last Pain:  Vitals:   12/08/19 1002  TempSrc:   PainSc: (P) 0-No pain         Complications: No apparent anesthesia complications

## 2019-12-08 NOTE — Anesthesia Preprocedure Evaluation (Addendum)
Anesthesia Evaluation  Patient identified by MRN, date of birth, ID band Patient awake    Reviewed: Allergy & Precautions, H&P , NPO status , Patient's Chart, lab work & pertinent test results, reviewed documented beta blocker date and time   History of Anesthesia Complications (+) PONV and history of anesthetic complications  Airway Mallampati: I  TM Distance: >3 FB Neck ROM: full    Dental no notable dental hx. (+) Dental Advisory Given, Poor Dentition, Missing, Chipped   Pulmonary Current Smoker and Patient abstained from smoking.,  2 cigg/d   Pulmonary exam normal breath sounds clear to auscultation       Cardiovascular Exercise Tolerance: Good hypertension, Pt. on medications + DOE   Rhythm:regular Rate:Normal  HLD   Neuro/Psych negative neurological ROS  negative psych ROS   GI/Hepatic Neg liver ROS, GERD  Medicated and Controlled,  Endo/Other  diabetes, Type 2, Oral Hypoglycemic AgentsMorbid obesityBMI 39  Renal/GU negative Renal ROS  negative genitourinary   Musculoskeletal  (+) Arthritis , Osteoarthritis,    Abdominal (+) + obese,   Peds  Hematology negative hematology ROS (+)   Anesthesia Other Findings Dental caries  Reproductive/Obstetrics negative OB ROS                           Anesthesia Physical Anesthesia Plan  ASA: III  Anesthesia Plan: General   Post-op Pain Management:    Induction: Intravenous  PONV Risk Score and Plan: 4 or greater and Ondansetron, Dexamethasone, Midazolam, Scopolamine patch - Pre-op, Diphenhydramine, Treatment may vary due to age or medical condition and Propofol infusion  Airway Management Planned: Nasal ETT  Additional Equipment: None  Intra-op Plan:   Post-operative Plan: Extubation in OR  Informed Consent: I have reviewed the patients History and Physical, chart, labs and discussed the procedure including the risks, benefits and  alternatives for the proposed anesthesia with the patient or authorized representative who has indicated his/her understanding and acceptance.     Dental advisory given  Plan Discussed with: CRNA  Anesthesia Plan Comments:         Anesthesia Quick Evaluation

## 2019-12-08 NOTE — Anesthesia Postprocedure Evaluation (Signed)
Anesthesia Post Note  Patient: [name]  Procedure(s) Performed: DENTAL RESTORATION/EXTRACTIONS OF TEETH # SIX, ELEVEN, AND TWENTY-SEVEN (N/A Mouth)     Patient location during evaluation: PACU Anesthesia Type: General Level of consciousness: awake and alert, oriented and patient cooperative Pain management: pain level controlled Vital Signs Assessment: post-procedure vital signs reviewed and stable Respiratory status: spontaneous breathing, nonlabored ventilation and respiratory function stable Cardiovascular status: blood pressure returned to baseline and stable Postop Assessment: no apparent nausea or vomiting Anesthetic complications: no    Last Vitals:  Vitals:   12/08/19 1017 12/08/19 1018  BP: 118/70 107/72  Pulse: 90 89  Resp: (!) 22 20  Temp:  36.6 C  SpO2: 93% 94%    Last Pain:  Vitals:   12/08/19 1018  TempSrc:   PainSc: 0-No pain                 Jarome Matin Shulem Mader

## 2019-12-08 NOTE — Op Note (Signed)
12/08/2019  9:52 AM  PATIENT:  [name]  59 y.o. female  PRE-OPERATIVE DIAGNOSIS:  NONRESTORABLE TEETH # 6, 11, 27  POST-OPERATIVE DIAGNOSIS:  SAME  PROCEDURE:  Procedure(s): DENTAL RESTORATION/EXTRACTIONS OF TEETH # SIX, ELEVEN, AND TWENTY-SEVEN; ALVEOLOPLASTY RIGHT AND LEFT MAXILLA  SURGEON:  Surgeon(s): Diona Browner, DDS  ANESTHESIA:   local and general  EBL:  minimal  DRAINS: none   SPECIMEN:  No Specimen  COUNTS:  YES  PLAN OF CARE: Discharge to home after PACU  PATIENT DISPOSITION:  PACU - hemodynamically stable.   PROCEDURE DETAILS: Dictation # [id number]  Gae Bon, DMD 12/08/2019 9:52 AM

## 2019-12-08 NOTE — Anesthesia Procedure Notes (Addendum)
Procedure Name: Intubation Date/Time: 12/08/2019 8:29 AM Performed by: Bryson Corona, CRNA Pre-anesthesia Checklist: Patient identified, Emergency Drugs available, Suction available and Patient being monitored Patient Re-evaluated:Patient Re-evaluated prior to induction Oxygen Delivery Method: Circle System Utilized Preoxygenation: Pre-oxygenation with 100% oxygen Induction Type: IV induction Ventilation: Mask ventilation without difficulty Laryngoscope Size: Mac and 3 Grade View: Grade I Tube type: Oral Tube size: 7.0 mm Number of attempts: 1 Airway Equipment and Method: Stylet Placement Confirmation: ETT inserted through vocal cords under direct vision,  positive ETCO2 and breath sounds checked- equal and bilateral Secured at: 22 cm Tube secured with: Tape Dental Injury: Teeth and Oropharynx as per pre-operative assessment  Comments: CRNA and MD tried to pass 7.0 Nasal ETT and unable to pass back of throat. Lots of resistance in both nares.

## 2019-12-08 NOTE — H&P (Signed)
H&P documentation  -History and Physical Reviewed  -Patient has been re-examined  -No change in the plan of care  Antrone Walla  

## 2019-12-08 NOTE — Op Note (Signed)
NAMESANVITHA, NAPLES MEDICAL RECORD [id number] ACCOUNT [id number] DATE OF BIRTH:1960/12/22 FACILITY: [facility] LOCATION: [facility] PHYSICIAN:Vanesha Athens M. Makaylin Carlo, DDS  OPERATIVE REPORT  DATE OF PROCEDURE:  12/08/2019  PREOPERATIVE DIAGNOSIS:  Nonrestorable teeth secondary to dental caries and periodontal disease numbers 6, 11 and 27.  POSTOPERATIVE DIAGNOSIS:  Nonrestorable teeth secondary to dental caries and periodontal disease numbers 6, 11 and 27.  PROCEDURES:   1.  Extraction of teeth 6, 11 and 27. 2.  Alveoplasty right and left maxilla.  SURGEON:  Diona Browner, DDS  ANESTHESIA:  General oral intubation, Dr. Doroteo Glassman.  DESCRIPTION OF PROCEDURE:  The patient was taken to the operating room and placed on the table in the supine position.  General anesthesia was administered intravenously and an oral endotracheal tube was placed and secured.  The eyes were protected and  the patient was draped for surgery.  A timeout was performed.  The posterior pharynx was suctioned and a throat pack was placed.  Two percent lidocaine 1:100,000 epinephrine was infiltrated in a right inferior alveolar block and then buccally and  palatally around teeth numbers 6 and 11.  A bite block was placed in the right side of the mouth and a sweetheart was used to retract the tongue.  A #15 blade was used to make an incision around tooth #11.  The periosteum was reflected.  The tooth was  elevated with a 301 elevator and then removed with the dental forceps.  The socket was curetted.  Then, the canine prominence was felt to be over-contoured such that there was constant undercut, so decision was made to perform an alveoplasty.  A 15 blade  was used to make an incision along the alveolar crest 1 cm proximal and distal to the socket.  The periosteum was reflected to expose the irregular bone.  The bone was removed with a rongeur and smoothed with a bone file.  Then, the area was irrigated  and closed with 3-0  chromic.  Then, the endotracheal tube was repositioned to the left side of the mouth and the throat pack was removed prior to and then replaced after the repositioning of the tube.  Attention was turned to teeth numbers 6 and 27.  A  15 blade was used to make an incision around the teeth.  The periosteum was reflected.  The teeth were removed using the dental forceps.  Sockets were curetted.  Once again the canine prominence in the area of tooth #6 was felt to have caused an  undercut, so the tissue was incised 1 cm proximal and distal to the tooth #6 socket on the alveolar crest.  The periosteum was reflected and irregular bone was removed using the rongeur and then the bone file.  Then, the area was irrigated and closed  with 3-0 chromic.  Tooth #27 did not require suture.  Then, the oral cavity was irrigated and suctioned and the throat pack was removed.  The patient was left in care of anesthesia for extubation and transported to the recovery room with plans for  discharge home through day surgery.  ESTIMATED BLOOD LOSS:  Minimal.  COMPLICATIONS:  None.  SPECIMENS:  None.  VN/NUANCE  D:12/08/2019 T:12/08/2019 JOB:[id number]

## 2020-01-07 DIAGNOSIS — E1165 Type 2 diabetes mellitus with hyperglycemia: Secondary | ICD-10-CM | POA: Diagnosis not present

## 2020-01-07 DIAGNOSIS — I10 Essential (primary) hypertension: Secondary | ICD-10-CM | POA: Diagnosis not present

## 2020-02-07 DIAGNOSIS — E1165 Type 2 diabetes mellitus with hyperglycemia: Secondary | ICD-10-CM | POA: Diagnosis not present

## 2020-02-07 DIAGNOSIS — I10 Essential (primary) hypertension: Secondary | ICD-10-CM | POA: Diagnosis not present

## 2020-02-23 ENCOUNTER — Other Ambulatory Visit: Payer: Self-pay | Admitting: Gastroenterology

## 2020-02-27 ENCOUNTER — Encounter: Payer: Self-pay | Admitting: Gastroenterology

## 2020-02-27 NOTE — Telephone Encounter (Signed)
[name], needs office visit before any additional refills. I refilled X 1.

## 2020-02-27 NOTE — Telephone Encounter (Signed)
SENT LETTER THAT PATIENT NEEDS TO CALL FOR APPOINTMENT

## 2020-03-01 DIAGNOSIS — Z7689 Persons encountering health services in other specified circumstances: Secondary | ICD-10-CM | POA: Diagnosis not present

## 2020-03-01 DIAGNOSIS — H401131 Primary open-angle glaucoma, bilateral, mild stage: Secondary | ICD-10-CM | POA: Diagnosis not present

## 2020-03-08 DIAGNOSIS — E1165 Type 2 diabetes mellitus with hyperglycemia: Secondary | ICD-10-CM | POA: Diagnosis not present

## 2020-03-08 DIAGNOSIS — I10 Essential (primary) hypertension: Secondary | ICD-10-CM | POA: Diagnosis not present

## 2020-03-27 DIAGNOSIS — T84092A Other mechanical complication of internal right knee prosthesis, initial encounter: Secondary | ICD-10-CM | POA: Diagnosis not present

## 2020-03-27 DIAGNOSIS — Z471 Aftercare following joint replacement surgery: Secondary | ICD-10-CM | POA: Diagnosis not present

## 2020-03-27 DIAGNOSIS — M25562 Pain in left knee: Secondary | ICD-10-CM | POA: Diagnosis not present

## 2020-03-27 DIAGNOSIS — Z7689 Persons encountering health services in other specified circumstances: Secondary | ICD-10-CM | POA: Diagnosis not present

## 2020-03-27 DIAGNOSIS — G8929 Other chronic pain: Secondary | ICD-10-CM | POA: Diagnosis not present

## 2020-03-27 DIAGNOSIS — Z96651 Presence of right artificial knee joint: Secondary | ICD-10-CM | POA: Diagnosis not present

## 2020-04-08 DIAGNOSIS — E1165 Type 2 diabetes mellitus with hyperglycemia: Secondary | ICD-10-CM | POA: Diagnosis not present

## 2020-04-08 DIAGNOSIS — I10 Essential (primary) hypertension: Secondary | ICD-10-CM | POA: Diagnosis not present

## 2020-04-25 ENCOUNTER — Other Ambulatory Visit: Payer: Self-pay | Admitting: Gastroenterology

## 2020-04-26 ENCOUNTER — Other Ambulatory Visit: Payer: Self-pay

## 2020-04-26 ENCOUNTER — Encounter: Payer: Self-pay | Admitting: Gastroenterology

## 2020-04-26 ENCOUNTER — Ambulatory Visit (INDEPENDENT_AMBULATORY_CARE_PROVIDER_SITE_OTHER): Payer: Medicare Other | Admitting: Gastroenterology

## 2020-04-26 VITALS — BP 132/85 | HR 91 | Temp 98.7°F | Ht 62.0 in | Wt 214.2 lb

## 2020-04-26 DIAGNOSIS — K219 Gastro-esophageal reflux disease without esophagitis: Secondary | ICD-10-CM | POA: Diagnosis not present

## 2020-04-26 MED ORDER — PANTOPRAZOLE SODIUM 40 MG PO TBEC: DELAYED_RELEASE_TABLET | ORAL | 3 refills | Status: DC

## 2020-04-26 NOTE — Patient Instructions (Signed)
We will see you back in 2 years or sooner if needed! I sent Protonix to the pharmacy for a year supply. We can refill it after that.   Please call if any concerns or changes in the meantime!  Your next colonoscopy will be in 2024.  I enjoyed seeing you again today! As you know, I value our relationship and want to provide genuine, compassionate, and quality care. I welcome your feedback. If you receive a survey regarding your visit,  I greatly appreciate you taking time to fill this out. See you next time!  Annitta Needs, PhD, ANP-BC [facility]

## 2020-04-26 NOTE — Progress Notes (Signed)
Referring Provider: Rosita Fire, MD Primary Care Physician:  Rosita Fire, MD Primary GI: Dr. Abbey Chatters   Chief Complaint  Patient presents with  . Follow-up    GERD    HPI:   [name] is a 59 y.o. female presenting today with a history of GERD, hyperplastic polyps in 2019 and due for early interval colonoscopy 2024 due to poor prep in right colon. Here for refills and routine visit.   GERD well-controlled on Protonix daily. No dysphagia. No abdominal pain. No constipation. No N/V. No overt GI bleeding. Good appetite. Intentional weight loss. Goes walking early in the morning. No GI Complaints today.   Past Medical History:  Diagnosis Date  . Arthritis   . Complication of anesthesia   . Diabetes mellitus ([place])    Type II  . DOE (dyspnea on exertion)   . Eczema   . GERD (gastroesophageal reflux disease)   . Glaucoma   . Hypercholesterolemia   . Hypertension   . Infection    r knee  . Muscle spasm of back   . Muscle spasms of both lower extremities   . PONV (postoperative nausea and vomiting)   . Positive ANA (antinuclear antibody)    1:180 homogenous pattern  . Trichimoniasis 10/04/2019   Treated 10/02/19 PCO______    Past Surgical History:  Procedure Laterality Date  . COLONOSCOPY WITH PROPOFOL N/A 06/14/2018   Five sessile polyps in rectum and sigmoid, 4-6 mm in size. Few small and large mouthed diverticula in recto-sigmoid, sigmoid colon, and descending colon. Internal hemorrhoids. Hyperplastic polyps. Colonoscopy in 2024 as prep in right colon not ideal.   . IRRIGATION AND DEBRIDEMENT KNEE Right 05/12/2013   Procedure: IRRIGATION AND DEBRIDEMENT KNEE;  Surgeon: Carole Civil, MD;  Location: [facility];  Service: Orthopedics;  Laterality: Right;  . JOINT REPLACEMENT     r knee  . LIPOMA EXCISION  11/16/2011   Procedure: EXCISION LIPOMA;  Surgeon: Scherry Ran, MD;  Location: [facility];  Service: General;  Laterality: Left;  Excision of neoplasm  left shoulder  . MOUTH SURGERY    . MULTIPLE EXTRACTIONS WITH ALVEOLOPLASTY  12/28/2011   Procedure: MULTIPLE EXTRACION WITH ALVEOLOPLASTY;  Surgeon: Gae Bon, DDS;  Location: [place];  Service: Oral Surgery;  Laterality: Bilateral;  . PATELLAR TENDON REPAIR Right 05/12/2013   Procedure: PATELLA TENDON REPAIR AND ALLOGRAFT RECONSTRUCTION;  Surgeon: Carole Civil, MD;  Location: [facility];  Service: Orthopedics;  Laterality: Right;  . POLYPECTOMY  06/14/2018   Procedure: POLYPECTOMY;  Surgeon: Danie Binder, MD;  Location: [facility];  Service: Endoscopy;;  . resection arthroplasty right total knee  01/28/16   Dr Starlyn Skeans, [facility]; antibiotic spacer placed  . SHOULDER SURGERY Left   . TOOTH EXTRACTION N/A 12/08/2019   Procedure: DENTAL RESTORATION/EXTRACTIONS OF TEETH # SIX, ELEVEN, AND TWENTY-SEVEN;  Surgeon: Diona Browner, DDS;  Location: [place];  Service: Oral Surgery;  Laterality: N/A;  . TOTAL KNEE ARTHROPLASTY Right 04/17/2013   Procedure: RIGHT TOTAL KNEE ARTHROPLASTY;  Surgeon: Carole Civil, MD;  Location: [facility];  Service: Orthopedics;  Laterality: Right;  . TOTAL KNEE ARTHROPLASTY Right 05/12/2013   Procedure: POLY EXCHANGE;  Surgeon: Carole Civil, MD;  Location: [facility];  Service: Orthopedics;  Laterality: Right;  . TUBAL LIGATION     and burned per patient.     Current Outpatient Medications  Medication Sig Dispense Refill  . ACCU-CHEK AVIVA PLUS test strip TEST TWICE  DAILY ASADIRECTED.N    . Accu-Chek Softclix Lancets lancets TEST TWICE DAILY ASTDIRECTED.    Marland Kitchen amLODipine (NORVASC) 5 MG tablet Take 5 mg by mouth daily.     Marland Kitchen aspirin 81 MG tablet Take 81 mg by mouth daily.     Marland Kitchen atorvastatin (LIPITOR) 20 MG tablet Take 20 mg by mouth daily.    . bisacodyl (WOMENS LAXATIVE) 5 MG EC tablet Take 5 mg daily as needed by mouth for moderate constipation.    . cyclobenzaprine (FLEXERIL) 10 MG tablet Take 10 mg by mouth as needed for muscle spasms.     .  diphenhydrAMINE (BENADRYL) 25 MG tablet Take 1 tablet (25 mg total) by mouth 3 (three) times daily. Take one tablet three times daily for two days (Patient taking differently: Take 25 mg by mouth as needed for itching (Rash). ) 10 tablet 0  . Fluocinolone Acetonide Scalp 0.01 % OIL Apply 1 application topically at bedtime as needed (Eczema in hair).     . hydrochlorothiazide (HYDRODIURIL) 12.5 MG tablet Take 12.5 mg by mouth daily.    . IBU 800 MG tablet Take 800 mg by mouth daily as needed for mild pain or moderate pain.     Marland Kitchen linagliptin (TRADJENTA) 5 MG TABS tablet Take 5 mg by mouth daily.    . metFORMIN (GLUCOPHAGE) 500 MG tablet Take 1,000 mg by mouth 2 (two) times daily.     . pantoprazole (PROTONIX) 40 MG tablet TAKE 1 TABLET BY MOUTH 30 MINUTES BEFORE BREAKFAST DAILY. 90 tablet 0  . traMADol (ULTRAM) 50 MG tablet Take by mouth in the morning and at bedtime.    . travoprost, benzalkonium, (TRAVATAN) 0.004 % ophthalmic solution Place 1 drop into both eyes at bedtime.     . triamcinolone cream (KENALOG) 0.1 % Apply 1 application topically as needed (Eczema).     . zolpidem (AMBIEN) 10 MG tablet Take 1 tablet (10 mg total) by mouth at bedtime as needed for sleep. 30 tablet 0  . HYDROcodone-acetaminophen (NORCO) 5-325 MG tablet Take 1 tablet by mouth every 6 (six) hours as needed for moderate pain. (Patient not taking: Reported on 04/26/2020) 20 tablet 0   No current facility-administered medications for this visit.    Allergies as of 04/26/2020 - Review Complete 04/26/2020  Allergen Reaction Noted  . Amoxicillin Swelling and Other (See Comments) 03/22/2018  . Chocolate Hives 11/13/2011  . Nystatin Anxiety 02/06/2016    Family History  Problem Relation Age of Onset  . Diabetes Brother   . Diabetes Mother   . Multiple sclerosis Sister   . Arthritis Neg Hx   . Anesthesia problems Neg Hx   . Hypotension Neg Hx   . Malignant hyperthermia Neg Hx   . Pseudochol deficiency Neg Hx   .  Cancer Neg Hx   . Heart disease Neg Hx   . Stroke Neg Hx   . Colon cancer Neg Hx   . Colon polyps Neg Hx     Social History   Socioeconomic History  . Marital status: Single    Spouse name: Not on file  . Number of children: Not on file  . Years of education: 71  . Highest education level: Not on file  Occupational History    Employer: NOT EMPLOYED  Tobacco Use  . Smoking status: Current Some Day Smoker    Packs/day: 0.10    Years: 1.00    Pack years: 0.10    Types: Cigarettes    Last  attempt to quit: 01/28/2016    Years since quitting: 4.2  . Smokeless tobacco: Never Used  . Tobacco comment: rare   Vaping Use  . Vaping Use: Never used  Substance and Sexual Activity  . Alcohol use: Yes    Alcohol/week: 2.0 standard drinks    Types: 2 Cans of beer per week    Comment: "once every blue moon"   . Drug use: No  . Sexual activity: Not Currently    Birth control/protection: Post-menopausal, Surgical    Comment: tubal  Other Topics Concern  . Not on file  Social History Narrative  . Not on file   Social Determinants of Health   Financial Resource Strain:   . Difficulty of Paying Living Expenses: Not on file  Food Insecurity:   . Worried About Charity fundraiser in the Last Year: Not on file  . Ran Out of Food in the Last Year: Not on file  Transportation Needs:   . Lack of Transportation (Medical): Not on file  . Lack of Transportation (Non-Medical): Not on file  Physical Activity:   . Days of Exercise per Week: Not on file  . Minutes of Exercise per Session: Not on file  Stress:   . Feeling of Stress : Not on file  Social Connections:   . Frequency of Communication with Friends and Family: Not on file  . Frequency of Social Gatherings with Friends and Family: Not on file  . Attends Religious Services: Not on file  . Active Member of Clubs or Organizations: Not on file  . Attends Archivist Meetings: Not on file  . Marital Status: Not on file     Review of Systems: Gen: Denies fever, chills, anorexia. Denies fatigue, weakness, weight loss.  CV: Denies chest pain, palpitations, syncope, peripheral edema, and claudication. Resp: Denies dyspnea at rest, cough, wheezing, coughing up blood, and pleurisy. GI: see HPI Derm: Denies rash, itching, dry skin Psych: Denies depression, anxiety, memory loss, confusion. No homicidal or suicidal ideation.  Heme: Denies bruising, bleeding, and enlarged lymph nodes.  Physical Exam: BP 132/85   Pulse 91   Temp 98.7 F (37.1 C) (Oral)   Ht 5\' 2"  (1.575 m)   Wt 214 lb 3.2 oz (97.2 kg)   LMP 01/15/2012   BMI 39.18 kg/m  General:   Alert and oriented. No distress noted. Pleasant and cooperative.  Head:  Normocephalic and atraumatic. Eyes:  Conjuctiva clear without scleral icterus. Mouth: mask in place Cardiac: S1 S2 present with soft systolic murmur Lungs: clear bilaterally Abdomen:  +BS, soft, non-tender and non-distended. No rebound or guarding. No HSM or masses noted. Msk:  Symmetrical without gross deformities. Normal posture. Extremities:  Without edema. Neurologic:  Alert and  oriented x4 Psych:  Alert and cooperative. Normal mood and affect.  ASSESSMENT/PLAN: LATRESHIA BEAUCHAINE is a 59 y.o. female presenting today with history of chronic GERD, doing well on Protonix daily without alarm signs/symptoms.   Colonoscopy will be due in 2024, with last in 2019 and poor prep right colon. Hyperplastic polyps noted. No concerning lower GI signs/symptoms.  Refills provided today. We will see her back in 2 years for routine follow-up, as she is doing well. She is to call with any concerns in the interim.  Annitta Needs, PhD, ANP-BC [facility]

## 2020-04-30 ENCOUNTER — Ambulatory Visit (INDEPENDENT_AMBULATORY_CARE_PROVIDER_SITE_OTHER): Payer: Medicare Other | Admitting: Adult Health

## 2020-04-30 ENCOUNTER — Other Ambulatory Visit (HOSPITAL_COMMUNITY)
Admission: RE | Admit: 2020-04-30 | Discharge: 2020-04-30 | Disposition: A | Discharge status: Home / Self Care | Payer: Medicare Other | Source: Ambulatory Visit | Attending: Adult Health | Admitting: Adult Health

## 2020-04-30 ENCOUNTER — Encounter: Payer: Self-pay | Admitting: Adult Health

## 2020-04-30 VITALS — BP 132/85 | HR 94 | Ht 62.0 in | Wt 213.0 lb

## 2020-04-30 DIAGNOSIS — Z113 Encounter for screening for infections with a predominantly sexual mode of transmission: Secondary | ICD-10-CM | POA: Insufficient documentation

## 2020-04-30 DIAGNOSIS — N898 Other specified noninflammatory disorders of vagina: Secondary | ICD-10-CM | POA: Insufficient documentation

## 2020-04-30 NOTE — Progress Notes (Signed)
°  Subjective:     Patient ID: [name], female   DOB: February 21, 1961, 59 y.o.   MRN: [id number]  HPI [name] is a 59 year old black female, single PM in complaining of vaginal odor Sunday, had sex Saturday. PCP is Dr Legrand Rams.   Review of Systems Noticed odor Sunday, had sex Saturday Denies any itching or burning Did douche Monday  Reviewed past medical,surgical, social and family history. Reviewed medications and allergies.     Objective:   Physical Exam BP 132/85 (BP Location: Left Arm, Patient Position: Sitting, Cuff Size: Normal)    Pulse 94    Ht 5\' 2"  (1.575 m)    Wt 213 lb (96.6 kg)    LMP 01/15/2012    BMI 38.96 kg/m  Skin warm and dry.Pelvic: external genitalia is normal in appearance no lesions, vagina: scant white discharge without odor,urethra has no lesions or masses noted, Cervix is smooth. CV swab obtained Examination chaperoned by Diona Fanti CMA  [place] - 04/30/20 1120      Pregnancy Intention Screening   Does the patient want to become pregnant in the next year? No    Does the patient's partner want to become pregnant in the next year? No    Would the patient like to discuss contraceptive options today? No      Contraception Wrap Up   Current Method Female Sterilization    End Method Female Sterilization    Contraception Counseling Provided No             Assessment:     1. Vaginal odor CV swab sent  2. Screening examination for STD (sexually transmitted disease) CV swab sent    Plan:     Follow up prn

## 2020-05-01 ENCOUNTER — Telehealth: Payer: Self-pay | Admitting: Adult Health

## 2020-05-01 LAB — CERVICOVAGINAL ANCILLARY ONLY
Bacterial Vaginitis (gardnerella): NEGATIVE
Candida Glabrata: NEGATIVE
Candida Vaginitis: POSITIVE — AB
Chlamydia: NEGATIVE
Comment: NEGATIVE
Comment: NEGATIVE
Comment: NEGATIVE
Comment: NEGATIVE
Comment: NEGATIVE
Comment: NORMAL
Neisseria Gonorrhea: NEGATIVE
Trichomonas: NEGATIVE

## 2020-05-01 MED ORDER — FLUCONAZOLE 150 MG PO TABS: ORAL_TABLET | ORAL | 1 refills | Status: DC

## 2020-05-01 NOTE — Telephone Encounter (Signed)
Pt aware that CV swab was + yeast will rx diflucan

## 2020-05-14 DIAGNOSIS — I10 Essential (primary) hypertension: Secondary | ICD-10-CM | POA: Diagnosis not present

## 2020-05-14 DIAGNOSIS — E1142 Type 2 diabetes mellitus with diabetic polyneuropathy: Secondary | ICD-10-CM | POA: Diagnosis not present

## 2020-05-14 DIAGNOSIS — M21961 Unspecified acquired deformity of right lower leg: Secondary | ICD-10-CM | POA: Diagnosis not present

## 2020-06-03 DIAGNOSIS — Z7689 Persons encountering health services in other specified circumstances: Secondary | ICD-10-CM | POA: Diagnosis not present

## 2020-06-03 DIAGNOSIS — E119 Type 2 diabetes mellitus without complications: Secondary | ICD-10-CM | POA: Diagnosis not present

## 2020-06-03 DIAGNOSIS — H2513 Age-related nuclear cataract, bilateral: Secondary | ICD-10-CM | POA: Diagnosis not present

## 2020-06-03 DIAGNOSIS — H401131 Primary open-angle glaucoma, bilateral, mild stage: Secondary | ICD-10-CM | POA: Diagnosis not present

## 2020-06-13 DIAGNOSIS — I10 Essential (primary) hypertension: Secondary | ICD-10-CM | POA: Diagnosis not present

## 2020-06-13 DIAGNOSIS — E1165 Type 2 diabetes mellitus with hyperglycemia: Secondary | ICD-10-CM | POA: Diagnosis not present

## 2020-06-19 ENCOUNTER — Ambulatory Visit: Payer: Medicare Other | Admitting: Obstetrics and Gynecology

## 2020-06-25 ENCOUNTER — Other Ambulatory Visit (HOSPITAL_COMMUNITY)
Admission: RE | Admit: 2020-06-25 | Discharge: 2020-06-25 | Disposition: A | Discharge status: Home / Self Care | Payer: Medicare Other | Source: Ambulatory Visit | Attending: Obstetrics and Gynecology | Admitting: Obstetrics and Gynecology

## 2020-06-25 ENCOUNTER — Encounter: Payer: Self-pay | Admitting: Adult Health

## 2020-06-25 ENCOUNTER — Ambulatory Visit (INDEPENDENT_AMBULATORY_CARE_PROVIDER_SITE_OTHER): Payer: Medicare Other | Admitting: Adult Health

## 2020-06-25 ENCOUNTER — Other Ambulatory Visit: Payer: Self-pay

## 2020-06-25 VITALS — BP 115/72 | HR 92 | Ht 62.0 in | Wt 214.0 lb

## 2020-06-25 DIAGNOSIS — N898 Other specified noninflammatory disorders of vagina: Secondary | ICD-10-CM | POA: Diagnosis present

## 2020-06-25 NOTE — Progress Notes (Signed)
°  Subjective:     Patient ID: [name], female   DOB: 18-Jun-1961, 59 y.o.   MRN: [id number]  HPI Crystall is a 59 year old black female,single, PM in complaining of vaginal discharge with odor. PCP is Dr Legrand Rams   Review of Systems +vaginal discharge with odor Stomach cramps  Reviewed past medical,surgical, social and family history. Reviewed medications and allergies.     Objective:   Physical Exam BP 115/72 (BP Location: Left Arm, Patient Position: Sitting, Cuff Size: Normal)    Pulse 92    Ht 5\' 2"  (1.575 m)    Wt 214 lb (97.1 kg)    LMP 01/15/2012    BMI 39.14 kg/m  Skin warm and dry.Pelvic: external genitalia is normal in appearance no lesions, vagina: white discharge with odor,urethra has no lesions or masses noted, cervix:smooth and bulbous, uterus: normal size, shape and contour, non tender, no masses felt, adnexa: no masses or tenderness noted. Bladder is non tender and no masses felt. CV swab obtained Fall risk is low Examination chaperoned by Gardenia Phlegm RN    Assessment:     1. Vaginal odor CV swab sent   2. Vaginal discharge CV swab sent for GC/CHL,trich,BV and yeast  Will talk when results back     Plan:     Follow up prn

## 2020-06-26 ENCOUNTER — Telehealth: Payer: Self-pay | Admitting: *Deleted

## 2020-06-26 LAB — CERVICOVAGINAL ANCILLARY ONLY
Bacterial Vaginitis (gardnerella): NEGATIVE
Candida Glabrata: NEGATIVE
Candida Vaginitis: NEGATIVE
Chlamydia: NEGATIVE
Comment: NEGATIVE
Comment: NEGATIVE
Comment: NEGATIVE
Comment: NEGATIVE
Comment: NEGATIVE
Comment: NORMAL
Neisseria Gonorrhea: NEGATIVE
Trichomonas: NEGATIVE

## 2020-06-26 NOTE — Telephone Encounter (Signed)
-----   Message from Estill Dooms, NP sent at 06/26/2020  1:30 PM EDT ----- Let pt know vaginal swab all negative

## 2020-06-26 NOTE — Telephone Encounter (Signed)
No answer @ 1:48 pm. JSY

## 2020-06-26 NOTE — Telephone Encounter (Signed)
No answer @ 4:54 pm. JSY 

## 2020-06-28 NOTE — Telephone Encounter (Signed)
No answer @ 9:20 am. No other # available. Swab is negative. Closing encounter. [place]

## 2020-07-14 DIAGNOSIS — I10 Essential (primary) hypertension: Secondary | ICD-10-CM | POA: Diagnosis not present

## 2020-07-14 DIAGNOSIS — E1165 Type 2 diabetes mellitus with hyperglycemia: Secondary | ICD-10-CM | POA: Diagnosis not present

## 2020-08-06 ENCOUNTER — Encounter: Payer: Self-pay | Admitting: Orthopaedic Surgery

## 2020-08-06 ENCOUNTER — Ambulatory Visit: Payer: Medicare Other | Admitting: Orthopaedic Surgery

## 2020-08-13 DIAGNOSIS — E1165 Type 2 diabetes mellitus with hyperglycemia: Secondary | ICD-10-CM | POA: Diagnosis not present

## 2020-08-13 DIAGNOSIS — I10 Essential (primary) hypertension: Secondary | ICD-10-CM | POA: Diagnosis not present

## 2020-08-15 ENCOUNTER — Encounter: Payer: Self-pay | Admitting: Internal Medicine

## 2020-08-15 ENCOUNTER — Other Ambulatory Visit: Payer: Self-pay

## 2020-08-15 ENCOUNTER — Ambulatory Visit (INDEPENDENT_AMBULATORY_CARE_PROVIDER_SITE_OTHER): Payer: Medicare Other | Admitting: Internal Medicine

## 2020-08-15 VITALS — BP 128/74 | HR 101 | Temp 96.8°F | Ht 62.0 in | Wt 208.8 lb

## 2020-08-15 DIAGNOSIS — K219 Gastro-esophageal reflux disease without esophagitis: Secondary | ICD-10-CM | POA: Diagnosis not present

## 2020-08-15 DIAGNOSIS — K5900 Constipation, unspecified: Secondary | ICD-10-CM | POA: Insufficient documentation

## 2020-08-15 DIAGNOSIS — R109 Unspecified abdominal pain: Secondary | ICD-10-CM | POA: Insufficient documentation

## 2020-08-15 DIAGNOSIS — R1033 Periumbilical pain: Secondary | ICD-10-CM

## 2020-08-15 NOTE — Progress Notes (Signed)
Referring Provider: Rosita Fire, MD Primary Care Physician:  Rosita Fire, MD Primary GI:  Dr. Abbey Chatters  Chief Complaint  Patient presents with  . Abdominal Pain    Abd pain x 1 week, blood in stool, occasional constipation, a lot of gas with lingering odor, vomiting yesterday    HPI:   [name] is a 59 y.o. female who presents to the clinic today for acute visit.  Previously seen 2 months ago and was stable from a GI perspective.  Patient states approximately 1 week ago she began having periumbilical pain.  This occurred suddenly.  Has not worsened.  Feels a little bit better today but she still "sore."  Also notes episode of nausea and vomiting when this happened.  No recent changes in diet.  No picnics or cookouts.  No one else is sick at home.  No diarrhea.  Does note chronic constipation for which she takes Dulcolax.  States this is not adequate for her at this juncture.  Notes having 2 bowel movements on average a week.  Has also noted some rectal bleeding likely due to internal hemorrhoids.  Last colonoscopy 2019 with multiple polyps removed, all which were hyperplastic.  Past Medical History:  Diagnosis Date  . Arthritis   . Complication of anesthesia   . Diabetes mellitus ([place])    Type II  . DOE (dyspnea on exertion)   . Eczema   . GERD (gastroesophageal reflux disease)   . Glaucoma   . Hypercholesterolemia   . Hypertension   . Infection    r knee  . Muscle spasm of back   . Muscle spasms of both lower extremities   . PONV (postoperative nausea and vomiting)   . Positive ANA (antinuclear antibody)    1:180 homogenous pattern  . Trichimoniasis 10/04/2019   Treated 10/02/19 PCO______    Past Surgical History:  Procedure Laterality Date  . COLONOSCOPY WITH PROPOFOL N/A 06/14/2018   Five sessile polyps in rectum and sigmoid, 4-6 mm in size. Few small and large mouthed diverticula in recto-sigmoid, sigmoid colon, and descending colon. Internal hemorrhoids.  Hyperplastic polyps. Colonoscopy in 2024 as prep in right colon not ideal.   . IRRIGATION AND DEBRIDEMENT KNEE Right 05/12/2013   Procedure: IRRIGATION AND DEBRIDEMENT KNEE;  Surgeon: Carole Civil, MD;  Location: [facility];  Service: Orthopedics;  Laterality: Right;  . JOINT REPLACEMENT     r knee  . LIPOMA EXCISION  11/16/2011   Procedure: EXCISION LIPOMA;  Surgeon: Scherry Ran, MD;  Location: [facility];  Service: General;  Laterality: Left;  Excision of neoplasm left shoulder  . MOUTH SURGERY    . MULTIPLE EXTRACTIONS WITH ALVEOLOPLASTY  12/28/2011   Procedure: MULTIPLE EXTRACION WITH ALVEOLOPLASTY;  Surgeon: Gae Bon, DDS;  Location: [place];  Service: Oral Surgery;  Laterality: Bilateral;  . PATELLAR TENDON REPAIR Right 05/12/2013   Procedure: PATELLA TENDON REPAIR AND ALLOGRAFT RECONSTRUCTION;  Surgeon: Carole Civil, MD;  Location: [facility];  Service: Orthopedics;  Laterality: Right;  . POLYPECTOMY  06/14/2018   Procedure: POLYPECTOMY;  Surgeon: Danie Binder, MD;  [facility];  Service: Endoscopy;;  . resection arthroplasty right total knee  01/28/16   Dr Starlyn Skeans, [facility]; antibiotic spacer placed  . SHOULDER SURGERY Left   . TOOTH EXTRACTION N/A 12/08/2019   Procedure: DENTAL RESTORATION/EXTRACTIONS OF TEETH # SIX, ELEVEN, AND TWENTY-SEVEN;  Surgeon: Diona Browner, DDS;  Location: [place];  Service: Oral Surgery;  Laterality: N/A;  .  TOTAL KNEE ARTHROPLASTY Right 04/17/2013   Procedure: RIGHT TOTAL KNEE ARTHROPLASTY;  Surgeon: Carole Civil, MD;  Location: [facility];  Service: Orthopedics;  Laterality: Right;  . TOTAL KNEE ARTHROPLASTY Right 05/12/2013   Procedure: POLY EXCHANGE;  Surgeon: Carole Civil, MD;  Location: [facility];  Service: Orthopedics;  Laterality: Right;  . TUBAL LIGATION     and burned per patient.     Current Outpatient Medications  Medication Sig Dispense Refill  . ACCU-CHEK AVIVA PLUS test strip TEST TWICE DAILY ASADIRECTED.N    .  Accu-Chek Softclix Lancets lancets TEST TWICE DAILY ASTDIRECTED.    Marland Kitchen amLODipine (NORVASC) 5 MG tablet Take 5 mg by mouth daily.     Marland Kitchen aspirin 81 MG tablet Take 81 mg by mouth daily.     Marland Kitchen atorvastatin (LIPITOR) 20 MG tablet Take 20 mg by mouth daily.    . bisacodyl (DULCOLAX) 5 MG EC tablet Take 5 mg daily as needed by mouth for moderate constipation.    . cyclobenzaprine (FLEXERIL) 10 MG tablet Take 10 mg by mouth as needed for muscle spasms.     . diphenhydrAMINE (BENADRYL) 25 MG tablet Take 1 tablet (25 mg total) by mouth 3 (three) times daily. Take one tablet three times daily for two days (Patient taking differently: Take 25 mg by mouth as needed for itching (Rash).) 10 tablet 0  . Fluocinolone Acetonide Scalp 0.01 % OIL Apply 1 application topically at bedtime as needed (Eczema in hair).     . hydrochlorothiazide (HYDRODIURIL) 12.5 MG tablet Take 12.5 mg by mouth daily.    . IBU 800 MG tablet Take 800 mg by mouth daily as needed for mild pain or moderate pain.     Marland Kitchen linagliptin (TRADJENTA) 5 MG TABS tablet Take 5 mg by mouth daily.    . metFORMIN (GLUCOPHAGE) 500 MG tablet Take 1,000 mg by mouth 2 (two) times daily.     . pantoprazole (PROTONIX) 40 MG tablet TAKE 1 TABLET BY MOUTH 30 MINUTES BEFORE BREAKFAST DAILY. 90 tablet 3  . traMADol (ULTRAM) 50 MG tablet Take by mouth in the morning and at bedtime.    . travoprost, benzalkonium, (TRAVATAN) 0.004 % ophthalmic solution Place 1 drop into both eyes at bedtime.     . triamcinolone cream (KENALOG) 0.1 % Apply 1 application topically as needed (Eczema).     . zolpidem (AMBIEN) 10 MG tablet Take 1 tablet (10 mg total) by mouth at bedtime as needed for sleep. 30 tablet 0  . fluconazole (DIFLUCAN) 150 MG tablet Take 1 now and repeat 1 in 3 days (Patient not taking: No sig reported) 2 tablet 1   No current facility-administered medications for this visit.    Allergies as of 08/15/2020 - Review Complete 08/15/2020  Allergen Reaction Noted  .  Amoxicillin Swelling and Other (See Comments) 03/22/2018  . Chocolate Hives 11/13/2011  . Nystatin Anxiety 02/06/2016    Family History  Problem Relation Age of Onset  . Diabetes Brother   . Diabetes Mother   . Multiple sclerosis Sister   . Arthritis Neg Hx   . Anesthesia problems Neg Hx   . Hypotension Neg Hx   . Malignant hyperthermia Neg Hx   . Pseudochol deficiency Neg Hx   . Cancer Neg Hx   . Heart disease Neg Hx   . Stroke Neg Hx   . Colon cancer Neg Hx   . Colon polyps Neg Hx     Social History  Socioeconomic History  . Marital status: Single    Spouse name: Not on file  . Number of children: Not on file  . Years of education: 40  . Highest education level: Not on file  Occupational History    Employer: NOT EMPLOYED  Tobacco Use  . Smoking status: Current Some Day Smoker    Packs/day: 0.10    Years: 1.00    Pack years: 0.10    Types: Cigarettes    Last attempt to quit: 01/28/2016    Years since quitting: 4.5  . Smokeless tobacco: Never Used  . Tobacco comment: rare   Vaping Use  . Vaping Use: Never used  Substance and Sexual Activity  . Alcohol use: Yes    Alcohol/week: 2.0 standard drinks    Types: 2 Cans of beer per week    Comment: "once every blue moon"   . Drug use: No  . Sexual activity: Yes    Birth control/protection: Post-menopausal, Surgical    Comment: tubal  Other Topics Concern  . Not on file  Social History Narrative  . Not on file   Social Determinants of Health   Financial Resource Strain: Not on file  Food Insecurity: Not on file  Transportation Needs: Not on file  Physical Activity: Not on file  Stress: Not on file  Social Connections: Not on file    Subjective: Review of Systems  Constitutional: Negative for chills and fever.  HENT: Negative for congestion and hearing loss.   Eyes: Negative for blurred vision and double vision.  Respiratory: Negative for cough and shortness of breath.   Cardiovascular: Negative for  chest pain and palpitations.  Gastrointestinal: Positive for abdominal pain and constipation. Negative for blood in stool, heartburn, melena and vomiting.  Genitourinary: Negative for dysuria and urgency.  Musculoskeletal: Negative for joint pain and myalgias.  Skin: Negative for itching and rash.  Neurological: Negative for dizziness and headaches.  Psychiatric/Behavioral: Negative for depression. The patient is not nervous/anxious.      Objective: BP 128/74   Pulse (!) 101   Temp (!) 96.8 F (36 C) (Temporal)   Ht 5\' 2"  (1.575 m)   Wt 208 lb 12.8 oz (94.7 kg)   LMP 01/15/2012   BMI 38.19 kg/m  Physical Exam Constitutional:      Appearance: Normal appearance. She is obese.  HENT:     Head: Normocephalic and atraumatic.  Eyes:     Extraocular Movements: Extraocular movements intact.     Conjunctiva/sclera: Conjunctivae normal.  Cardiovascular:     Rate and Rhythm: Normal rate and regular rhythm.  Pulmonary:     Effort: Pulmonary effort is normal.     Breath sounds: Normal breath sounds.  Abdominal:     General: Bowel sounds are normal.     Palpations: Abdomen is soft.  Musculoskeletal:        General: No swelling. Normal range of motion.     Cervical back: Normal range of motion and neck supple.  Skin:    General: Skin is warm and dry.     Coloration: Skin is not jaundiced.  Neurological:     General: No focal deficit present.     Mental Status: She is alert and oriented to person, place, and time.  Psychiatric:        Mood and Affect: Mood normal.        Behavior: Behavior normal.      Assessment: *Abdominal pain-new, periumbilical *Chronic GERD-well-controlled on pantoprazole *Constipation-not well controlled  Plan: Patient's GERD is well controlled on pantoprazole will continue.  No alarm symptoms.  No breakthrough reflux.  No dysphagia/odynophagia.  In regards to patient's abdominal pain, etiology unclear.  Given the acute nature of this, question she  viral gastroenteritis.  Interestingly she is not having diarrhea however.  We will continue to monitor this for now.  If this does not improve over the next 6 to 8 weeks we will consider imaging such as CT scan.  Constipation not well controlled.  I have given her samples of Linzess 145 mcg daily.  Discussed that there might be an initial washout.  And she understands.  Patient to call office if this is helping and we will send in formal prescription.  Follow-up in 6 to 8 weeks or sooner if needed.  08/15/2020 12:45 PM   Disclaimer: This note was dictated with voice recognition software. Similar sounding words can inadvertently be transcribed and may not be corrected upon review.

## 2020-08-15 NOTE — Patient Instructions (Signed)
I have given you samples of Linzess 145 mcg daily to take for chronic constipation.  We have room to go up or down on this dosing depending on how well it works.  Please call and let us know and we will send in a formal prescription.  Do not take your other laxatives while on this medication.  Follow-up in 6 to 8 weeks.  If you are still having abdominal pain at that time, we will consider further work-up with CT imaging.  At [facility] we value your feedback. You may receive a survey about your visit today. Please share your experience as we strive to create trusting relationships with our patients to provide genuine, compassionate, quality care.  We appreciate your understanding and patience as we review any laboratory studies, imaging, and other diagnostic tests that are ordered as we care for you. Our office policy is 5 business days for review of these results, and any emergent or urgent results are addressed in a timely manner for your best interest. If you do not hear from our office in 1 week, please contact us.   We also encourage the use of MyChart, which contains your medical information for your review as well. If you are not enrolled in this feature, an access code is on this after visit summary for your convenience. Thank you for allowing [place] to be involved in your care.  It was great to see you today!  I hope you have a great rest of your winter!!    Elon Alas. Abbey Chatters, D.O. Gastroenterology and Hepatology [facility]

## 2020-08-28 ENCOUNTER — Telehealth: Payer: Self-pay | Admitting: Internal Medicine

## 2020-08-28 NOTE — Telephone Encounter (Signed)
Pt said the stool pills that Dr Marletta Lor gave her are working and she would like a prescription called into [place].

## 2020-08-28 NOTE — Telephone Encounter (Signed)
Dr. Marletta Lor  The pt states that the Linzess helped her a lot and would like a Rx called to [place].

## 2020-08-29 ENCOUNTER — Telehealth: Payer: Self-pay | Admitting: Internal Medicine

## 2020-08-29 MED ORDER — LINACLOTIDE 145 MCG PO CAPS: 145.0000 ug | ORAL_CAPSULE | Freq: Every day | ORAL | 5 refills | Status: DC

## 2020-08-29 NOTE — Telephone Encounter (Signed)
Phoned and advised the pt that a Rx was sent to her pharmacy.

## 2020-08-29 NOTE — Telephone Encounter (Signed)
Prescription sent to pharmacy, thanks!

## 2020-09-10 ENCOUNTER — Ambulatory Visit (INDEPENDENT_AMBULATORY_CARE_PROVIDER_SITE_OTHER): Payer: Medicare Other | Admitting: Orthopaedic Surgery

## 2020-09-10 ENCOUNTER — Encounter: Payer: Self-pay | Admitting: Orthopaedic Surgery

## 2020-09-10 ENCOUNTER — Other Ambulatory Visit: Payer: Self-pay

## 2020-09-10 DIAGNOSIS — G8929 Other chronic pain: Secondary | ICD-10-CM

## 2020-09-10 DIAGNOSIS — M25512 Pain in left shoulder: Secondary | ICD-10-CM

## 2020-09-10 DIAGNOSIS — M25511 Pain in right shoulder: Secondary | ICD-10-CM | POA: Diagnosis not present

## 2020-09-10 DIAGNOSIS — F17210 Nicotine dependence, cigarettes, uncomplicated: Secondary | ICD-10-CM

## 2020-09-10 NOTE — Patient Instructions (Signed)
Steps to Quit Smoking Smoking tobacco is the leading cause of preventable death. It can affect almost every organ in the body. Smoking puts you and people around you at risk for many serious, long-lasting (chronic) diseases. Quitting smoking can be hard, but it is one of the best things that you can do for your health. It is never too late to quit. How do I get ready to quit? When you decide to quit smoking, make a plan to help you succeed. Before you quit:  Pick a date to quit. Set a date within the next 2 weeks to give you time to prepare.  Write down the reasons why you are quitting. Keep this list in places where you will see it often.  Tell your family, friends, and co-workers that you are quitting. Their support is important.  Talk with your doctor about the choices that may help you quit.  Find out if your health insurance will pay for these treatments.  Know the people, places, things, and activities that make you want to smoke (triggers). Avoid them. What first steps can I take to quit smoking?  Throw away all cigarettes at home, at work, and in your car.  Throw away the things that you use when you smoke, such as ashtrays and lighters.  Clean your car. Make sure to empty the ashtray.  Clean your home, including curtains and carpets. What can I do to help me quit smoking? Talk with your doctor about taking medicines and seeing a counselor at the same time. You are more likely to succeed when you do both.  If you are pregnant or breastfeeding, talk with your doctor about counseling or other ways to quit smoking. Do not take medicine to help you quit smoking unless your doctor tells you to do so. To quit smoking: Quit right away  Quit smoking totally, instead of slowly cutting back on how much you smoke over a period of time.  Go to counseling. You are more likely to quit if you go to counseling sessions regularly. Take medicine You may take medicines to help you quit. Some  medicines need a prescription, and some you can buy over-the-counter. Some medicines may contain a drug called nicotine to replace the nicotine in cigarettes. Medicines may:  Help you to stop having the desire to smoke (cravings).  Help to stop the problems that come when you stop smoking (withdrawal symptoms). Your doctor may ask you to use:  Nicotine patches, gum, or lozenges.  Nicotine inhalers or sprays.  Non-nicotine medicine that is taken by mouth. Find resources Find resources and other ways to help you quit smoking and remain smoke-free after you quit. These resources are most helpful when you use them often. They include:  Online chats with a counselor.  Phone quitlines.  Printed self-help materials.  Support groups or group counseling.  Text messaging programs.  Mobile phone apps. Use apps on your mobile phone or tablet that can help you stick to your quit plan. There are many free apps for mobile phones and tablets as well as websites. Examples include Quit Guide from the CDC and smokefree.gov   What things can I do to make it easier to quit?  Talk to your family and friends. Ask them to support and encourage you.  Call a phone quitline (1-800-QUIT-NOW), reach out to support groups, or work with a counselor.  Ask people who smoke to not smoke around you.  Avoid places that make you want to smoke,   such as: ? Bars. ? Parties. ? Smoke-break areas at work.  Spend time with people who do not smoke.  Lower the stress in your life. Stress can make you want to smoke. Try these things to help your stress: ? Getting regular exercise. ? Doing deep-breathing exercises. ? Doing yoga. ? Meditating. ? Doing a body scan. To do this, close your eyes, focus on one area of your body at a time from head to toe. Notice which parts of your body are tense. Try to relax the muscles in those areas.   How will I feel when I quit smoking? Day 1 to 3 weeks Within the first 24 hours,  you may start to have some problems that come from quitting tobacco. These problems are very bad 2-3 days after you quit, but they do not often last for more than 2-3 weeks. You may get these symptoms:  Mood swings.  Feeling restless, nervous, angry, or annoyed.  Trouble concentrating.  Dizziness.  Strong desire for high-sugar foods and nicotine.  Weight gain.  Trouble pooping (constipation).  Feeling like you may vomit (nausea).  Coughing or a sore throat.  Changes in how the medicines that you take for other issues work in your body.  Depression.  Trouble sleeping (insomnia). Week 3 and afterward After the first 2-3 weeks of quitting, you may start to notice more positive results, such as:  Better sense of smell and taste.  Less coughing and sore throat.  Slower heart rate.  Lower blood pressure.  Clearer skin.  Better breathing.  Fewer sick days. Quitting smoking can be hard. Do not give up if you fail the first time. Some people need to try a few times before they succeed. Do your best to stick to your quit plan, and talk with your doctor if you have any questions or concerns. Summary  Smoking tobacco is the leading cause of preventable death. Quitting smoking can be hard, but it is one of the best things that you can do for your health.  When you decide to quit smoking, make a plan to help you succeed.  Quit smoking right away, not slowly over a period of time.  When you start quitting, seek help from your doctor, family, or friends. This information is not intended to replace advice given to you by your health care provider. Make sure you discuss any questions you have with your health care provider. Document Revised: 05/12/2019 Document Reviewed: 11/05/2018 Elsevier Patient Education  2021 Elsevier Inc.  

## 2020-09-10 NOTE — Progress Notes (Signed)
PROCEDURE NOTE:  The patient request injection, verbal consent was obtained.  The right shoulder was prepped appropriately after time out was performed.   Sterile technique was observed and injection of 1 cc of Depo-Medrol 40 mg with several cc's of plain xylocaine. Anesthesia was provided by ethyl chloride and a 20-gauge needle was used to inject the shoulder area. A posterior approach was used.  The injection was tolerated well.  A band aid dressing was applied.  The patient was advised to apply ice later today and tomorrow to the injection sight as needed.   PROCEDURE NOTE:  The patient request injection, verbal consent was obtained.  The left shoulder was prepped appropriately after time out was performed.   Sterile technique was observed and injection of 1 cc of Depo-Medrol 40 mg with several cc's of plain xylocaine. Anesthesia was provided by ethyl chloride and a 20-gauge needle was used to inject the shoulder area. A posterior approach was used.  The injection was tolerated well.  A band aid dressing was applied.  The patient was advised to apply ice later today and tomorrow to the injection sight as needed.  See as needed.  Call if any problem.  Precautions discussed.   Electronically Signed Sanjuana Kava, MD 1/11/20229:57 AM

## 2020-09-13 DIAGNOSIS — E1165 Type 2 diabetes mellitus with hyperglycemia: Secondary | ICD-10-CM | POA: Diagnosis not present

## 2020-09-13 DIAGNOSIS — I10 Essential (primary) hypertension: Secondary | ICD-10-CM | POA: Diagnosis not present

## 2020-10-10 ENCOUNTER — Encounter: Payer: Self-pay | Admitting: Internal Medicine

## 2020-10-10 ENCOUNTER — Other Ambulatory Visit: Payer: Self-pay

## 2020-10-10 ENCOUNTER — Ambulatory Visit (INDEPENDENT_AMBULATORY_CARE_PROVIDER_SITE_OTHER): Payer: Medicare Other | Admitting: Internal Medicine

## 2020-10-10 VITALS — BP 116/72 | HR 91 | Temp 97.3°F | Ht 62.0 in | Wt 208.0 lb

## 2020-10-10 DIAGNOSIS — K5900 Constipation, unspecified: Secondary | ICD-10-CM | POA: Diagnosis not present

## 2020-10-10 DIAGNOSIS — K648 Other hemorrhoids: Secondary | ICD-10-CM | POA: Diagnosis not present

## 2020-10-10 DIAGNOSIS — K219 Gastro-esophageal reflux disease without esophagitis: Secondary | ICD-10-CM

## 2020-10-10 DIAGNOSIS — K625 Hemorrhage of anus and rectum: Secondary | ICD-10-CM | POA: Diagnosis not present

## 2020-10-10 NOTE — Patient Instructions (Signed)
I am happy to hear you are doing better on the Linzess.  We will continue this medication.  I am going to set you up for an appointment with Roseanne Kaufman for hemorrhoid banding which is likely the cause of your bleeding.  At [facility] we value your feedback. You may receive a survey about your visit today. Please share your experience as we strive to create trusting relationships with our patients to provide genuine, compassionate, quality care.  We appreciate your understanding and patience as we review any laboratory studies, imaging, and other diagnostic tests that are ordered as we care for you. Our office policy is 5 business days for review of these results, and any emergent or urgent results are addressed in a timely manner for your best interest. If you do not hear from our office in 1 week, please contact us.   We also encourage the use of MyChart, which contains your medical information for your review as well. If you are not enrolled in this feature, an access code is on this after visit summary for your convenience. Thank you for allowing [place] to be involved in your care.  It was great to see you today!  I hope you have a great rest of your winter!!    [name]. Abbey Chatters, D.O. Gastroenterology and Hepatology [facility]

## 2020-10-10 NOTE — Progress Notes (Signed)
Referring Provider: Rosita Fire, MD Primary Care Physician:  Rosita Fire, MD Primary GI:  Dr. Abbey Chatters  Chief Complaint  Patient presents with  . Gastroesophageal Reflux    ok  . Constipation    ok    HPI:   [name] is a 60 y.o. female who presents to clinic today for follow-up visit.  Previously seen 08/15/2020 for constipation abdominal pain.  I started her on Linzess 145 mcg daily and she states this helped her symptoms immensely.  No longer having constipation.  Abdominal pain resolved.  Last colonoscopy 2019 with multiple polyps removed all of which were hyperplastic.  Her main complaint to me is ongoing rectal bleeding.  States every time she has a bowel movement she has bright red blood on the tissue paper.  No rectal pain or itching.  Colonoscopy did note internal hemorrhoids in 2019.  Past Medical History:  Diagnosis Date  . Arthritis   . Complication of anesthesia   . Diabetes mellitus ([place])    Type II  . DOE (dyspnea on exertion)   . Eczema   . GERD (gastroesophageal reflux disease)   . Glaucoma   . Hypercholesterolemia   . Hypertension   . Infection    r knee  . Muscle spasm of back   . Muscle spasms of both lower extremities   . PONV (postoperative nausea and vomiting)   . Positive ANA (antinuclear antibody)    1:180 homogenous pattern  . Trichimoniasis 10/04/2019   Treated 10/02/19 PCO______    Past Surgical History:  Procedure Laterality Date  . COLONOSCOPY WITH PROPOFOL N/A 06/14/2018   Five sessile polyps in rectum and sigmoid, 4-6 mm in size. Few small and large mouthed diverticula in recto-sigmoid, sigmoid colon, and descending colon. Internal hemorrhoids. Hyperplastic polyps. Colonoscopy in 2024 as prep in right colon not ideal.   . IRRIGATION AND DEBRIDEMENT KNEE Right 05/12/2013   Procedure: IRRIGATION AND DEBRIDEMENT KNEE;  Surgeon: Carole Civil, MD;  Location: [facility];  Service: Orthopedics;  Laterality: Right;  . JOINT  REPLACEMENT     r knee  . LIPOMA EXCISION  11/16/2011   Procedure: EXCISION LIPOMA;  Surgeon: Scherry Ran, MD;  Location: [facility];  Service: General;  Laterality: Left;  Excision of neoplasm left shoulder  . MOUTH SURGERY    . MULTIPLE EXTRACTIONS WITH ALVEOLOPLASTY  12/28/2011   Procedure: MULTIPLE EXTRACION WITH ALVEOLOPLASTY;  Surgeon: Gae Bon, DDS;  Location: [facility];  Service: Oral Surgery;  Laterality: Bilateral;  . PATELLAR TENDON REPAIR Right 05/12/2013   Procedure: PATELLA TENDON REPAIR AND ALLOGRAFT RECONSTRUCTION;  Surgeon: Carole Civil, MD;  Location: [facility];  Service: Orthopedics;  Laterality: Right;  . POLYPECTOMY  06/14/2018   Procedure: POLYPECTOMY;  Surgeon: Danie Binder, MD;  Location: [facility];  Service: Endoscopy;;  . resection arthroplasty right total knee  01/28/16   Dr Starlyn Skeans, [facility]; antibiotic spacer placed  . SHOULDER SURGERY Left   . TOOTH EXTRACTION N/A 12/08/2019   Procedure: DENTAL RESTORATION/EXTRACTIONS OF TEETH # SIX, ELEVEN, AND TWENTY-SEVEN;  Surgeon: Diona Browner, DDS;  Location: [facility];  Service: Oral Surgery;  Laterality: N/A;  . TOTAL KNEE ARTHROPLASTY Right 04/17/2013   Procedure: RIGHT TOTAL KNEE ARTHROPLASTY;  Surgeon: Carole Civil, MD;  Location: [facility];  Service: Orthopedics;  Laterality: Right;  . TOTAL KNEE ARTHROPLASTY Right 05/12/2013   Procedure: POLY EXCHANGE;  Surgeon: Carole Civil, MD;  Location: [facility];  Service: Orthopedics;  Laterality: Right;  . TUBAL LIGATION     and burned per patient.     Current Outpatient Medications  Medication Sig Dispense Refill  . ACCU-CHEK AVIVA PLUS test strip TEST TWICE DAILY ASADIRECTED.N    . Accu-Chek Softclix Lancets lancets TEST TWICE DAILY ASTDIRECTED.    Marland Kitchen amLODipine (NORVASC) 5 MG tablet Take 5 mg by mouth daily.     Marland Kitchen aspirin 81 MG tablet Take 81 mg by mouth daily.     Marland Kitchen atorvastatin (LIPITOR) 20 MG tablet Take 20 mg by mouth daily.    . bisacodyl (DULCOLAX)  5 MG EC tablet Take 5 mg daily as needed by mouth for moderate constipation.    . cyclobenzaprine (FLEXERIL) 10 MG tablet Take 10 mg by mouth as needed for muscle spasms.     . diphenhydrAMINE (BENADRYL) 25 MG tablet Take 1 tablet (25 mg total) by mouth 3 (three) times daily. Take one tablet three times daily for two days (Patient taking differently: Take 25 mg by mouth as needed for itching (Rash).) 10 tablet 0  . Fluocinolone Acetonide Scalp 0.01 % OIL Apply 1 application topically at bedtime as needed (Eczema in hair).     . gabapentin (NEURONTIN) 300 MG capsule Take 300 mg by mouth daily.    . hydrochlorothiazide (HYDRODIURIL) 12.5 MG tablet Take 12.5 mg by mouth daily.    . IBU 800 MG tablet Take 800 mg by mouth daily as needed for mild pain or moderate pain.     Marland Kitchen linaclotide (LINZESS) 145 MCG CAPS capsule Take 1 capsule (145 mcg total) by mouth daily before breakfast. 30 capsule 5  . linagliptin (TRADJENTA) 5 MG TABS tablet Take 5 mg by mouth daily.    . metFORMIN (GLUCOPHAGE) 500 MG tablet Take 1,000 mg by mouth 2 (two) times daily.     . pantoprazole (PROTONIX) 40 MG tablet TAKE 1 TABLET BY MOUTH 30 MINUTES BEFORE BREAKFAST DAILY. 90 tablet 3  . traMADol (ULTRAM) 50 MG tablet Take by mouth in the morning and at bedtime.    . travoprost, benzalkonium, (TRAVATAN) 0.004 % ophthalmic solution Place 1 drop into both eyes at bedtime.     . triamcinolone cream (KENALOG) 0.1 % Apply 1 application topically as needed (Eczema).     . zolpidem (AMBIEN) 10 MG tablet Take 1 tablet (10 mg total) by mouth at bedtime as needed for sleep. 30 tablet 0  . fluconazole (DIFLUCAN) 150 MG tablet Take 1 now and repeat 1 in 3 days (Patient not taking: Reported on 10/10/2020) 2 tablet 1   No current facility-administered medications for this visit.    Allergies as of 10/10/2020 - Review Complete 10/10/2020  Allergen Reaction Noted  . Amoxicillin Swelling and Other (See Comments) 03/22/2018  . Chocolate Hives  11/13/2011  . Nystatin Anxiety 02/06/2016    Family History  Problem Relation Age of Onset  . Diabetes Brother   . Diabetes Mother   . Multiple sclerosis Sister   . Arthritis Neg Hx   . Anesthesia problems Neg Hx   . Hypotension Neg Hx   . Malignant hyperthermia Neg Hx   . Pseudochol deficiency Neg Hx   . Cancer Neg Hx   . Heart disease Neg Hx   . Stroke Neg Hx   . Colon cancer Neg Hx   . Colon polyps Neg Hx     Social History   Socioeconomic History  . Marital status: Single    Spouse name: Not on file  .  Number of children: Not on file  . Years of education: 50  . Highest education level: Not on file  Occupational History    Employer: NOT EMPLOYED  Tobacco Use  . Smoking status: Current Some Day Smoker    Packs/day: 0.10    Years: 1.00    Pack years: 0.10    Types: Cigarettes    Last attempt to quit: 01/28/2016    Years since quitting: 4.7  . Smokeless tobacco: Never Used  . Tobacco comment: rare   Vaping Use  . Vaping Use: Never used  Substance and Sexual Activity  . Alcohol use: Yes    Alcohol/week: 2.0 standard drinks    Types: 2 Cans of beer per week    Comment: "once every blue moon"   . Drug use: No  . Sexual activity: Yes    Birth control/protection: Post-menopausal, Surgical    Comment: tubal  Other Topics Concern  . Not on file  Social History Narrative  . Not on file   Social Determinants of Health   Financial Resource Strain: Not on file  Food Insecurity: Not on file  Transportation Needs: Not on file  Physical Activity: Not on file  Stress: Not on file  Social Connections: Not on file    Subjective: Review of Systems  Constitutional: Negative for chills and fever.  HENT: Negative for congestion and hearing loss.   Eyes: Negative for blurred vision and double vision.  Respiratory: Negative for cough and shortness of breath.   Cardiovascular: Negative for chest pain and palpitations.  Gastrointestinal: Positive for blood in stool.  Negative for abdominal pain, constipation, diarrhea, heartburn, melena and vomiting.  Genitourinary: Negative for dysuria and urgency.  Musculoskeletal: Negative for joint pain and myalgias.  Skin: Negative for itching and rash.  Neurological: Negative for dizziness and headaches.  Psychiatric/Behavioral: Negative for depression. The patient is not nervous/anxious.      Objective: BP 116/72   Pulse 91   Temp (!) 97.3 F (36.3 C) (Temporal)   Ht 5\' 2"  (1.575 m)   Wt 208 lb (94.3 kg)   LMP 01/15/2012   BMI 38.04 kg/m  Physical Exam Constitutional:      Appearance: Normal appearance.  HENT:     Head: Normocephalic and atraumatic.  Eyes:     Extraocular Movements: Extraocular movements intact.     Conjunctiva/sclera: Conjunctivae normal.  Cardiovascular:     Rate and Rhythm: Normal rate and regular rhythm.  Pulmonary:     Effort: Pulmonary effort is normal.     Breath sounds: Normal breath sounds.  Abdominal:     General: Bowel sounds are normal.     Palpations: Abdomen is soft.  Musculoskeletal:        General: No swelling. Normal range of motion.     Cervical back: Normal range of motion and neck supple.  Skin:    General: Skin is warm and dry.     Coloration: Skin is not jaundiced.  Neurological:     General: No focal deficit present.     Mental Status: She is alert and oriented to person, place, and time.  Psychiatric:        Mood and Affect: Mood normal.        Behavior: Behavior normal.      Assessment: *Chronic reflux-well-controlled on pantoprazole *Constipation-vastly improved on Linzess  *Rectal bleeding-likely hemorrhoidal  Plan: Patient's chronic reflux well controlled on pantoprazole.  We will continue.  Constipation vastly improved on Linzess 145 mcg daily.  We will continue for now.  Abdominal pain resolved.  Patient is complaining of rectal bleeding with bowel movements.  Likely hemorrhoidal in nature.  Do not Feel we need to repeat colonoscopy  at this time as she had one 3 years ago.  Discussed hemorrhoid banding with patient and she is interested in having this done.  I will set her up for follow-up appointment with Vicente Males.  I have also given her information regarding hemorrhoid banding.  Otherwise she can follow-up with me in 1 year or sooner  10/10/2020 10:54 AM   Disclaimer: This note was dictated with voice recognition software. Similar sounding words can inadvertently be transcribed and may not be corrected upon review.

## 2020-10-14 DIAGNOSIS — I10 Essential (primary) hypertension: Secondary | ICD-10-CM | POA: Diagnosis not present

## 2020-10-14 DIAGNOSIS — E1165 Type 2 diabetes mellitus with hyperglycemia: Secondary | ICD-10-CM | POA: Diagnosis not present

## 2020-10-21 DIAGNOSIS — Z7689 Persons encountering health services in other specified circumstances: Secondary | ICD-10-CM | POA: Diagnosis not present

## 2020-10-21 DIAGNOSIS — H401131 Primary open-angle glaucoma, bilateral, mild stage: Secondary | ICD-10-CM | POA: Diagnosis not present

## 2020-11-06 DIAGNOSIS — Z96651 Presence of right artificial knee joint: Secondary | ICD-10-CM | POA: Diagnosis not present

## 2020-11-06 DIAGNOSIS — Z471 Aftercare following joint replacement surgery: Secondary | ICD-10-CM | POA: Diagnosis not present

## 2020-11-06 DIAGNOSIS — Z4789 Encounter for other orthopedic aftercare: Secondary | ICD-10-CM | POA: Diagnosis not present

## 2020-11-06 DIAGNOSIS — T84012A Broken internal right knee prosthesis, initial encounter: Secondary | ICD-10-CM | POA: Diagnosis not present

## 2020-11-06 DIAGNOSIS — Z981 Arthrodesis status: Secondary | ICD-10-CM | POA: Diagnosis not present

## 2020-11-06 DIAGNOSIS — Z7689 Persons encountering health services in other specified circumstances: Secondary | ICD-10-CM | POA: Diagnosis not present

## 2020-11-08 DIAGNOSIS — Z1389 Encounter for screening for other disorder: Secondary | ICD-10-CM | POA: Diagnosis not present

## 2020-11-08 DIAGNOSIS — E1165 Type 2 diabetes mellitus with hyperglycemia: Secondary | ICD-10-CM | POA: Diagnosis not present

## 2020-11-08 DIAGNOSIS — I10 Essential (primary) hypertension: Secondary | ICD-10-CM | POA: Diagnosis not present

## 2020-11-08 DIAGNOSIS — Z0001 Encounter for general adult medical examination with abnormal findings: Secondary | ICD-10-CM | POA: Diagnosis not present

## 2020-11-08 DIAGNOSIS — K219 Gastro-esophageal reflux disease without esophagitis: Secondary | ICD-10-CM | POA: Diagnosis not present

## 2020-11-11 ENCOUNTER — Other Ambulatory Visit (HOSPITAL_COMMUNITY)
Admission: RE | Admit: 2020-11-11 | Discharge: 2020-11-11 | Disposition: A | Discharge status: Home / Self Care | Payer: Medicare Other | Source: Ambulatory Visit | Attending: Adult Health | Admitting: Adult Health

## 2020-11-11 ENCOUNTER — Ambulatory Visit (INDEPENDENT_AMBULATORY_CARE_PROVIDER_SITE_OTHER): Payer: Medicare Other | Admitting: Adult Health

## 2020-11-11 ENCOUNTER — Encounter: Payer: Self-pay | Admitting: Adult Health

## 2020-11-11 ENCOUNTER — Other Ambulatory Visit: Payer: Self-pay

## 2020-11-11 VITALS — BP 143/81 | HR 93 | Ht 62.0 in | Wt 211.5 lb

## 2020-11-11 DIAGNOSIS — Z124 Encounter for screening for malignant neoplasm of cervix: Secondary | ICD-10-CM | POA: Insufficient documentation

## 2020-11-11 DIAGNOSIS — Z1211 Encounter for screening for malignant neoplasm of colon: Secondary | ICD-10-CM | POA: Insufficient documentation

## 2020-11-11 DIAGNOSIS — Z0000 Encounter for general adult medical examination without abnormal findings: Secondary | ICD-10-CM

## 2020-11-11 DIAGNOSIS — Z1231 Encounter for screening mammogram for malignant neoplasm of breast: Secondary | ICD-10-CM | POA: Insufficient documentation

## 2020-11-11 DIAGNOSIS — Z1151 Encounter for screening for human papillomavirus (HPV): Secondary | ICD-10-CM | POA: Insufficient documentation

## 2020-11-11 DIAGNOSIS — Z01419 Encounter for gynecological examination (general) (routine) without abnormal findings: Secondary | ICD-10-CM | POA: Insufficient documentation

## 2020-11-11 HISTORY — DX: Encounter for general adult medical examination without abnormal findings: Z00.00

## 2020-11-11 LAB — HEMOCCULT GUIAC POC 1CARD (OFFICE): Fecal Occult Blood, POC: NEGATIVE

## 2020-11-11 NOTE — Progress Notes (Signed)
Patient ID: [name], female   DOB: [birth year], 60 y.o.   MRN: [id number] History of Present Illness: [name] is a 60 year old black female,single, PM in for pelvic and pap, had physical with PCP last week and due to have labs. PCP is Dr Legrand Rams   Current Medications, Allergies, Past Medical History, Past Surgical History, Family History and Social History were reviewed in [facility] record.     Review of Systems:  Patient denies any headaches, hearing loss, fatigue, blurred vision, shortness of breath, chest pain, abdominal pain, problems with bowel movements, urination, or intercourse. No joint pain or mood swings. Denies any vaginal bleeding or discharge.   Physical Exam:BP (!) 143/81 (BP Location: Left Arm, Patient Position: Sitting, Cuff Size: Large)   Pulse 93   Ht 5\' 2"  (1.575 m)   Wt 211 lb 8 oz (95.9 kg)   LMP 01/15/2012   BMI 38.68 kg/m  General:  Well developed, well nourished, no acute distress,she uses a cane Skin:  Warm and dry Neck:  Midline trachea, normal thyroid, good ROM, no lymphadenopathy Lungs; Clear to auscultation bilaterally Breast:  No dominant palpable mass, retraction, or nipple discharge Cardiovascular: Regular rate and rhythm Abdomen:  Soft, non tender, no hepatosplenomegaly Pelvic:  External genitalia is normal in appearance, no lesions.  The vagina is normal in appearance. Urethra has no lesions or masses. The cervix is smooth, pap with GC?CHL and HRHPV genotyping performed.  Uterus is felt to be normal size, shape, and contour.  No adnexal masses or tenderness noted.Bladder is non tender, no masses felt. Rectal: Sphincter tone is tight, no polyps, + hemorrhoids felt.  Hemoccult negative. Extremities/musculoskeletal:  No swelling or varicosities noted, no clubbing or cyanosis Psych:  No mood changes, alert and cooperative,seems happy AA is 5 Fall risk is moderate PHQ 9 score is 0 GAD 7 score is 0  [place] - 11/11/20  1430      Pregnancy Intention Screening   Does the patient want to become pregnant in the next year? No    Does the patient's partner want to become pregnant in the next year? No    Would the patient like to discuss contraceptive options today? No      Contraception Wrap Up   Current Method Female Sterilization    End Method Female Sterilization    Contraception Counseling Provided No         Examination chaperoned by Levy Pupa LPN  Impression and Plan: 1. Routine medical exam Physical and labs with PCP Colonoscopy per GI   2. Routine cervical smear Pap sent  3. Encounter for screening fecal occult blood testing She is having hemorrhoids banded soon  4. Screening mammogram for breast cancer Mammogram scheduled for her 3/15 at 11 am at [facility]. Encounter for gynecological examination with Papanicolaou smear of cervix Pap sent Pap in 3 years if normal

## 2020-11-14 LAB — CYTOLOGY - PAP
Adequacy: ABSENT
Chlamydia: NEGATIVE
Comment: NEGATIVE
Comment: NEGATIVE
Comment: NORMAL
Diagnosis: NEGATIVE
High risk HPV: NEGATIVE
Neisseria Gonorrhea: NEGATIVE

## 2020-11-21 ENCOUNTER — Encounter: Payer: Medicare Other | Admitting: Gastroenterology

## 2020-11-22 ENCOUNTER — Ambulatory Visit (HOSPITAL_COMMUNITY)
Admission: RE | Admit: 2020-11-22 | Discharge: 2020-11-22 | Disposition: A | Discharge status: Home / Self Care | Payer: Medicare Other | Source: Ambulatory Visit | Attending: Adult Health | Admitting: Adult Health

## 2020-11-22 ENCOUNTER — Other Ambulatory Visit: Payer: Self-pay

## 2020-11-22 DIAGNOSIS — I10 Essential (primary) hypertension: Secondary | ICD-10-CM | POA: Diagnosis not present

## 2020-11-22 DIAGNOSIS — Z0001 Encounter for general adult medical examination with abnormal findings: Secondary | ICD-10-CM | POA: Diagnosis not present

## 2020-11-22 DIAGNOSIS — Z1231 Encounter for screening mammogram for malignant neoplasm of breast: Secondary | ICD-10-CM | POA: Diagnosis not present

## 2020-11-22 DIAGNOSIS — E1165 Type 2 diabetes mellitus with hyperglycemia: Secondary | ICD-10-CM | POA: Diagnosis not present

## 2020-11-22 DIAGNOSIS — Z79899 Other long term (current) drug therapy: Secondary | ICD-10-CM | POA: Diagnosis not present

## 2020-11-28 ENCOUNTER — Ambulatory Visit: Payer: Medicare Other | Admitting: Orthopaedic Surgery

## 2020-12-09 DIAGNOSIS — M1990 Unspecified osteoarthritis, unspecified site: Secondary | ICD-10-CM | POA: Diagnosis not present

## 2020-12-09 DIAGNOSIS — E1165 Type 2 diabetes mellitus with hyperglycemia: Secondary | ICD-10-CM | POA: Diagnosis not present

## 2021-01-08 DIAGNOSIS — I10 Essential (primary) hypertension: Secondary | ICD-10-CM | POA: Diagnosis not present

## 2021-01-08 DIAGNOSIS — E1165 Type 2 diabetes mellitus with hyperglycemia: Secondary | ICD-10-CM | POA: Diagnosis not present

## 2021-01-20 DIAGNOSIS — L304 Erythema intertrigo: Secondary | ICD-10-CM | POA: Diagnosis not present

## 2021-01-20 DIAGNOSIS — L308 Other specified dermatitis: Secondary | ICD-10-CM | POA: Diagnosis not present

## 2021-01-21 ENCOUNTER — Encounter: Payer: Self-pay | Admitting: Orthopaedic Surgery

## 2021-01-21 ENCOUNTER — Other Ambulatory Visit: Payer: Self-pay

## 2021-01-21 ENCOUNTER — Ambulatory Visit (INDEPENDENT_AMBULATORY_CARE_PROVIDER_SITE_OTHER): Payer: Medicare Other | Admitting: Orthopaedic Surgery

## 2021-01-21 DIAGNOSIS — G8929 Other chronic pain: Secondary | ICD-10-CM

## 2021-01-21 DIAGNOSIS — M25512 Pain in left shoulder: Secondary | ICD-10-CM | POA: Diagnosis not present

## 2021-01-21 DIAGNOSIS — M25511 Pain in right shoulder: Secondary | ICD-10-CM

## 2021-01-21 NOTE — Progress Notes (Signed)
PROCEDURE NOTE:  The patient request injection, verbal consent was obtained.  The right shoulder was prepped appropriately after time out was performed.   Sterile technique was observed and injection of 1 cc of Celestone 6 mg with several cc's of plain xylocaine. Anesthesia was provided by ethyl chloride and a 20-gauge needle was used to inject the shoulder area. A posterior approach was used.  The injection was tolerated well.  A band aid dressing was applied.  The patient was advised to apply ice later today and tomorrow to the injection sight as needed.  PROCEDURE NOTE:  The patient request injection, verbal consent was obtained.  The left shoulder was prepped appropriately after time out was performed.   Sterile technique was observed and injection of 1 cc of Celestone 6 mg with several cc's of plain xylocaine. Anesthesia was provided by ethyl chloride and a 20-gauge needle was used to inject the shoulder area. A posterior approach was used.  The injection was tolerated well.  A band aid dressing was applied.  The patient was advised to apply ice later today and tomorrow to the injection sight as needed.  See as needed.  Call if any problem.  Precautions discussed.   Electronically Signed Sanjuana Kava, MD 5/24/20229:06 AM

## 2021-02-08 DIAGNOSIS — I10 Essential (primary) hypertension: Secondary | ICD-10-CM | POA: Diagnosis not present

## 2021-02-08 DIAGNOSIS — E1165 Type 2 diabetes mellitus with hyperglycemia: Secondary | ICD-10-CM | POA: Diagnosis not present

## 2021-02-24 DIAGNOSIS — Z7689 Persons encountering health services in other specified circumstances: Secondary | ICD-10-CM | POA: Diagnosis not present

## 2021-02-24 DIAGNOSIS — H401131 Primary open-angle glaucoma, bilateral, mild stage: Secondary | ICD-10-CM | POA: Diagnosis not present

## 2021-03-10 DIAGNOSIS — I10 Essential (primary) hypertension: Secondary | ICD-10-CM | POA: Diagnosis not present

## 2021-03-10 DIAGNOSIS — E1165 Type 2 diabetes mellitus with hyperglycemia: Secondary | ICD-10-CM | POA: Diagnosis not present

## 2021-04-04 ENCOUNTER — Ambulatory Visit
Admission: EM | Admit: 2021-04-04 | Discharge: 2021-04-04 | Disposition: A | Discharge status: Home / Self Care | Payer: Medicare Other | Attending: Emergency Medicine | Admitting: Emergency Medicine

## 2021-04-04 ENCOUNTER — Encounter: Payer: Self-pay | Admitting: Emergency Medicine

## 2021-04-04 DIAGNOSIS — Z1152 Encounter for screening for COVID-19: Secondary | ICD-10-CM | POA: Diagnosis not present

## 2021-04-04 DIAGNOSIS — R062 Wheezing: Secondary | ICD-10-CM

## 2021-04-04 DIAGNOSIS — J069 Acute upper respiratory infection, unspecified: Secondary | ICD-10-CM

## 2021-04-04 MED ORDER — BENZONATATE 100 MG PO CAPS: 100.0000 mg | ORAL_CAPSULE | Freq: Three times a day (TID) | ORAL | 0 refills | Status: DC

## 2021-04-04 MED ORDER — PREDNISONE 10 MG (21) PO TBPK: ORAL_TABLET | Freq: Every day | ORAL | 0 refills | Status: DC

## 2021-04-04 NOTE — ED Triage Notes (Addendum)
Fatigue, cough and congestion  since last Friday.  Tried to get in with pcp but states she needs a covid test first.

## 2021-04-04 NOTE — Discharge Instructions (Addendum)

## 2021-04-04 NOTE — ED Provider Notes (Signed)
[place]   [id number] 04/04/21 Arrival Time: 0840   CC: COVID symptoms  SUBJECTIVE: History from: patient.  [name] is a 60 y.o. female who presents with fatigue, cough, and congestion x 1 week.  Friend with covid.   Denies alleviating or aggravating.  Denies previous symptoms in the past.   Denies fever, sore throat, SOB, wheezing, chest pain, nausea, changes in bowel or bladder habits.     ROS: As per HPI.  All other pertinent ROS negative.     Past Medical History:  Diagnosis Date   Arthritis    Complication of anesthesia    Diabetes mellitus ([place])    Type II   DOE (dyspnea on exertion)    Eczema    GERD (gastroesophageal reflux disease)    Glaucoma    Hypercholesterolemia    Hypertension    Infection    r knee   Muscle spasm of back    Muscle spasms of both lower extremities    PONV (postoperative nausea and vomiting)    Positive ANA (antinuclear antibody)    1:180 homogenous pattern   Trichimoniasis 10/04/2019   Treated 10/02/19 PCO______   Past Surgical History:  Procedure Laterality Date   COLONOSCOPY WITH PROPOFOL N/A 06/14/2018   Five sessile polyps in rectum and sigmoid, 4-6 mm in size. Few small and large mouthed diverticula in recto-sigmoid, sigmoid colon, and descending colon. Internal hemorrhoids. Hyperplastic polyps. Colonoscopy in 2024 as prep in right colon not ideal.    IRRIGATION AND DEBRIDEMENT KNEE Right 05/12/2013   Procedure: IRRIGATION AND DEBRIDEMENT KNEE;  Surgeon: Carole Civil, MD;  Location: [facility];  Service: Orthopedics;  Laterality: Right;   JOINT REPLACEMENT     r knee   LIPOMA EXCISION  11/16/2011   Procedure: EXCISION LIPOMA;  Surgeon: Scherry Ran, MD;  Location: [facility];  Service: General;  Laterality: Left;  Excision of neoplasm left shoulder   MOUTH SURGERY     MULTIPLE EXTRACTIONS WITH ALVEOLOPLASTY  12/28/2011   Procedure: MULTIPLE EXTRACION WITH ALVEOLOPLASTY;  Surgeon: Gae Bon, DDS;   Location: [facility];  Service: Oral Surgery;  Laterality: Bilateral;   PATELLAR TENDON REPAIR Right 05/12/2013   Procedure: PATELLA TENDON REPAIR AND ALLOGRAFT RECONSTRUCTION;  Surgeon: Carole Civil, MD;  Location: [facility];  Service: Orthopedics;  Laterality: Right;   POLYPECTOMY  06/14/2018   Procedure: POLYPECTOMY;  Surgeon: Danie Binder, MD;  Location: [facility];  Service: Endoscopy;;   resection arthroplasty right total knee  01/28/16   Dr Starlyn Skeans, [facility]; antibiotic spacer placed   SHOULDER SURGERY Left    TOOTH EXTRACTION N/A 12/08/2019   Procedure: DENTAL RESTORATION/EXTRACTIONS OF TEETH # SIX, ELEVEN, AND TWENTY-SEVEN;  Surgeon: Diona Browner, DDS;  Location: [place];  Service: Oral Surgery;  Laterality: N/A;   TOTAL KNEE ARTHROPLASTY Right 04/17/2013   Procedure: RIGHT TOTAL KNEE ARTHROPLASTY;  Surgeon: Carole Civil, MD;  Location: [facility];  Service: Orthopedics;  Laterality: Right;   TOTAL KNEE ARTHROPLASTY Right 05/12/2013   Procedure: POLY EXCHANGE;  Surgeon: Carole Civil, MD;  Location: [facility];  Service: Orthopedics;  Laterality: Right;   TUBAL LIGATION     and burned per patient.    Allergies  Allergen Reactions   Amoxicillin Swelling and Other (See Comments)    Shaking, stomach upset, felt like face was swelling   Chocolate Hives   Nystatin Anxiety   No current facility-administered medications on file prior to encounter.   Current  Outpatient Medications on File Prior to Encounter  Medication Sig Dispense Refill   ACCU-CHEK AVIVA PLUS test strip TEST TWICE DAILY ASADIRECTED.N     Accu-Chek Softclix Lancets lancets TEST TWICE DAILY ASTDIRECTED.     amLODipine (NORVASC) 5 MG tablet Take 5 mg by mouth daily.      aspirin 81 MG tablet Take 81 mg by mouth daily.      atorvastatin (LIPITOR) 20 MG tablet Take 20 mg by mouth daily.     bisacodyl (DULCOLAX) 5 MG EC tablet Take 5 mg daily as needed by mouth for moderate constipation.     cyclobenzaprine  (FLEXERIL) 10 MG tablet Take 10 mg by mouth as needed for muscle spasms.      diphenhydrAMINE (BENADRYL) 25 MG tablet Take 1 tablet (25 mg total) by mouth 3 (three) times daily. Take one tablet three times daily for two days (Patient taking differently: Take 25 mg by mouth as needed for itching (Rash).) 10 tablet 0   Fluocinolone Acetonide Scalp 0.01 % OIL Apply 1 application topically at bedtime as needed (Eczema in hair).      gabapentin (NEURONTIN) 300 MG capsule Take 300 mg by mouth daily.     hydrochlorothiazide (HYDRODIURIL) 12.5 MG tablet Take 12.5 mg by mouth daily.     IBU 800 MG tablet Take 800 mg by mouth daily as needed for mild pain or moderate pain.      ketoconazole (NIZORAL) 2 % cream Apply 1 application topically daily.     linaclotide (LINZESS) 145 MCG CAPS capsule Take 1 capsule (145 mcg total) by mouth daily before breakfast. 30 capsule 5   linagliptin (TRADJENTA) 5 MG TABS tablet Take 5 mg by mouth daily.     metFORMIN (GLUCOPHAGE) 500 MG tablet Take 1,000 mg by mouth 2 (two) times daily.      pantoprazole (PROTONIX) 40 MG tablet TAKE 1 TABLET BY MOUTH 30 MINUTES BEFORE BREAKFAST DAILY. 90 tablet 3   traMADol (ULTRAM) 50 MG tablet Take by mouth in the morning and at bedtime.     travoprost, benzalkonium, (TRAVATAN) 0.004 % ophthalmic solution Place 1 drop into both eyes at bedtime.      zolpidem (AMBIEN) 10 MG tablet Take 1 tablet (10 mg total) by mouth at bedtime as needed for sleep. 30 tablet 0   Social History   Socioeconomic History   Marital status: Single    Spouse name: Not on file   Number of children: Not on file   Years of education: 12   Highest education level: Not on file  Occupational History    Employer: NOT EMPLOYED  Tobacco Use   Smoking status: Some Days    Packs/day: 0.10    Years: 1.00    Pack years: 0.10    Types: Cigarettes    Last attempt to quit: 01/28/2016    Years since quitting: 5.1   Smokeless tobacco: Never   Tobacco comments:     rare   Vaping Use   Vaping Use: Never used  Substance and Sexual Activity   Alcohol use: Yes    Alcohol/week: 2.0 standard drinks    Types: 2 Cans of beer per week    Comment: "once every blue moon"    Drug use: No   Sexual activity: Yes    Birth control/protection: Post-menopausal, Surgical    Comment: tubal  Other Topics Concern   Not on file  Social History Narrative   Not on file   Social Determinants of Health  Financial Resource Strain: Low Risk    Difficulty of Paying Living Expenses: Not hard at all  Food Insecurity: No Food Insecurity   Worried About Charity fundraiser in the Last Year: Never true   Ran Out of Food in the Last Year: Never true  Transportation Needs: No Transportation Needs   Lack of Transportation (Medical): No   Lack of Transportation (Non-Medical): No  Physical Activity: Inactive   Days of Exercise per Week: 0 days   Minutes of Exercise per Session: 0 min  Stress: No Stress Concern Present   Feeling of Stress : Not at all  Social Connections: Socially Isolated   Frequency of Communication with Friends and Family: Never   Frequency of Social Gatherings with Friends and Family: Once a week   Attends Religious Services: Never   Marine scientist or Organizations: No   Attends Music therapist: Never   Marital Status: Never married  Human resources officer Violence: Not At Risk   Fear of Current or Ex-Partner: No   Emotionally Abused: No   Physically Abused: No   Sexually Abused: No   Family History  Problem Relation Age of Onset   Diabetes Brother    Diabetes Mother    Multiple sclerosis Sister    Arthritis Neg Hx    Anesthesia problems Neg Hx    Hypotension Neg Hx    Malignant hyperthermia Neg Hx    Pseudochol deficiency Neg Hx    Cancer Neg Hx    Heart disease Neg Hx    Stroke Neg Hx    Colon cancer Neg Hx    Colon polyps Neg Hx     OBJECTIVE:  Vitals:   04/04/21 0922  BP: 120/75  Pulse: 94  Resp: 16  Temp:  98 F (36.7 C)  TempSrc: Tympanic  SpO2: 92%     General appearance: alert; appears mildly fatigued, but nontoxic; speaking in full sentences and tolerating own secretions HEENT: NCAT; Ears: EACs clear, TMs pearly gray; Eyes: PERRL.  EOM grossly intact. Nose: nares patent without rhinorrhea, Throat: oropharynx clear, tonsils non erythematous or enlarged, uvula midline  Neck: supple without LAD Lungs: unlabored respirations, symmetrical air entry; cough: absent; no respiratory distress; subtle wheezes throughout bilateral lungs Heart: regular rate and rhythm.   Skin: warm and dry Neuro: ambulates with a cane Psychological: alert and cooperative; normal mood and affect  ASSESSMENT & PLAN:  1. Encounter for screening for COVID-19   2. Wheezes   3. Viral URI with cough     Meds ordered this encounter  Medications   benzonatate (TESSALON) 100 MG capsule    Sig: Take 1 capsule (100 mg total) by mouth every 8 (eight) hours.    Dispense:  21 capsule    Refill:  0    Order Specific Question:   Supervising Provider    Answer:   Raylene Everts [id number]   predniSONE (STERAPRED UNI-PAK 21 TAB) 10 MG (21) TBPK tablet    Sig: Take by mouth daily. Take 6 tabs by mouth daily  for 2 days, then 5 tabs for 2 days, then 4 tabs for 2 days, then 3 tabs for 2 days, 2 tabs for 2 days, then 1 tab by mouth daily for 2 days    Dispense:  42 tablet    Refill:  0    Order Specific Question:   Supervising Provider    Answer:   Raylene Everts [id number]    COVID testing ordered.  It will take between 5-7 days for test results.  Someone will contact you regarding abnormal results.    In the meantime: You should remain isolated in your home for 5 days from symptom onset AND greater than 72 hours after symptoms resolution (absence of fever without the use of fever-reducing medication and improvement in respiratory symptoms), whichever is longer Get plenty of rest and push fluids Tessalon Perles  prescribed for cough Prednisone prescribed Use OTC zyrtec for nasal congestion, runny nose, and/or sore throat Use OTC flonase for nasal congestion and runny nose Use medications daily for symptom relief Use OTC medications like ibuprofen or tylenol as needed fever or pain Call or go to the ED if you have any new or worsening symptoms such as fever, worsening cough, shortness of breath, chest tightness, chest pain, turning blue, changes in mental status, etc...   PCP nurse sent here for covid test  Reviewed expectations re: course of current medical issues. Questions answered. Outlined signs and symptoms indicating need for more acute intervention. Patient verbalized understanding. After Visit Summary given.          Lestine Box, PA-C 04/04/21 [phone]

## 2021-04-07 LAB — NOVEL CORONAVIRUS, NAA: SARS-CoV-2, NAA: DETECTED — AB

## 2021-04-10 DIAGNOSIS — I10 Essential (primary) hypertension: Secondary | ICD-10-CM | POA: Diagnosis not present

## 2021-04-10 DIAGNOSIS — E1165 Type 2 diabetes mellitus with hyperglycemia: Secondary | ICD-10-CM | POA: Diagnosis not present

## 2021-04-15 DIAGNOSIS — I10 Essential (primary) hypertension: Secondary | ICD-10-CM | POA: Diagnosis not present

## 2021-04-15 DIAGNOSIS — U099 Post covid-19 condition, unspecified: Secondary | ICD-10-CM | POA: Diagnosis not present

## 2021-04-15 DIAGNOSIS — E1165 Type 2 diabetes mellitus with hyperglycemia: Secondary | ICD-10-CM | POA: Diagnosis not present

## 2021-04-28 ENCOUNTER — Other Ambulatory Visit: Payer: Self-pay | Admitting: Gastroenterology

## 2021-05-13 ENCOUNTER — Other Ambulatory Visit: Payer: Medicare Other

## 2021-05-14 ENCOUNTER — Other Ambulatory Visit: Payer: Medicare Other

## 2021-05-15 ENCOUNTER — Ambulatory Visit (INDEPENDENT_AMBULATORY_CARE_PROVIDER_SITE_OTHER): Payer: Medicare Other | Admitting: Orthopaedic Surgery

## 2021-05-15 ENCOUNTER — Other Ambulatory Visit: Payer: Self-pay

## 2021-05-15 ENCOUNTER — Encounter: Payer: Self-pay | Admitting: Orthopaedic Surgery

## 2021-05-15 VITALS — Ht 62.0 in | Wt 206.0 lb

## 2021-05-15 DIAGNOSIS — M25511 Pain in right shoulder: Secondary | ICD-10-CM | POA: Diagnosis not present

## 2021-05-15 DIAGNOSIS — G8929 Other chronic pain: Secondary | ICD-10-CM | POA: Diagnosis not present

## 2021-05-15 DIAGNOSIS — M25512 Pain in left shoulder: Secondary | ICD-10-CM | POA: Diagnosis not present

## 2021-05-15 NOTE — Progress Notes (Signed)
PROCEDURE NOTE:  The patient request injection, verbal consent was obtained.  The right shoulder was prepped appropriately after time out was performed.   Sterile technique was observed and injection of 1 cc of Celestone 6 mg with several cc's of plain xylocaine. Anesthesia was provided by ethyl chloride and a 20-gauge needle was used to inject the shoulder area. A posterior approach was used.  The injection was tolerated well.  A band aid dressing was applied.  The patient was advised to apply ice later today and tomorrow to the injection sight as needed.   PROCEDURE NOTE:  The patient request injection, verbal consent was obtained.  The left shoulder was prepped appropriately after time out was performed.   Sterile technique was observed and injection of 1 cc of Celestone 6 mg with several cc's of plain xylocaine. Anesthesia was provided by ethyl chloride and a 20-gauge needle was used to inject the shoulder area. A posterior approach was used.  The injection was tolerated well.  A band aid dressing was applied.  The patient was advised to apply ice later today and tomorrow to the injection sight as needed.  See as needed.  Call if any problem.  Precautions discussed.  Electronically Signed Sanjuana Kava, MD 9/15/20228:55 AM

## 2021-05-20 ENCOUNTER — Encounter (HOSPITAL_COMMUNITY): Payer: Self-pay

## 2021-05-20 ENCOUNTER — Other Ambulatory Visit: Payer: Self-pay

## 2021-05-20 ENCOUNTER — Emergency Department (HOSPITAL_COMMUNITY): Payer: Medicare Other

## 2021-05-20 ENCOUNTER — Emergency Department (HOSPITAL_COMMUNITY)
Admission: EM | Admit: 2021-05-20 | Discharge: 2021-05-20 | Disposition: A | Discharge status: Home / Self Care | Payer: Medicare Other | Attending: Emergency Medicine | Admitting: Emergency Medicine

## 2021-05-20 DIAGNOSIS — R519 Headache, unspecified: Secondary | ICD-10-CM | POA: Diagnosis not present

## 2021-05-20 DIAGNOSIS — E119 Type 2 diabetes mellitus without complications: Secondary | ICD-10-CM | POA: Insufficient documentation

## 2021-05-20 DIAGNOSIS — I10 Essential (primary) hypertension: Secondary | ICD-10-CM | POA: Diagnosis not present

## 2021-05-20 DIAGNOSIS — J069 Acute upper respiratory infection, unspecified: Secondary | ICD-10-CM | POA: Insufficient documentation

## 2021-05-20 DIAGNOSIS — F17210 Nicotine dependence, cigarettes, uncomplicated: Secondary | ICD-10-CM | POA: Insufficient documentation

## 2021-05-20 DIAGNOSIS — B9789 Other viral agents as the cause of diseases classified elsewhere: Secondary | ICD-10-CM | POA: Diagnosis not present

## 2021-05-20 DIAGNOSIS — Z20822 Contact with and (suspected) exposure to covid-19: Secondary | ICD-10-CM | POA: Diagnosis not present

## 2021-05-20 DIAGNOSIS — Z7982 Long term (current) use of aspirin: Secondary | ICD-10-CM | POA: Insufficient documentation

## 2021-05-20 DIAGNOSIS — Z79899 Other long term (current) drug therapy: Secondary | ICD-10-CM | POA: Insufficient documentation

## 2021-05-20 DIAGNOSIS — Z96651 Presence of right artificial knee joint: Secondary | ICD-10-CM | POA: Diagnosis not present

## 2021-05-20 DIAGNOSIS — Z7984 Long term (current) use of oral hypoglycemic drugs: Secondary | ICD-10-CM | POA: Insufficient documentation

## 2021-05-20 LAB — COMPREHENSIVE METABOLIC PANEL
ALT: 37 U/L (ref 0–44)
AST: 29 U/L (ref 15–41)
Albumin: 4.4 g/dL (ref 3.5–5.0)
Alkaline Phosphatase: 120 U/L (ref 38–126)
Anion gap: 9 (ref 5–15)
BUN: 28 mg/dL — ABNORMAL HIGH (ref 6–20)
CO2: 25 mmol/L (ref 22–32)
Calcium: 9.6 mg/dL (ref 8.9–10.3)
Chloride: 101 mmol/L (ref 98–111)
Creatinine, Ser: 1.01 mg/dL — ABNORMAL HIGH (ref 0.44–1.00)
GFR, Estimated: >60 mL/min (ref >60)
Glucose, Bld: 119 mg/dL — ABNORMAL HIGH (ref 70–99)
Potassium: 3.3 mmol/L — ABNORMAL LOW (ref 3.5–5.1)
Sodium: 135 mmol/L (ref 135–145)
Total Bilirubin: 0.5 mg/dL (ref 0.3–1.2)
Total Protein: 8.6 g/dL — ABNORMAL HIGH (ref 6.5–8.1)

## 2021-05-20 LAB — CBC WITH DIFFERENTIAL/PLATELET
Abs Immature Granulocytes: 0.04 10*3/uL (ref 0.00–0.07)
Basophils Absolute: 0.1 10*3/uL (ref 0.0–0.1)
Basophils Relative: 1 %
Eosinophils Absolute: 0.2 10*3/uL (ref 0.0–0.5)
Eosinophils Relative: 1 %
HCT: 45.1 % (ref 36.0–46.0)
Hemoglobin: 14.4 g/dL (ref 12.0–15.0)
Immature Granulocytes: 0 %
Lymphocytes Relative: 26 %
Lymphs Abs: 3.2 10*3/uL (ref 0.7–4.0)
MCH: 27.1 pg (ref 26.0–34.0)
MCHC: 31.9 g/dL (ref 30.0–36.0)
MCV: 84.9 fL (ref 80.0–100.0)
Monocytes Absolute: 0.9 10*3/uL (ref 0.1–1.0)
Monocytes Relative: 8 %
Neutro Abs: 7.7 10*3/uL (ref 1.7–7.7)
Neutrophils Relative %: 64 %
Platelets: 366 10*3/uL (ref 150–400)
RBC: 5.31 MIL/uL — ABNORMAL HIGH (ref 3.87–5.11)
RDW: 17 % — ABNORMAL HIGH (ref 11.5–15.5)
WBC: 12.1 10*3/uL — ABNORMAL HIGH (ref 4.0–10.5)
nRBC: 0 % (ref 0.0–0.2)

## 2021-05-20 LAB — RESP PANEL BY RT-PCR (FLU A&B, COVID) ARPGX2
Influenza A by PCR: NEGATIVE
Influenza B by PCR: NEGATIVE
SARS Coronavirus 2 by RT PCR: NEGATIVE

## 2021-05-20 LAB — LIPASE, BLOOD: Lipase: 39 U/L (ref 11–51)

## 2021-05-20 MED ORDER — METOCLOPRAMIDE HCL 5 MG/ML IJ SOLN
10.0000 mg | Freq: Once | INTRAMUSCULAR | Status: AC
  Administered 2021-05-20: 10 mg via INTRAVENOUS
  Filled 2021-05-20: qty 2

## 2021-05-20 MED ORDER — SODIUM CHLORIDE 0.9 % IV BOLUS
500.0000 mL | Freq: Once | INTRAVENOUS | Status: AC
  Administered 2021-05-20: 500 mL via INTRAVENOUS

## 2021-05-20 MED ORDER — ONDANSETRON HCL 4 MG/2ML IJ SOLN
4.0000 mg | Freq: Once | INTRAMUSCULAR | Status: AC
Start: 2021-05-20 — End: 2021-05-20
  Administered 2021-05-20: 4 mg via INTRAVENOUS
  Filled 2021-05-20: qty 2

## 2021-05-20 NOTE — ED Provider Notes (Signed)
[facility] EMERGENCY DEPARTMENT Provider Note   CSN: [id number] Arrival date & time: 05/20/21  6283     History Chief Complaint  Patient presents with   Cough    [name] is a 60 y.o. female.  HPI  Patient with significant medical history of diabetes type 2, GERD, hypertension, presents to the emergency department with chief complaint of headaches and concerns of possible COVID.  Patient states on Saturday around 12 she developed a headache, she states headache remains on the left side of her head, describes it as a throbbing-like sensation, she has no associated change in vision, paresthesias or weakness in the upper or lower extremities, she denies photophobia or increase in activity to noise, she denies recent head trauma, is not on anticoagulant.  She states that she then  develop some nausea and vomiting has been unable to tolerate p.o. she denies stomach pain constipation, diarrhea she denies hematemesis or coffee-ground emesis.  She  notes that she is having general body aches and a nonproductive cough.  She endorses that she is vaccinated against COVID-19, has had her booster shots, she does not note that her grandkids were sick but is unsure if it was COVID.  States that she has been taking Tylenol which seemed to help with her headache, states today her headache has slightly improved, she has no other complaints at this time.  Does not endorse fevers, chills, chest pain, shortness of breath, abdominal pain, worsening pedal edema.  Past Medical History:  Diagnosis Date   Arthritis    Complication of anesthesia    Diabetes mellitus ([place])    Type II   DOE (dyspnea on exertion)    Eczema    GERD (gastroesophageal reflux disease)    Glaucoma    Hypercholesterolemia    Hypertension    Infection    r knee   Muscle spasm of back    Muscle spasms of both lower extremities    PONV (postoperative nausea and vomiting)    Positive ANA (antinuclear antibody)    1:180  homogenous pattern   Trichimoniasis 10/04/2019   Treated 10/02/19 PCO______    Patient Active Problem List   Diagnosis Date Noted   Encounter for gynecological examination with Papanicolaou smear of cervix 11/11/2020   Screening mammogram for breast cancer 11/11/2020   Encounter for screening fecal occult blood testing 11/11/2020   Routine cervical smear 11/11/2020   Routine medical exam 11/11/2020   Internal hemorrhoids 10/10/2020   Rectal bleeding 10/10/2020   Abdominal pain 08/15/2020   Constipation 08/15/2020   Screening examination for STD (sexually transmitted disease) 04/30/2020   History of trichomoniasis 10/13/2019   Trichimoniasis 10/04/2019   Breast pain, right 10/02/2019   Mass of upper outer quadrant of right breast 10/02/2019   BV (bacterial vaginosis) 10/02/2019   Vaginal odor 10/02/2019   Vaginal discharge 10/02/2019   GERD (gastroesophageal reflux disease) 01/21/2018   Encounter for screening colonoscopy 01/21/2018   Trichomonas vaginitis 06/25/2016   Allergic urticaria 03/04/2016   Rash and nonspecific skin eruption 03/03/2016   Positive ANA (antinuclear antibody) 02/27/2016   Eczema 02/27/2016   Dysmenorrhea 02/19/2016   Tobacco use  02/06/2016   DM2 (diabetes mellitus, type 2) ([place]) 02/06/2016   Infected orthopedic implant ([place]) 11/23/2013   History of knee replacement procedure of right knee 10/10/2013   Rupture patellar tendon 10/10/2013   Postoperative infection of knee ([place]) 10/10/2013   Arthritis of knee, right 03/30/2013   Shoulder mass 08/13/2011  Past Surgical History:  Procedure Laterality Date   COLONOSCOPY WITH PROPOFOL N/A 06/14/2018   Five sessile polyps in rectum and sigmoid, 4-6 mm in size. Few small and large mouthed diverticula in recto-sigmoid, sigmoid colon, and descending colon. Internal hemorrhoids. Hyperplastic polyps. Colonoscopy in 2024 as prep in right colon not ideal.    IRRIGATION AND DEBRIDEMENT KNEE Right 05/12/2013    Procedure: IRRIGATION AND DEBRIDEMENT KNEE;  Surgeon: Carole Civil, MD;  Location: [facility];  Service: Orthopedics;  Laterality: Right;   JOINT REPLACEMENT     r knee   LIPOMA EXCISION  11/16/2011   Procedure: EXCISION LIPOMA;  Surgeon: Scherry Ran, MD;  Location: [facility];  Service: General;  Laterality: Left;  Excision of neoplasm left shoulder   MOUTH SURGERY     MULTIPLE EXTRACTIONS WITH ALVEOLOPLASTY  12/28/2011   Procedure: MULTIPLE EXTRACION WITH ALVEOLOPLASTY;  Surgeon: Gae Bon, DDS;  Location: [place];  Service: Oral Surgery;  Laterality: Bilateral;   PATELLAR TENDON REPAIR Right 05/12/2013   Procedure: PATELLA TENDON REPAIR AND ALLOGRAFT RECONSTRUCTION;  Surgeon: Carole Civil, MD;  Location: [facility];  Service: Orthopedics;  Laterality: Right;   POLYPECTOMY  06/14/2018   Procedure: POLYPECTOMY;  Surgeon: Danie Binder, MD;  [facility];  Service: Endoscopy;;   resection arthroplasty right total knee  01/28/16   Dr Starlyn Skeans, [facility]; antibiotic spacer placed   SHOULDER SURGERY Left    TOOTH EXTRACTION N/A 12/08/2019   Procedure: DENTAL RESTORATION/EXTRACTIONS OF TEETH # SIX, ELEVEN, AND TWENTY-SEVEN;  Surgeon: Diona Browner, DDS;  Location: [place];  Service: Oral Surgery;  Laterality: N/A;   TOTAL KNEE ARTHROPLASTY Right 04/17/2013   Procedure: RIGHT TOTAL KNEE ARTHROPLASTY;  Surgeon: Carole Civil, MD;  Location: [facility];  Service: Orthopedics;  Laterality: Right;   TOTAL KNEE ARTHROPLASTY Right 05/12/2013   Procedure: POLY EXCHANGE;  Surgeon: Carole Civil, MD;  Location: [facility];  Service: Orthopedics;  Laterality: Right;   TUBAL LIGATION     and burned per patient.      OB History     Gravida  5   Para  5   Term  5   Preterm      AB      Living  6      SAB      IAB      Ectopic      Multiple  1   Live Births              Family History  Problem Relation Age of Onset   Diabetes Brother    Diabetes Mother     Multiple sclerosis Sister    Arthritis Neg Hx    Anesthesia problems Neg Hx    Hypotension Neg Hx    Malignant hyperthermia Neg Hx    Pseudochol deficiency Neg Hx    Cancer Neg Hx    Heart disease Neg Hx    Stroke Neg Hx    Colon cancer Neg Hx    Colon polyps Neg Hx     Social History   Tobacco Use   Smoking status: Some Days    Packs/day: 0.10    Years: 1.00    Pack years: 0.10    Types: Cigarettes    Last attempt to quit: 01/28/2016    Years since quitting: 5.3   Smokeless tobacco: Never   Tobacco comments:    rare   Vaping Use   Vaping Use: Never used  Substance Use Topics   Alcohol use: Yes    Alcohol/week: 2.0 standard drinks    Types: 2 Cans of beer per week    Comment: "once every blue moon"    Drug use: No    Home Medications Prior to Admission medications   Medication Sig Start Date End Date Taking? Authorizing Provider  amLODipine (NORVASC) 5 MG tablet Take 5 mg by mouth daily.  09/12/18  Yes [provider]  aspirin 81 MG tablet Take 81 mg by mouth daily.    Yes [provider]  atorvastatin (LIPITOR) 20 MG tablet Take 20 mg by mouth daily.   Yes [provider]  bisacodyl (DULCOLAX) 5 MG EC tablet Take 5 mg daily as needed by mouth for moderate constipation.   Yes [provider]  cyclobenzaprine (FLEXERIL) 10 MG tablet Take 10 mg by mouth as needed for muscle spasms.    Yes [provider]  diphenhydrAMINE (BENADRYL) 25 MG tablet Take 1 tablet (25 mg total) by mouth 3 (three) times daily. Take one tablet three times daily for two days Patient taking differently: Take 25 mg by mouth as needed for itching (Rash). 06/14/18  Yes Carmin Muskrat, MD  Fluocinolone Acetonide Scalp 0.01 % OIL Apply 1 application topically at bedtime as needed (Eczema in hair).  11/16/19  Yes [provider]  gabapentin (NEURONTIN) 300 MG capsule Take 300 mg by mouth daily. 08/14/20  Yes [provider]   hydrochlorothiazide (HYDRODIURIL) 12.5 MG tablet Take 12.5 mg by mouth daily.   Yes [provider]  IBU 800 MG tablet Take 800 mg by mouth daily as needed for mild pain or moderate pain.  05/28/17  Yes [provider]  linagliptin (TRADJENTA) 5 MG TABS tablet Take 5 mg by mouth daily.   Yes [provider]  metFORMIN (GLUCOPHAGE) 500 MG tablet Take 1,000 mg by mouth 2 (two) times daily.    Yes [provider]  pantoprazole (PROTONIX) 40 MG tablet TAKE 1 TABLET BY MOUTH 30 MINUTES BEFORE BREAKFAST DAILY. 04/29/21  Yes Annitta Needs, NP  traMADol (ULTRAM) 50 MG tablet Take by mouth in the morning and at bedtime.   Yes [provider]  travoprost, benzalkonium, (TRAVATAN) 0.004 % ophthalmic solution Place 1 drop into both eyes at bedtime.    Yes [provider]  zolpidem (AMBIEN) 10 MG tablet Take 1 tablet (10 mg total) by mouth at bedtime as needed for sleep. 05/25/16  Yes Reed, Tiffany L, DO  benzonatate (TESSALON) 100 MG capsule Take 1 capsule (100 mg total) by mouth every 8 (eight) hours. Patient not taking: No sig reported 04/04/21   Wurst, [place], PA-C  ketoconazole (NIZORAL) 2 % cream Apply 1 application topically daily. Patient not taking: No sig reported    [provider]  linaclotide (LINZESS) 145 MCG CAPS capsule Take 1 capsule (145 mcg total) by mouth daily before breakfast. 08/29/20 02/25/21  Eloise Harman, DO  predniSONE (STERAPRED UNI-PAK 21 TAB) 10 MG (21) TBPK tablet Take by mouth daily. Take 6 tabs by mouth daily  for 2 days, then 5 tabs for 2 days, then 4 tabs for 2 days, then 3 tabs for 2 days, 2 tabs for 2 days, then 1 tab by mouth daily for 2 days Patient not taking: No sig reported 04/04/21   Stacey Drain, [place], PA-C    Allergies    Amoxicillin, Chocolate, and Nystatin  Review of Systems   Review of Systems  Constitutional:  Negative for chills and fever.  HENT:  Negative for congestion and sore throat.    Respiratory:  Positive for cough. Negative for shortness of breath.   Cardiovascular:  Negative for chest pain.  Gastrointestinal:  Positive for nausea and vomiting. Negative for abdominal pain, constipation and diarrhea.  Genitourinary:  Negative for enuresis.  Musculoskeletal:  Positive for myalgias. Negative for back pain.  Skin:  Negative for rash.  Neurological:  Positive for headaches. Negative for dizziness and weakness.  Hematological:  Does not bruise/bleed easily.   Physical Exam Updated Vital Signs BP 133/87 (BP Location: Left Arm)   Pulse 88   Temp 98.3 F (36.8 C) (Oral)   Resp 18   Ht 5\' 2"  (1.575 m)   Wt 93.8 kg   LMP 01/15/2012   SpO2 100%   BMI 37.82 kg/m   Physical Exam Vitals and nursing note reviewed.  Constitutional:      General: She is not in acute distress.    Appearance: She is not ill-appearing.  HENT:     Head: Normocephalic and atraumatic.     Nose: No congestion or rhinorrhea.     Mouth/Throat:     Mouth: Mucous membranes are moist.     Pharynx: Oropharynx is clear. No oropharyngeal exudate or posterior oropharyngeal erythema.  Eyes:     Extraocular Movements: Extraocular movements intact.     Conjunctiva/sclera: Conjunctivae normal.     Pupils: Pupils are equal, round, and reactive to light.  Cardiovascular:     Rate and Rhythm: Normal rate and regular rhythm.     Pulses: Normal pulses.     Heart sounds: No murmur heard.   No friction rub. No gallop.  Pulmonary:     Effort: No respiratory distress.     Breath sounds: No wheezing, rhonchi or rales.  Abdominal:     Palpations: Abdomen is soft.     Tenderness: There is no abdominal tenderness. There is no right CVA tenderness or left CVA tenderness.  Musculoskeletal:     Cervical back: No rigidity.     Comments: Patient has 5 of 5 strength, full range of motion the upper and lower extremities, able to ambulate without difficulty.  Skin:    General: Skin is warm and dry.  Neurological:      Mental Status: She is alert.     GCS: GCS eye subscore is 4. GCS verbal subscore is 5. GCS motor subscore is 6.     Cranial Nerves: No facial asymmetry.     Sensory: Sensation is intact.     Motor: No weakness.     Coordination: Romberg sign negative. Finger-Nose-Finger Test normal.     Gait: Gait normal.     Comments: Cranial nerves II through XII are grossly intact, no difficulty word finding, no slurring of her words, able to follow two-step commands, no unilateral weakness present, no gait disturbances.  Psychiatric:        Mood and Affect: Mood normal.    ED Results / Procedures / Treatments   Labs (all labs ordered are listed, but only abnormal results are displayed) Labs Reviewed  COMPREHENSIVE METABOLIC PANEL - Abnormal; Notable for the following components:      Result Value   Potassium 3.3 (*)    Glucose, Bld 119 (*)    BUN 28 (*)    Creatinine, Ser 1.01 (*)    Total Protein 8.6 (*)    All other components within normal limits  CBC WITH DIFFERENTIAL/PLATELET - Abnormal;  Notable for the following components:   WBC 12.1 (*)    RBC 5.31 (*)    RDW 17.0 (*)    All other components within normal limits  RESP PANEL BY RT-PCR (FLU A&B, COVID) ARPGX2  LIPASE, BLOOD    EKG None  Radiology No results found.  Procedures Procedures   Medications Ordered in ED Medications  metoCLOPramide (REGLAN) injection 10 mg (10 mg Intravenous Given 05/20/21 0958)  sodium chloride 0.9 % bolus 500 mL (0 mLs Intravenous Stopped 05/20/21 1155)  ondansetron (ZOFRAN) injection 4 mg (4 mg Intravenous Given 05/20/21 0957)    ED Course  I have reviewed the triage vital signs and the nursing notes.  Pertinent labs & imaging results that were available during my care of the patient were reviewed by me and considered in my medical decision making (see chart for details).    MDM Rules/Calculators/A&P                          Initial impression-patient presents with headaches and  concerns of COVID, she is alert, does not appear in acute distress, vital signs reassuring.  Likely her symptoms are secondary to viral infection but due to the duration of headache and the fact that she never had a headache like this in the past will obtain CT head basic lab work-up and reassess.  Work-up-CBC shows leukocytosis, CMP shows slight hypokalemia of 3.3, hyperglycemia of 119, BUN of 28, creatinine 1.01, lipase is 39, respiratory panel negative.  Reassessment-notified by nursing staff that patient has refused CT head.  Spoke with the patient states that she does not like to be on any machines.  She states that her headache has completely resolved since the migraine cocktail.  We will continue to monitor.  Patient was updated on lab work and imaging, she has no complaints this time, vital signs have remained stable.  Patient is agreeable for discharge.  Rule out-I have low suspicion for intracranial head bleed and/or CVA as no focal deficits present my exam, patient denies recent head trauma, is not on anticoagulant.  I recommend CT head but patient refused this, likely this was secondary due to viral URI, headache did resolve after migraine cocktail.  I have low suspicion for dissection or aneurysm of the vertebral/carotid arteries as presentation is atypical of etiology.  Low suspicion for meningitis as there is no meningeal sign present, patient has low risk factors. Low suspicion for systemic infection as patient is nontoxic-appearing, vital signs reassuring, no obvious source infection noted on exam.  Patient does have elevated white count but this appears to be her baseline likely from the chronic steroid use.  Low suspicion for pneumonia as lung sounds are clear bilaterally, will defer imaging as lung sounds were clear bilaterally.    I have low suspicion for PE as patient denies pleuritic chest pain, shortness of breath, no pedal edema, presentation atypical of etiology.  Low suspicion for  strep throat as oropharynx was visualized, no erythema or exudates noted.  Low suspicion patient would need  hospitalized due to viral infection or Covid as vital signs reassuring, patient is not in respiratory distress.    Plan-  Patient has a headache and URI-like symptoms-I suspect you have some from a viral URI, will recommend over-the-counter pain medications, hydration, follow-up PCP for further evaluation.  Vital signs have remained stable, no indication for hospital admission. Patient given at home care as well strict return precautions.  Patient  verbalized that they understood agreed to said plan.  Final Clinical Impression(s) / ED Diagnoses Final diagnoses:  Viral URI with cough    Rx / DC Orders ED Discharge Orders     None        Marcello Fennel, PA-C 05/20/21 1200    Daleen Bo, MD 05/20/21 2013

## 2021-05-20 NOTE — Discharge Instructions (Signed)
You have been seen here for URI like symptoms.  I recommend taking Tylenol for fever control and ibuprofen for pain control please follow dosing on the back of bottle.  I recommend staying hydrated and if you do not an appetite, I recommend soups as this will provide you with fluids and calories.  Your COVID test is negative.  Please follow-up with your PCP for further evaluation.  Come back to the emergency department if you develop chest pain, shortness of breath, severe abdominal pain, uncontrolled nausea, vomiting, diarrhea.

## 2021-05-20 NOTE — ED Triage Notes (Signed)
Pt to er, pt states that she is here for a headache, states that it started Saturday night, states that it lasted into Monday.  States that she is concerned that she "might have a little taste of the covid" states that she also has chills and cough

## 2021-05-30 DIAGNOSIS — I10 Essential (primary) hypertension: Secondary | ICD-10-CM | POA: Diagnosis not present

## 2021-05-30 DIAGNOSIS — E1122 Type 2 diabetes mellitus with diabetic chronic kidney disease: Secondary | ICD-10-CM | POA: Diagnosis not present

## 2021-05-30 DIAGNOSIS — K219 Gastro-esophageal reflux disease without esophagitis: Secondary | ICD-10-CM | POA: Diagnosis not present

## 2021-05-30 DIAGNOSIS — M21961 Unspecified acquired deformity of right lower leg: Secondary | ICD-10-CM | POA: Diagnosis not present

## 2021-05-30 DIAGNOSIS — Z23 Encounter for immunization: Secondary | ICD-10-CM | POA: Diagnosis not present

## 2021-06-26 DIAGNOSIS — Z7689 Persons encountering health services in other specified circumstances: Secondary | ICD-10-CM | POA: Diagnosis not present

## 2021-06-26 DIAGNOSIS — Z7984 Long term (current) use of oral hypoglycemic drugs: Secondary | ICD-10-CM | POA: Diagnosis not present

## 2021-06-26 DIAGNOSIS — H25813 Combined forms of age-related cataract, bilateral: Secondary | ICD-10-CM | POA: Diagnosis not present

## 2021-06-26 DIAGNOSIS — H401131 Primary open-angle glaucoma, bilateral, mild stage: Secondary | ICD-10-CM | POA: Diagnosis not present

## 2021-06-26 DIAGNOSIS — E1136 Type 2 diabetes mellitus with diabetic cataract: Secondary | ICD-10-CM | POA: Diagnosis not present

## 2021-06-29 DIAGNOSIS — E1122 Type 2 diabetes mellitus with diabetic chronic kidney disease: Secondary | ICD-10-CM | POA: Diagnosis not present

## 2021-06-29 DIAGNOSIS — I10 Essential (primary) hypertension: Secondary | ICD-10-CM | POA: Diagnosis not present

## 2021-07-01 ENCOUNTER — Other Ambulatory Visit: Payer: Medicare Other

## 2021-07-04 ENCOUNTER — Other Ambulatory Visit: Payer: Medicare Other

## 2021-07-07 DIAGNOSIS — L818 Other specified disorders of pigmentation: Secondary | ICD-10-CM | POA: Diagnosis not present

## 2021-07-07 DIAGNOSIS — L308 Other specified dermatitis: Secondary | ICD-10-CM | POA: Diagnosis not present

## 2021-07-08 ENCOUNTER — Other Ambulatory Visit (HOSPITAL_COMMUNITY)
Admission: RE | Admit: 2021-07-08 | Discharge: 2021-07-08 | Disposition: A | Discharge status: Home / Self Care | Payer: Medicare Other | Source: Ambulatory Visit | Attending: Obstetrics & Gynecology | Admitting: Obstetrics & Gynecology

## 2021-07-08 ENCOUNTER — Other Ambulatory Visit (INDEPENDENT_AMBULATORY_CARE_PROVIDER_SITE_OTHER): Payer: Medicare Other | Admitting: *Deleted

## 2021-07-08 ENCOUNTER — Other Ambulatory Visit: Payer: Self-pay

## 2021-07-08 DIAGNOSIS — N898 Other specified noninflammatory disorders of vagina: Secondary | ICD-10-CM | POA: Diagnosis present

## 2021-07-08 NOTE — Progress Notes (Signed)
   NURSE VISIT- VAGINITIS/STD/POC  SUBJECTIVE:  [name] is a 60 y.o. [id number] GYN patientfemale here for a vaginal swab for vaginitis screening, STD screen.  She reports the following symptoms: vulvar itching for a few days. Denies abnormal vaginal bleeding, significant pelvic pain, fever, or UTI symptoms.  OBJECTIVE:  LMP 01/15/2012   Appears well, in no apparent distress  ASSESSMENT: Vaginal swab for vaginitis screening  PLAN: Self-collected vaginal probe for Gonorrhea, Chlamydia, Trichomonas, Bacterial Vaginosis, Yeast sent to lab Treatment: to be determined once results are received Follow-up as needed if symptoms persist/worsen, or new symptoms develop  Janece Canterbury  07/08/2021 10:11 AM

## 2021-07-08 NOTE — Progress Notes (Signed)
Chart reviewed for nurse visit. Agree with plan of care.  Estill Dooms, NP 07/08/2021 10:51 AM

## 2021-07-09 LAB — CERVICOVAGINAL ANCILLARY ONLY
Bacterial Vaginitis (gardnerella): NEGATIVE
Candida Glabrata: NEGATIVE
Candida Vaginitis: POSITIVE — AB
Chlamydia: NEGATIVE
Comment: NEGATIVE
Comment: NEGATIVE
Comment: NEGATIVE
Comment: NEGATIVE
Comment: NEGATIVE
Comment: NORMAL
Neisseria Gonorrhea: NEGATIVE
Trichomonas: NEGATIVE

## 2021-07-10 ENCOUNTER — Telehealth: Payer: Self-pay | Admitting: Adult Health

## 2021-07-10 MED ORDER — FLUCONAZOLE 150 MG PO TABS: ORAL_TABLET | ORAL | 1 refills | Status: DC

## 2021-07-10 NOTE — Telephone Encounter (Signed)
+  yeast on vaginal swab rx sent in for diflucan, no answer when called

## 2021-07-15 ENCOUNTER — Ambulatory Visit: Payer: Medicare Other | Admitting: Orthopaedic Surgery

## 2021-07-31 ENCOUNTER — Ambulatory Visit: Payer: Medicare Other | Admitting: Internal Medicine

## 2021-07-31 DIAGNOSIS — I10 Essential (primary) hypertension: Secondary | ICD-10-CM | POA: Diagnosis not present

## 2021-07-31 DIAGNOSIS — E1165 Type 2 diabetes mellitus with hyperglycemia: Secondary | ICD-10-CM | POA: Diagnosis not present

## 2021-08-31 DIAGNOSIS — E1165 Type 2 diabetes mellitus with hyperglycemia: Secondary | ICD-10-CM | POA: Diagnosis not present

## 2021-08-31 DIAGNOSIS — I10 Essential (primary) hypertension: Secondary | ICD-10-CM | POA: Diagnosis not present

## 2021-09-30 ENCOUNTER — Encounter: Payer: Self-pay | Admitting: Orthopaedic Surgery

## 2021-09-30 ENCOUNTER — Ambulatory Visit (INDEPENDENT_AMBULATORY_CARE_PROVIDER_SITE_OTHER): Payer: Medicare Other | Admitting: Orthopaedic Surgery

## 2021-09-30 ENCOUNTER — Other Ambulatory Visit: Payer: Self-pay

## 2021-09-30 DIAGNOSIS — G8929 Other chronic pain: Secondary | ICD-10-CM | POA: Diagnosis not present

## 2021-09-30 DIAGNOSIS — M25512 Pain in left shoulder: Secondary | ICD-10-CM | POA: Diagnosis not present

## 2021-09-30 DIAGNOSIS — M25511 Pain in right shoulder: Secondary | ICD-10-CM

## 2021-09-30 NOTE — Progress Notes (Signed)
PROCEDURE NOTE:  The patient request injection, verbal consent was obtained.  The left shoulder was prepped appropriately after time out was performed.   Sterile technique was observed and injection of 1 cc of DepoMedrol 40mg  with several cc's of plain xylocaine. Anesthesia was provided by ethyl chloride and a 20-gauge needle was used to inject the shoulder area. A posterior approach was used.  The injection was tolerated well.  A band aid dressing was applied.  The patient was advised to apply ice later today and tomorrow to the injection sight as needed.  PROCEDURE NOTE:  The patient request injection, verbal consent was obtained.  The right shoulder was prepped appropriately after time out was performed.   Sterile technique was observed and injection of 1 cc of DepoMedrol 40mg  with several cc's of plain xylocaine. Anesthesia was provided by ethyl chloride and a 20-gauge needle was used to inject the shoulder area. A posterior approach was used.  The injection was tolerated well.  A band aid dressing was applied.  The patient was advised to apply ice later today and tomorrow to the injection sight as needed.  Encounter Diagnosis  Name Primary?   Chronic pain of both shoulders Yes   I will see as needed.  Call if any problem.  Precautions discussed.  Electronically Signed Sanjuana Kava, MD 1/31/202310:19 AM

## 2021-10-01 DIAGNOSIS — I10 Essential (primary) hypertension: Secondary | ICD-10-CM | POA: Diagnosis not present

## 2021-10-01 DIAGNOSIS — E1122 Type 2 diabetes mellitus with diabetic chronic kidney disease: Secondary | ICD-10-CM | POA: Diagnosis not present

## 2021-11-06 DIAGNOSIS — E1122 Type 2 diabetes mellitus with diabetic chronic kidney disease: Secondary | ICD-10-CM | POA: Diagnosis not present

## 2021-11-06 DIAGNOSIS — Z0001 Encounter for general adult medical examination with abnormal findings: Secondary | ICD-10-CM | POA: Diagnosis not present

## 2021-11-06 DIAGNOSIS — I10 Essential (primary) hypertension: Secondary | ICD-10-CM | POA: Diagnosis not present

## 2021-11-06 DIAGNOSIS — Z1389 Encounter for screening for other disorder: Secondary | ICD-10-CM | POA: Diagnosis not present

## 2021-11-06 DIAGNOSIS — F17210 Nicotine dependence, cigarettes, uncomplicated: Secondary | ICD-10-CM | POA: Diagnosis not present

## 2021-11-06 DIAGNOSIS — Z23 Encounter for immunization: Secondary | ICD-10-CM | POA: Diagnosis not present

## 2021-11-10 DIAGNOSIS — H401131 Primary open-angle glaucoma, bilateral, mild stage: Secondary | ICD-10-CM | POA: Diagnosis not present

## 2021-11-24 DIAGNOSIS — Z79899 Other long term (current) drug therapy: Secondary | ICD-10-CM | POA: Diagnosis not present

## 2021-11-24 DIAGNOSIS — Z0001 Encounter for general adult medical examination with abnormal findings: Secondary | ICD-10-CM | POA: Diagnosis not present

## 2021-12-07 DIAGNOSIS — M1990 Unspecified osteoarthritis, unspecified site: Secondary | ICD-10-CM | POA: Diagnosis not present

## 2021-12-07 DIAGNOSIS — I10 Essential (primary) hypertension: Secondary | ICD-10-CM | POA: Diagnosis not present

## 2021-12-16 ENCOUNTER — Ambulatory Visit: Payer: Medicare Other | Admitting: Orthopaedic Surgery

## 2021-12-18 ENCOUNTER — Ambulatory Visit (INDEPENDENT_AMBULATORY_CARE_PROVIDER_SITE_OTHER): Payer: Medicare Other | Admitting: Orthopaedic Surgery

## 2021-12-18 ENCOUNTER — Encounter: Payer: Self-pay | Admitting: Orthopaedic Surgery

## 2021-12-18 VITALS — Ht 62.0 in | Wt 206.0 lb

## 2021-12-18 DIAGNOSIS — M25511 Pain in right shoulder: Secondary | ICD-10-CM

## 2021-12-18 DIAGNOSIS — M25512 Pain in left shoulder: Secondary | ICD-10-CM | POA: Diagnosis not present

## 2021-12-18 DIAGNOSIS — G8929 Other chronic pain: Secondary | ICD-10-CM | POA: Diagnosis not present

## 2021-12-18 NOTE — Progress Notes (Signed)
PROCEDURE NOTE: ? ?The patient request injection, verbal consent was obtained. ? ?The left shoulder was prepped appropriately after time out was performed.  ? ?Sterile technique was observed and injection of 1 cc of DepoMedrol '40mg'$  with several cc's of plain xylocaine. Anesthesia was provided by ethyl chloride and a 20-gauge needle was used to inject the shoulder area. A posterior approach was used.  The injection was tolerated well. ? ?A band aid dressing was applied. ? ?The patient was advised to apply ice later today and tomorrow to the injection sight as needed. ? ?PROCEDURE NOTE: ? ?The patient request injection, verbal consent was obtained. ? ?The right shoulder was prepped appropriately after time out was performed.  ? ?Sterile technique was observed and injection of 1 cc of DepoMedrol '40mg'$  with several cc's of plain xylocaine. Anesthesia was provided by ethyl chloride and a 20-gauge needle was used to inject the shoulder area. A posterior approach was used.  The injection was tolerated well. ? ?A band aid dressing was applied. ? ?The patient was advised to apply ice later today and tomorrow to the injection sight as needed. ? ?Encounter Diagnosis  ?Name Primary?  ? Chronic pain of both shoulders Yes  ? ?I will see as needed. ? ?Call if any problem. ? ?Precautions discussed. ? ?Electronically Signed ?Sanjuana Kava, MD ?4/20/20239:17 AM ? ? ?

## 2022-01-06 DIAGNOSIS — I10 Essential (primary) hypertension: Secondary | ICD-10-CM | POA: Diagnosis not present

## 2022-01-06 DIAGNOSIS — M21961 Unspecified acquired deformity of right lower leg: Secondary | ICD-10-CM | POA: Diagnosis not present

## 2022-01-14 ENCOUNTER — Ambulatory Visit (INDEPENDENT_AMBULATORY_CARE_PROVIDER_SITE_OTHER): Payer: Medicare Other | Admitting: Adult Health

## 2022-01-14 ENCOUNTER — Encounter: Payer: Self-pay | Admitting: Adult Health

## 2022-01-14 VITALS — BP 145/72 | HR 94 | Ht 62.0 in | Wt 210.0 lb

## 2022-01-14 DIAGNOSIS — Z1231 Encounter for screening mammogram for malignant neoplasm of breast: Secondary | ICD-10-CM

## 2022-01-14 DIAGNOSIS — Z1211 Encounter for screening for malignant neoplasm of colon: Secondary | ICD-10-CM

## 2022-01-14 DIAGNOSIS — Z01419 Encounter for gynecological examination (general) (routine) without abnormal findings: Secondary | ICD-10-CM

## 2022-01-14 LAB — HEMOCCULT GUIAC POC 1CARD (OFFICE): Fecal Occult Blood, POC: NEGATIVE

## 2022-01-14 NOTE — Progress Notes (Signed)
Patient ID: [name], female   DOB: 1961/02/10, 61 y.o.   MRN: [id number] ?History of Present Illness: ?[name] is a 61 year old black female,single, PM in for a well woman gyn exam. ?Lab Results  ?Component Value Date  ? DIAGPAP  11/11/2020  ?  - Negative for intraepithelial lesion or malignancy (NILM)  ? HPV NOT DETECTED 01/13/2018  ? [place] Negative 11/11/2020  ?  ?PCP is Dr Legrand Rams  ? ?Current Medications, Allergies, Past Medical History, Past Surgical History, Family History and Social History were reviewed in [employer] record.   ? ? ?Review of Systems: ? ?Patient denies any headaches, hearing loss, fatigue, blurred vision, shortness of breath, chest pain, abdominal pain, problems with bowel movements, urination, or intercourse. No joint pain or mood swings.  ?Denies any vaginal bleeding  ? ?Physical Exam:BP (!) 145/72 (BP Location: Left Arm, Patient Position: Sitting, Cuff Size: Normal)   Pulse 94   Ht '5\' 2"'$  (1.575 m)   Wt 210 lb (95.3 kg)   LMP 01/15/2012   BMI 38.41 kg/m?   ?General:  Well developed, well nourished, no acute distress ?Skin:  Warm and dry ?Neck:  Midline trachea, normal thyroid, good ROM, no lymphadenopathy,no carotid bruits heard  ?Lungs; Clear to auscultation bilaterally ?Breast:  No dominant palpable mass, retraction, or nipple discharge ?Cardiovascular: Regular rate and rhythm ?Abdomen:  Soft, non tender, no hepatosplenomegaly ?Pelvic:  External genitalia is normal in appearance, no lesions.  The vagina is pale with loss of rugae. Urethra has no lesions or masses. The cervix is smooth. Uterus is felt to be normal size, shape, and contour.  No adnexal masses or tenderness noted.Bladder is non tender, no masses felt. ?Rectal: Good sphincter tone, no polyps, or hemorrhoids felt.  Hemoccult negative. ?Extremities/musculoskeletal:  No swelling or varicosities noted, no clubbing or cyanosis ?Psych:  No mood changes, alert and cooperative,seems happy ?AA is  2 ?Fall risk is moderate, uses can ? ?  01/14/2022  ?  2:32 PM 11/11/2020  ?  2:24 PM  ?Depression screen PHQ 2/9  ?Decreased Interest 0 0  ?Down, Depressed, Hopeless 0 0  ?PHQ - 2 Score 0 0  ?Altered sleeping 0 0  ?Tired, decreased energy 0 0  ?Change in appetite 0 0  ?Feeling bad or failure about yourself  0 0  ?Trouble concentrating 0 0  ?Moving slowly or fidgety/restless 0 0  ?Suicidal thoughts 0 0  ?PHQ-9 Score 0 0  ?  ? ?  01/14/2022  ?  2:33 PM 11/11/2020  ?  2:24 PM  ?GAD 7 : Generalized Anxiety Score  ?Nervous, Anxious, on Edge 0 0  ?Control/stop worrying 0 0  ?Worry too much - different things 0 0  ?Trouble relaxing 0 0  ?Restless 0 0  ?Easily annoyed or irritable 0 0  ?Afraid - awful might happen 0 0  ?Total GAD 7 Score 0 0  ? ?  ? [place] - 01/14/22 1432   ? ?  ? Pregnancy Intention Screening  ? Does the patient want to become pregnant in the next year? N/A   ? Does the patient's partner want to become pregnant in the next year? N/A   ? Would the patient like to discuss contraceptive options today? N/A   ?  ? Contraception Wrap Up  ? Current Method Female Sterilization   ? End Method Female Sterilization   ? Contraception Counseling Provided No   ? ?  ?  ? ?  ?  Examination chaperoned by Marcelino Scot RN ? ? ?Impression and Plan: ?1. Encounter for well woman exam with routine gynecological exam ?Physical in 1 year ?Pap in 2025 ?Labs with PCP  ?Colonoscopy  per GI ? ?2. Encounter for screening fecal occult blood testing ?Hemoccult was negative  ? ?3. Screening mammogram for breast cancer ?Mammogram scheduled for her 01/22/22 at 12:15 pm at [facility]  ? ? ? ?  ?  ?

## 2022-01-22 ENCOUNTER — Ambulatory Visit (HOSPITAL_COMMUNITY)
Admission: RE | Admit: 2022-01-22 | Discharge: 2022-01-22 | Disposition: A | Discharge status: Home / Self Care | Payer: Medicare Other | Source: Ambulatory Visit | Attending: Adult Health | Admitting: Adult Health

## 2022-01-22 DIAGNOSIS — Z1231 Encounter for screening mammogram for malignant neoplasm of breast: Secondary | ICD-10-CM | POA: Insufficient documentation

## 2022-02-06 DIAGNOSIS — E1122 Type 2 diabetes mellitus with diabetic chronic kidney disease: Secondary | ICD-10-CM | POA: Diagnosis not present

## 2022-02-06 DIAGNOSIS — I10 Essential (primary) hypertension: Secondary | ICD-10-CM | POA: Diagnosis not present

## 2022-02-13 DIAGNOSIS — Z96651 Presence of right artificial knee joint: Secondary | ICD-10-CM | POA: Diagnosis not present

## 2022-02-13 DIAGNOSIS — Z471 Aftercare following joint replacement surgery: Secondary | ICD-10-CM | POA: Diagnosis not present

## 2022-02-13 DIAGNOSIS — M25561 Pain in right knee: Secondary | ICD-10-CM | POA: Diagnosis not present

## 2022-02-13 DIAGNOSIS — M25562 Pain in left knee: Secondary | ICD-10-CM | POA: Diagnosis not present

## 2022-02-17 ENCOUNTER — Ambulatory Visit: Payer: Medicare Other | Admitting: Adult Health

## 2022-02-19 ENCOUNTER — Other Ambulatory Visit (HOSPITAL_COMMUNITY)
Admission: RE | Admit: 2022-02-19 | Discharge: 2022-02-19 | Disposition: A | Discharge status: Home / Self Care | Payer: Medicare Other | Source: Ambulatory Visit | Attending: Adult Health | Admitting: Adult Health

## 2022-02-19 ENCOUNTER — Ambulatory Visit (INDEPENDENT_AMBULATORY_CARE_PROVIDER_SITE_OTHER): Payer: Medicare Other | Admitting: Adult Health

## 2022-02-19 ENCOUNTER — Encounter: Payer: Self-pay | Admitting: Adult Health

## 2022-02-19 VITALS — BP 130/84 | HR 82 | Ht 62.0 in | Wt 208.4 lb

## 2022-02-19 DIAGNOSIS — N898 Other specified noninflammatory disorders of vagina: Secondary | ICD-10-CM | POA: Diagnosis present

## 2022-02-19 DIAGNOSIS — Z113 Encounter for screening for infections with a predominantly sexual mode of transmission: Secondary | ICD-10-CM | POA: Diagnosis present

## 2022-02-19 NOTE — Progress Notes (Signed)
  Subjective:     Patient ID: [name], female   DOB: Dec 11, 1960, 61 y.o.   MRN: [id number]  HPI [name] is a 61 year old black female,single, PM in complaining of vaginal odor and vaginal discharge. Lab Results  Component Value Date   DIAGPAP  11/11/2020    - Negative for intraepithelial lesion or malignancy (NILM)   HPV NOT DETECTED 01/13/2018   [place] Negative 11/11/2020    PCP is Dr Legrand Rams  Review of Systems +vaginal discharge +vaginal odor Reviewed past medical,surgical, social and family history. Reviewed medications and allergies.     Objective:   Physical Exam BP 130/84 (BP Location: Right Arm, Patient Position: Sitting, Cuff Size: Normal)   Pulse 82   Ht '5\' 2"'$  (1.575 m)   Wt 208 lb 6.4 oz (94.5 kg)   LMP 01/15/2012   BMI 38.12 kg/m     Skin warm and dry.Pelvic: external genitalia is normal in appearance no lesions, vagina: white discharge with slight odor,urethra has no lesions or masses noted, cervix:smooth, uterus: normal size, shape and contour, non tender, no masses felt, adnexa: no masses or tenderness noted. Bladder is non tender and no masses felt. CV swab obtained  [facility] - 02/19/22 1103       Pregnancy Intention Screening   Does the patient want to become pregnant in the next year? N/A    Does the patient's partner want to become pregnant in the next year? N/A    Would the patient like to discuss contraceptive options today? N/A      Contraception Wrap Up   Current Method Female Sterilization    End Method Female Sterilization    Contraception Counseling Provided No             Assessment:     1. Vaginal discharge CV swab sent for GC/CHL,trich,BV and yeast, will talk when results back - Cervicovaginal ancillary only( [place])  2. Vaginal odor - Cervicovaginal ancillary only( [place])  3. Screening examination for STD (sexually transmitted disease) - Cervicovaginal ancillary only( [place])     Plan:     Follow up  prn

## 2022-02-20 LAB — CERVICOVAGINAL ANCILLARY ONLY
Bacterial Vaginitis (gardnerella): NEGATIVE
Candida Glabrata: NEGATIVE
Candida Vaginitis: POSITIVE — AB
Chlamydia: NEGATIVE
Comment: NEGATIVE
Comment: NEGATIVE
Comment: NEGATIVE
Comment: NEGATIVE
Comment: NEGATIVE
Comment: NORMAL
Neisseria Gonorrhea: NEGATIVE
Trichomonas: NEGATIVE

## 2022-02-23 ENCOUNTER — Telehealth: Payer: Self-pay | Admitting: Adult Health

## 2022-02-23 MED ORDER — FLUCONAZOLE 150 MG PO TABS: ORAL_TABLET | ORAL | 1 refills | Status: DC

## 2022-03-08 DIAGNOSIS — I10 Essential (primary) hypertension: Secondary | ICD-10-CM | POA: Diagnosis not present

## 2022-03-08 DIAGNOSIS — E1165 Type 2 diabetes mellitus with hyperglycemia: Secondary | ICD-10-CM | POA: Diagnosis not present

## 2022-03-09 DIAGNOSIS — M84361A Stress fracture, right tibia, initial encounter for fracture: Secondary | ICD-10-CM | POA: Diagnosis not present

## 2022-03-09 DIAGNOSIS — Z471 Aftercare following joint replacement surgery: Secondary | ICD-10-CM | POA: Diagnosis not present

## 2022-03-09 DIAGNOSIS — Z96651 Presence of right artificial knee joint: Secondary | ICD-10-CM | POA: Diagnosis not present

## 2022-03-12 ENCOUNTER — Encounter: Payer: Self-pay | Admitting: Orthopaedic Surgery

## 2022-03-12 ENCOUNTER — Ambulatory Visit (INDEPENDENT_AMBULATORY_CARE_PROVIDER_SITE_OTHER): Payer: Medicare Other | Admitting: Orthopaedic Surgery

## 2022-03-12 VITALS — Ht 62.0 in | Wt 208.0 lb

## 2022-03-12 DIAGNOSIS — G8929 Other chronic pain: Secondary | ICD-10-CM

## 2022-03-12 DIAGNOSIS — M25512 Pain in left shoulder: Secondary | ICD-10-CM

## 2022-03-12 DIAGNOSIS — M25511 Pain in right shoulder: Secondary | ICD-10-CM | POA: Diagnosis not present

## 2022-03-12 NOTE — Progress Notes (Signed)
PROCEDURE NOTE:  The patient request injection, verbal consent was obtained.  The left shoulder was prepped appropriately after time out was performed.   Sterile technique was observed and injection of 1 cc of DepoMedrol '40mg'$  with several cc's of plain xylocaine. Anesthesia was provided by ethyl chloride and a 20-gauge needle was used to inject the shoulder area. A posterior approach was used.  The injection was tolerated well.  A band aid dressing was applied.  The patient was advised to apply ice later today and tomorrow to the injection sight as needed.   PROCEDURE NOTE:  The patient request injection, verbal consent was obtained.  The right shoulder was prepped appropriately after time out was performed.   Sterile technique was observed and injection of 1 cc of DepoMedrol '40mg'$  with several cc's of plain xylocaine. Anesthesia was provided by ethyl chloride and a 20-gauge needle was used to inject the shoulder area. A posterior approach was used.  The injection was tolerated well.  A band aid dressing was applied.  The patient was advised to apply ice later today and tomorrow to the injection sight as needed.  Encounter Diagnosis  Name Primary?   Chronic pain of both shoulders Yes   Return prn  Call if any problem.  Precautions discussed.  Electronically Signed Sanjuana Kava, MD 7/13/20238:44 AM

## 2022-03-17 ENCOUNTER — Encounter: Payer: Self-pay | Admitting: Internal Medicine

## 2022-03-23 DIAGNOSIS — H401131 Primary open-angle glaucoma, bilateral, mild stage: Secondary | ICD-10-CM | POA: Diagnosis not present

## 2022-03-26 ENCOUNTER — Ambulatory Visit (INDEPENDENT_AMBULATORY_CARE_PROVIDER_SITE_OTHER): Payer: Medicare Other | Admitting: Gastroenterology

## 2022-03-26 ENCOUNTER — Encounter: Payer: Self-pay | Admitting: Gastroenterology

## 2022-03-26 DIAGNOSIS — K642 Third degree hemorrhoids: Secondary | ICD-10-CM

## 2022-03-26 NOTE — Patient Instructions (Signed)
I will see you back for hemorrhoid banding in the near future!  Avoid straining. Limit toilet time to 2-3 minutes at the most.   Continue Linzess as needed.  I enjoyed seeing you again today! As you know, I value our relationship and want to provide genuine, compassionate, and quality care. I welcome your feedback. If you receive a survey regarding your visit,  I greatly appreciate you taking time to fill this out. See you next time!  Annitta Needs, PhD, ANP-BC [facility]

## 2022-03-26 NOTE — Progress Notes (Signed)
Gastroenterology Office Note     Primary Care Physician:  Carrolyn Meiers, MD  Primary Gastroenterologist: Dr. Abbey Chatters    Chief Complaint   Chief Complaint  Patient presents with   Follow-up    Hemorrhoid bleeding off and on     History of Present Illness   [name] is a 61 y.o. female presenting today in follow-up with a history of constipation, GERD, and rectal bleeding. Last colonoscopy in 2019 with multiple hyperplastic polyps and internal hemorrhoids. She presents today to discuss hemorrhoid banding.   Notes intermittent rectal bleeding. Linzess 145 mcg just as needed. Notes a piece of "meat" hung out the other day from rectum, and she reduced this manually. No pain. She does not want to do a banding today but eager to do in the future.    Past Medical History:  Diagnosis Date   Arthritis    Complication of anesthesia    Diabetes mellitus ([place])    Type II   DOE (dyspnea on exertion)    Eczema    GERD (gastroesophageal reflux disease)    Glaucoma    Hypercholesterolemia    Hypertension    Infection    r knee   Muscle spasm of back    Muscle spasms of both lower extremities    PONV (postoperative nausea and vomiting)    Positive ANA (antinuclear antibody)    1:180 homogenous pattern   Trichimoniasis 10/04/2019   Treated 10/02/19 PCO______    Past Surgical History:  Procedure Laterality Date   COLONOSCOPY WITH PROPOFOL N/A 06/14/2018   Five sessile polyps in rectum and sigmoid, 4-6 mm in size. Few small and large mouthed diverticula in recto-sigmoid, sigmoid colon, and descending colon. Internal hemorrhoids. Hyperplastic polyps. Colonoscopy in 2024 as prep in right colon not ideal.    IRRIGATION AND DEBRIDEMENT KNEE Right 05/12/2013   Procedure: IRRIGATION AND DEBRIDEMENT KNEE;  Surgeon: Carole Civil, MD;  Location: [facility];  Service: Orthopedics;  Laterality: Right;   JOINT REPLACEMENT     r knee   LIPOMA EXCISION  11/16/2011    Procedure: EXCISION LIPOMA;  Surgeon: Scherry Ran, MD;  Location: [facility];  Service: General;  Laterality: Left;  Excision of neoplasm left shoulder   MOUTH SURGERY     MULTIPLE EXTRACTIONS WITH ALVEOLOPLASTY  12/28/2011   Procedure: MULTIPLE EXTRACION WITH ALVEOLOPLASTY;  Surgeon: Gae Bon, DDS;  Location: [place];  Service: Oral Surgery;  Laterality: Bilateral;   PATELLAR TENDON REPAIR Right 05/12/2013   Procedure: PATELLA TENDON REPAIR AND ALLOGRAFT RECONSTRUCTION;  Surgeon: Carole Civil, MD;  Location: [facility];  Service: Orthopedics;  Laterality: Right;   POLYPECTOMY  06/14/2018   Procedure: POLYPECTOMY;  Surgeon: Danie Binder, MD;  [facility];  Service: Endoscopy;;   resection arthroplasty right total knee  01/28/16   Dr Starlyn Skeans, [facility]; antibiotic spacer placed   SHOULDER SURGERY Left    TOOTH EXTRACTION N/A 12/08/2019   Procedure: DENTAL RESTORATION/EXTRACTIONS OF TEETH # SIX, ELEVEN, AND TWENTY-SEVEN;  Surgeon: Diona Browner, DDS;  Location: [place];  Service: Oral Surgery;  Laterality: N/A;   TOTAL KNEE ARTHROPLASTY Right 04/17/2013   Procedure: RIGHT TOTAL KNEE ARTHROPLASTY;  Surgeon: Carole Civil, MD;  Location: [facility];  Service: Orthopedics;  Laterality: Right;   TOTAL KNEE ARTHROPLASTY Right 05/12/2013   Procedure: POLY EXCHANGE;  Surgeon: Carole Civil, MD;  Location: [facility];  Service: Orthopedics;  Laterality: Right;   TUBAL LIGATION  and burned per patient.     Current Outpatient Medications  Medication Sig Dispense Refill   amLODipine (NORVASC) 5 MG tablet Take 5 mg by mouth daily.      aspirin 81 MG tablet Take 81 mg by mouth daily.      atorvastatin (LIPITOR) 20 MG tablet Take 20 mg by mouth daily.     bisacodyl (DULCOLAX) 5 MG EC tablet Take 5 mg daily as needed by mouth for moderate constipation.     cyclobenzaprine (FLEXERIL) 10 MG tablet Take 10 mg by mouth as needed for muscle spasms.      Fluocinolone Acetonide Scalp 0.01  % OIL Apply 1 application topically at bedtime as needed (Eczema in hair).      gabapentin (NEURONTIN) 300 MG capsule Take 300 mg by mouth daily.     hydrochlorothiazide (HYDRODIURIL) 12.5 MG tablet Take 12.5 mg by mouth daily.     IBU 800 MG tablet Take 800 mg by mouth daily as needed for mild pain or moderate pain.      ketoconazole (NIZORAL) 2 % cream Apply 1 application topically daily.     metFORMIN (GLUCOPHAGE) 500 MG tablet Take 1,000 mg by mouth 2 (two) times daily.      pantoprazole (PROTONIX) 40 MG tablet TAKE 1 TABLET BY MOUTH 30 MINUTES BEFORE BREAKFAST DAILY. 90 tablet 3   traMADol (ULTRAM) 50 MG tablet Take by mouth in the morning and at bedtime.     travoprost, benzalkonium, (TRAVATAN) 0.004 % ophthalmic solution Place 1 drop into both eyes at bedtime.      zolpidem (AMBIEN) 10 MG tablet Take 1 tablet (10 mg total) by mouth at bedtime as needed for sleep. 30 tablet 0   benzonatate (TESSALON) 100 MG capsule Take 1 capsule (100 mg total) by mouth every 8 (eight) hours. (Patient not taking: Reported on 03/26/2022) 21 capsule 0   diphenhydrAMINE (BENADRYL) 25 MG tablet Take 1 tablet (25 mg total) by mouth 3 (three) times daily. Take one tablet three times daily for two days (Patient not taking: Reported on 03/26/2022) 10 tablet 0   fluconazole (DIFLUCAN) 150 MG tablet Take 1 now and 1 in 3 days (Patient not taking: Reported on 03/26/2022) 2 tablet 1   linaclotide (LINZESS) 145 MCG CAPS capsule Take 1 capsule (145 mcg total) by mouth daily before breakfast. 30 capsule 5   linagliptin (TRADJENTA) 5 MG TABS tablet Take 5 mg by mouth daily. (Patient not taking: Reported on 03/26/2022)     predniSONE (STERAPRED UNI-PAK 21 TAB) 10 MG (21) TBPK tablet Take by mouth daily. Take 6 tabs by mouth daily  for 2 days, then 5 tabs for 2 days, then 4 tabs for 2 days, then 3 tabs for 2 days, 2 tabs for 2 days, then 1 tab by mouth daily for 2 days (Patient not taking: Reported on 03/26/2022) 42 tablet 0   No  current facility-administered medications for this visit.    Allergies as of 03/26/2022 - Review Complete 03/26/2022  Allergen Reaction Noted   Amoxicillin Swelling and Other (See Comments) 03/22/2018   Chocolate Hives 11/13/2011   Nystatin Anxiety 02/06/2016    Family History  Problem Relation Age of Onset   Diabetes Brother    Diabetes Mother    Multiple sclerosis Sister    Arthritis Neg Hx    Anesthesia problems Neg Hx    Hypotension Neg Hx    Malignant hyperthermia Neg Hx    Pseudochol deficiency Neg Hx  Cancer Neg Hx    Heart disease Neg Hx    Stroke Neg Hx    Colon cancer Neg Hx    Colon polyps Neg Hx     Social History   Socioeconomic History   Marital status: Single    Spouse name: Not on file   Number of children: Not on file   Years of education: 12   Highest education level: Not on file  Occupational History    Employer: NOT EMPLOYED  Tobacco Use   Smoking status: Some Days    Packs/day: 0.10    Years: 1.00    Total pack years: 0.10    Types: Cigarettes    Last attempt to quit: 01/28/2016    Years since quitting: 6.1   Smokeless tobacco: Never   Tobacco comments:    rare   Vaping Use   Vaping Use: Never used  Substance and Sexual Activity   Alcohol use: Yes    Alcohol/week: 2.0 standard drinks of alcohol    Types: 2 Cans of beer per week    Comment: "once every blue moon"    Drug use: No   Sexual activity: Yes    Birth control/protection: Post-menopausal, Surgical    Comment: tubal  Other Topics Concern   Not on file  Social History Narrative   Not on file   Social Determinants of Health   Financial Resource Strain: Low Risk  (01/14/2022)   Overall Financial Resource Strain (CARDIA)    Difficulty of Paying Living Expenses: Not hard at all  Food Insecurity: No Food Insecurity (01/14/2022)   Hunger Vital Sign    Worried About Running Out of Food in the Last Year: Never true    Ran Out of Food in the Last Year: Never true  Transportation  Needs: No Transportation Needs (01/14/2022)   PRAPARE - Hydrologist (Medical): No    Lack of Transportation (Non-Medical): No  Physical Activity: Inactive (01/14/2022)   Exercise Vital Sign    Days of Exercise per Week: 0 days    Minutes of Exercise per Session: 0 min  Stress: No Stress Concern Present (01/14/2022)   [place]    Feeling of Stress : Not at all  Social Connections: Unknown (01/14/2022)   Social Connection and Isolation Panel [NHANES]    Frequency of Communication with Friends and Family: Never    Frequency of Social Gatherings with Friends and Family: Never    Attends Religious Services: Patient refused    Marine scientist or Organizations: No    Attends Archivist Meetings: Never    Marital Status: Never married  Intimate Partner Violence: Not At Risk (01/14/2022)   Humiliation, Afraid, Rape, and Kick questionnaire    Fear of Current or Ex-Partner: No    Emotionally Abused: No    Physically Abused: No    Sexually Abused: No     Review of Systems   Gen: Denies any fever, chills, fatigue, weight loss, lack of appetite.  CV: Denies chest pain, heart palpitations, peripheral edema, syncope.  Resp: Denies shortness of breath at rest or with exertion. Denies wheezing or cough.  GI: see HPI GU : Denies urinary burning, urinary frequency, urinary hesitancy MS: Denies joint pain, muscle weakness, cramps, or limitation of movement.  Derm: Denies rash, itching, dry skin Psych: Denies depression, anxiety, memory loss, and confusion Heme: Denies bruising, bleeding, and enlarged lymph nodes.   Physical  Exam   BP 118/76   Pulse 90   Temp (!) 97.3 F (36.3 C)   Ht '5\' 2"'$  (1.575 m)   Wt 208 lb 6.4 oz (94.5 kg)   LMP 01/15/2012   BMI 38.12 kg/m  General:   Alert and oriented. Pleasant and cooperative. Well-nourished and well-developed.  Head:  Normocephalic and  atraumatic. Eyes:  Without icterus Rectal:  no external hemorrhoids. No mass on DRE. Anoscopy will be completed at next visit.  Msk:  Symmetrical without gross deformities. Normal posture. Extremities:  Without edema. Neurologic:  Alert and  oriented x4;  grossly normal neurologically. Skin:  Intact without significant lesions or rashes. Psych:  Alert and cooperative. Normal mood and affect.   Assessment   KASSIE KENG is a 61 y.o. female presenting today in follow-up with a history of symptomatic Grade 3 hemorrhoids, with last colonoscopy in 2019.   She is an excellent candidate for banding. I offered this for her today, but she would like to hold off on this.   We will arrange follow-up in near future.    PLAN    Continue Linzess prn Follow-up for banding when patient able   Annitta Needs, PhD, ANP-BC [facility]

## 2022-04-08 DIAGNOSIS — I10 Essential (primary) hypertension: Secondary | ICD-10-CM | POA: Diagnosis not present

## 2022-04-08 DIAGNOSIS — E1165 Type 2 diabetes mellitus with hyperglycemia: Secondary | ICD-10-CM | POA: Diagnosis not present

## 2022-04-09 ENCOUNTER — Encounter: Payer: Medicare Other | Admitting: Gastroenterology

## 2022-04-16 ENCOUNTER — Encounter: Payer: Self-pay | Admitting: Gastroenterology

## 2022-04-16 ENCOUNTER — Ambulatory Visit (INDEPENDENT_AMBULATORY_CARE_PROVIDER_SITE_OTHER): Payer: Medicare Other | Admitting: Gastroenterology

## 2022-04-16 VITALS — BP 143/84 | HR 86 | Temp 97.1°F | Ht 62.0 in | Wt 209.8 lb

## 2022-04-16 DIAGNOSIS — K642 Third degree hemorrhoids: Secondary | ICD-10-CM

## 2022-04-16 NOTE — Patient Instructions (Signed)
Please continue to avoid straining.  You should limit your toilet time to 2-3 minutes at the most.   Continue to avoid constipation.  Please call me with any concerns or issues!  I will see you in follow-up for additional banding in several weeks.    I enjoyed seeing you again today! As you know, I value our relationship and want to provide genuine, compassionate, and quality care. I welcome your feedback. If you receive a survey regarding your visit,  I greatly appreciate you taking time to fill this out. See you next time!  Umair Rosiles W. Tayden Duran, PhD, ANP-BC [facility]       

## 2022-04-16 NOTE — Progress Notes (Signed)
    [place] BANDING PROCEDURE NOTE  [name] is a 61 y.o. female presenting today for consideration of hemorrhoid banding. Last colonoscopy 2019 with multiple hyperplastic polyps and internal hemorrhoids. She has had prolapse and bleeding.    The patient presents with symptomatic grade 3 hemorrhoids, unresponsive to maximal medical therapy, requesting rubber band ligation of her hemorrhoidal disease. All risks, benefits, and alternative forms of therapy were described and informed consent was obtained.  In the left lateral decubitus position, anoscopic examination revealed grade 3 hemorrhoids predominantly in right posterior followed by right anterior position (s).  The decision was made to band the right posterior internal hemorrhoid, and the [place] was used to perform band ligation without complication. Digital anorectal examination was then performed to assure proper positioning of the band, and to adjust the banded tissue as required. The patient was discharged home without pain or other issues. Dietary and behavioral recommendations were given, along with follow-up instructions. The patient will return in several weeks for followup and possible additional banding as required.  No complications were encountered and the patient tolerated the procedure well.   Annitta Needs, PhD, ANP-BC [facility]

## 2022-04-27 ENCOUNTER — Other Ambulatory Visit: Payer: Self-pay | Admitting: Gastroenterology

## 2022-04-28 NOTE — Telephone Encounter (Signed)
Noted  

## 2022-04-30 ENCOUNTER — Encounter: Payer: Self-pay | Admitting: Gastroenterology

## 2022-04-30 ENCOUNTER — Ambulatory Visit (INDEPENDENT_AMBULATORY_CARE_PROVIDER_SITE_OTHER): Payer: Medicare Other | Admitting: Gastroenterology

## 2022-04-30 VITALS — BP 134/73 | HR 94 | Temp 97.1°F | Ht 62.0 in | Wt 213.8 lb

## 2022-04-30 DIAGNOSIS — K642 Third degree hemorrhoids: Secondary | ICD-10-CM | POA: Diagnosis not present

## 2022-04-30 NOTE — Progress Notes (Signed)
    [place] BANDING PROCEDURE NOTE  [name] is a 61 y.o. female presenting today for consideration of hemorrhoid banding. Last colonoscopy 2019 with multiple hyperplastic polyps and internal hemorrhoids. She has had prolapse and bleeding. She has had right posterior banding.    The patient presents with symptomatic grade 3 hemorrhoids, unresponsive to maximal medical therapy, requesting rubber band ligation of his/her hemorrhoidal disease. All risks, benefits, and alternative forms of therapy were described and informed consent was obtained.  The decision was made to neutrally, and the [place] was used to perform band ligation without complication. Neutral position was used as right anterior did not feel to be obtaining tissue correctly. Digital anorectal examination was then performed to assure proper positioning of the band, and to adjust the banded tissue as required. The patient was discharged home without pain or other issues. Dietary and behavioral recommendations were given, along with follow-up instructions. The patient will return in several weeks for followup and possible additional banding as required. We will perform an anoscopy at next visit.   No complications were encountered and the patient tolerated the procedure well.   Annitta Needs, PhD, ANP-BC [facility]

## 2022-04-30 NOTE — Patient Instructions (Signed)
  Please avoid straining.  You should limit your toilet time to 2-3 minutes at the most.   I recommend Benefiber 2 teaspoons each morning in the beverage of your choice!  Please call me with any concerns or issues!  I will see you in follow-up for additional banding in several weeks.  I enjoyed seeing you again today! As you know, I value our relationship and want to provide genuine, compassionate, and quality care. I welcome your feedback. If you receive a survey regarding your visit,  I greatly appreciate you taking time to fill this out. See you next time!  Jerricka Carvey W. Phillips Goulette, PhD, ANP-BC [facility]         

## 2022-05-14 ENCOUNTER — Encounter: Payer: Medicare Other | Admitting: Gastroenterology

## 2022-05-19 DIAGNOSIS — K219 Gastro-esophageal reflux disease without esophagitis: Secondary | ICD-10-CM | POA: Diagnosis not present

## 2022-05-19 DIAGNOSIS — Z23 Encounter for immunization: Secondary | ICD-10-CM | POA: Diagnosis not present

## 2022-05-19 DIAGNOSIS — E1122 Type 2 diabetes mellitus with diabetic chronic kidney disease: Secondary | ICD-10-CM | POA: Diagnosis not present

## 2022-05-19 DIAGNOSIS — I10 Essential (primary) hypertension: Secondary | ICD-10-CM | POA: Diagnosis not present

## 2022-05-19 DIAGNOSIS — M1990 Unspecified osteoarthritis, unspecified site: Secondary | ICD-10-CM | POA: Diagnosis not present

## 2022-06-02 DIAGNOSIS — Z780 Asymptomatic menopausal state: Secondary | ICD-10-CM | POA: Diagnosis not present

## 2022-06-17 ENCOUNTER — Ambulatory Visit (INDEPENDENT_AMBULATORY_CARE_PROVIDER_SITE_OTHER): Payer: Medicare Other | Admitting: Orthopaedic Surgery

## 2022-06-17 ENCOUNTER — Encounter: Payer: Self-pay | Admitting: Orthopaedic Surgery

## 2022-06-17 DIAGNOSIS — F17210 Nicotine dependence, cigarettes, uncomplicated: Secondary | ICD-10-CM

## 2022-06-17 DIAGNOSIS — Z720 Tobacco use: Secondary | ICD-10-CM | POA: Diagnosis not present

## 2022-06-17 DIAGNOSIS — E119 Type 2 diabetes mellitus without complications: Secondary | ICD-10-CM | POA: Diagnosis not present

## 2022-06-17 DIAGNOSIS — M25512 Pain in left shoulder: Secondary | ICD-10-CM | POA: Diagnosis not present

## 2022-06-17 DIAGNOSIS — M25511 Pain in right shoulder: Secondary | ICD-10-CM

## 2022-06-17 DIAGNOSIS — M8580 Other specified disorders of bone density and structure, unspecified site: Secondary | ICD-10-CM | POA: Diagnosis not present

## 2022-06-17 DIAGNOSIS — K219 Gastro-esophageal reflux disease without esophagitis: Secondary | ICD-10-CM | POA: Diagnosis not present

## 2022-06-17 DIAGNOSIS — Z9181 History of falling: Secondary | ICD-10-CM | POA: Diagnosis not present

## 2022-06-17 DIAGNOSIS — G8929 Other chronic pain: Secondary | ICD-10-CM

## 2022-06-17 MED ORDER — METHYLPREDNISOLONE ACETATE 40 MG/ML IJ SUSP
40.0000 mg | Freq: Once | INTRAMUSCULAR | Status: AC
  Administered 2022-06-17: 40 mg via INTRA_ARTICULAR

## 2022-06-17 NOTE — Addendum Note (Signed)
Addended by: Obie Dredge A on: 06/17/2022 09:07 AM   Modules accepted: Orders

## 2022-06-17 NOTE — Progress Notes (Signed)
PROCEDURE NOTE:  The patient request injection, verbal consent was obtained.  The left shoulder was prepped appropriately after time out was performed.   Sterile technique was observed and injection of 1 cc of DepoMedrol '40mg'$  with several cc's of plain xylocaine. Anesthesia was provided by ethyl chloride and a 20-gauge needle was used to inject the shoulder area. A posterior approach was used.  The injection was tolerated well.  A band aid dressing was applied.  The patient was advised to apply ice later today and tomorrow to the injection sight as needed.  PROCEDURE NOTE:  The patient request injection, verbal consent was obtained.  The right shoulder was prepped appropriately after time out was performed.   Sterile technique was observed and injection of 1 cc of DepoMedrol '40mg'$  with several cc's of plain xylocaine. Anesthesia was provided by ethyl chloride and a 20-gauge needle was used to inject the shoulder area. A posterior approach was used.  The injection was tolerated well.  A band aid dressing was applied.  The patient was advised to apply ice later today and tomorrow to the injection sight as needed.  Encounter Diagnoses  Name Primary?   Chronic pain of both shoulders Yes   Cigarette nicotine dependence without complication    Return prn  Call if any problem.  Precautions discussed.  Electronically Signed Sanjuana Kava, MD 10/18/20238:53 AM

## 2022-06-18 DIAGNOSIS — E1122 Type 2 diabetes mellitus with diabetic chronic kidney disease: Secondary | ICD-10-CM | POA: Diagnosis not present

## 2022-06-18 DIAGNOSIS — I10 Essential (primary) hypertension: Secondary | ICD-10-CM | POA: Diagnosis not present

## 2022-06-30 ENCOUNTER — Other Ambulatory Visit: Payer: Self-pay | Admitting: Internal Medicine

## 2022-07-06 DIAGNOSIS — L308 Other specified dermatitis: Secondary | ICD-10-CM | POA: Diagnosis not present

## 2022-07-06 DIAGNOSIS — L304 Erythema intertrigo: Secondary | ICD-10-CM | POA: Diagnosis not present

## 2022-07-19 DIAGNOSIS — I10 Essential (primary) hypertension: Secondary | ICD-10-CM | POA: Diagnosis not present

## 2022-07-28 ENCOUNTER — Encounter: Payer: Self-pay | Admitting: Orthopaedic Surgery

## 2022-07-28 ENCOUNTER — Ambulatory Visit (INDEPENDENT_AMBULATORY_CARE_PROVIDER_SITE_OTHER): Payer: Medicare Other | Admitting: Orthopaedic Surgery

## 2022-07-28 DIAGNOSIS — M25511 Pain in right shoulder: Secondary | ICD-10-CM | POA: Diagnosis not present

## 2022-07-28 DIAGNOSIS — M25512 Pain in left shoulder: Secondary | ICD-10-CM

## 2022-07-28 DIAGNOSIS — G8929 Other chronic pain: Secondary | ICD-10-CM | POA: Diagnosis not present

## 2022-07-28 MED ORDER — METHYLPREDNISOLONE ACETATE 40 MG/ML IJ SUSP
40.0000 mg | Freq: Once | INTRAMUSCULAR | Status: AC
  Administered 2022-07-28: 40 mg via INTRA_ARTICULAR

## 2022-07-28 NOTE — Progress Notes (Signed)
PROCEDURE NOTE:  The patient request injection, verbal consent was obtained.  The right shoulder was prepped appropriately after time out was performed.   Sterile technique was observed and injection of 1 cc of DepoMedrol '40mg'$  with several cc's of plain xylocaine. Anesthesia was provided by ethyl chloride and a 20-gauge needle was used to inject the shoulder area. A posterior approach was used.  The injection was tolerated well.  A band aid dressing was applied.  The patient was advised to apply ice later today and tomorrow to the injection sight as needed.  PROCEDURE NOTE:  The patient request injection, verbal consent was obtained.  The left shoulder was prepped appropriately after time out was performed.   Sterile technique was observed and injection of 1 cc of DepoMedrol '40mg'$  with several cc's of plain xylocaine. Anesthesia was provided by ethyl chloride and a 20-gauge needle was used to inject the shoulder area. A posterior approach was used.  The injection was tolerated well.  A band aid dressing was applied.  The patient was advised to apply ice later today and tomorrow to the injection sight as needed.  Encounter Diagnosis  Name Primary?   Chronic pain of both shoulders Yes   Return in six weeks.  Call if any problem.  Precautions discussed.  Electronically Signed Sanjuana Kava, MD 11/28/20231:34 PM

## 2022-08-04 DIAGNOSIS — H25813 Combined forms of age-related cataract, bilateral: Secondary | ICD-10-CM | POA: Diagnosis not present

## 2022-08-04 DIAGNOSIS — E119 Type 2 diabetes mellitus without complications: Secondary | ICD-10-CM | POA: Diagnosis not present

## 2022-08-04 DIAGNOSIS — H401131 Primary open-angle glaucoma, bilateral, mild stage: Secondary | ICD-10-CM | POA: Diagnosis not present

## 2022-08-18 DIAGNOSIS — I10 Essential (primary) hypertension: Secondary | ICD-10-CM | POA: Diagnosis not present

## 2022-08-18 DIAGNOSIS — E1122 Type 2 diabetes mellitus with diabetic chronic kidney disease: Secondary | ICD-10-CM | POA: Diagnosis not present

## 2022-08-19 DIAGNOSIS — H5213 Myopia, bilateral: Secondary | ICD-10-CM | POA: Diagnosis not present

## 2022-08-19 DIAGNOSIS — H524 Presbyopia: Secondary | ICD-10-CM | POA: Diagnosis not present

## 2022-08-19 DIAGNOSIS — H52203 Unspecified astigmatism, bilateral: Secondary | ICD-10-CM | POA: Diagnosis not present

## 2022-09-16 ENCOUNTER — Encounter: Payer: Self-pay | Admitting: Orthopaedic Surgery

## 2022-09-16 ENCOUNTER — Ambulatory Visit (INDEPENDENT_AMBULATORY_CARE_PROVIDER_SITE_OTHER): Payer: 59 | Admitting: Orthopaedic Surgery

## 2022-09-16 VITALS — BP 141/86 | HR 92

## 2022-09-16 DIAGNOSIS — F17210 Nicotine dependence, cigarettes, uncomplicated: Secondary | ICD-10-CM

## 2022-09-16 DIAGNOSIS — G8929 Other chronic pain: Secondary | ICD-10-CM

## 2022-09-16 DIAGNOSIS — M25511 Pain in right shoulder: Secondary | ICD-10-CM | POA: Diagnosis not present

## 2022-09-16 DIAGNOSIS — M25512 Pain in left shoulder: Secondary | ICD-10-CM

## 2022-09-16 NOTE — Progress Notes (Signed)
PROCEDURE NOTE:  The patient request injection, verbal consent was obtained.  The left shoulder was prepped appropriately after time out was performed.   Sterile technique was observed and injection of 1 cc of DepoMedrol '40mg'$  with several cc's of plain xylocaine. Anesthesia was provided by ethyl chloride and a 20-gauge needle was used to inject the shoulder area. A posterior approach was used.  The injection was tolerated well.  A band aid dressing was applied.  The patient was advised to apply ice later today and tomorrow to the injection sight as needed.   PROCEDURE NOTE:  The patient request injection, verbal consent was obtained.  The right shoulder was prepped appropriately after time out was performed.   Sterile technique was observed and injection of 1 cc of DepoMedrol '40mg'$  with several cc's of plain xylocaine. Anesthesia was provided by ethyl chloride and a 20-gauge needle was used to inject the shoulder area. A posterior approach was used.  The injection was tolerated well.  A band aid dressing was applied.  The patient was advised to apply ice later today and tomorrow to the injection sight as needed.   Encounter Diagnoses  Name Primary?   Chronic pain of both shoulders Yes   Cigarette nicotine dependence without complication    Return in six weeks.  Call if any problem.  Precautions discussed.  Electronically Signed Sanjuana Kava, MD 1/17/20249:20 AM

## 2022-09-18 DIAGNOSIS — E1122 Type 2 diabetes mellitus with diabetic chronic kidney disease: Secondary | ICD-10-CM | POA: Diagnosis not present

## 2022-09-18 DIAGNOSIS — I10 Essential (primary) hypertension: Secondary | ICD-10-CM | POA: Diagnosis not present

## 2022-09-21 ENCOUNTER — Ambulatory Visit: Payer: 59 | Admitting: Adult Health

## 2022-09-23 ENCOUNTER — Telehealth: Payer: Self-pay | Admitting: Adult Health

## 2022-09-23 NOTE — Telephone Encounter (Signed)
Per Mrs. Laurann Montana I tired calling the patient to reschedule her appointment due to Mrs. Laurann Montana has a class that she has to go. No answer and no voice mail set up.I did try the other contact numbers that she has on her chart and no answer no voice mail. ep

## 2022-09-24 ENCOUNTER — Ambulatory Visit: Payer: 59 | Admitting: Adult Health

## 2022-09-25 ENCOUNTER — Ambulatory Visit: Payer: 59 | Admitting: Adult Health

## 2022-09-28 ENCOUNTER — Encounter: Payer: Self-pay | Admitting: Adult Health

## 2022-09-28 ENCOUNTER — Other Ambulatory Visit (HOSPITAL_COMMUNITY)
Admission: RE | Admit: 2022-09-28 | Discharge: 2022-09-28 | Disposition: A | Discharge status: Home / Self Care | Payer: 59 | Source: Ambulatory Visit | Attending: Adult Health | Admitting: Adult Health

## 2022-09-28 ENCOUNTER — Ambulatory Visit (INDEPENDENT_AMBULATORY_CARE_PROVIDER_SITE_OTHER): Payer: 59 | Admitting: Adult Health

## 2022-09-28 VITALS — BP 127/75 | HR 83 | Ht 62.0 in | Wt 212.0 lb

## 2022-09-28 DIAGNOSIS — Z113 Encounter for screening for infections with a predominantly sexual mode of transmission: Secondary | ICD-10-CM | POA: Diagnosis present

## 2022-09-28 DIAGNOSIS — N898 Other specified noninflammatory disorders of vagina: Secondary | ICD-10-CM | POA: Diagnosis present

## 2022-09-28 NOTE — Progress Notes (Signed)
  Subjective:     Patient ID: [name], female   DOB: 1961/05/27, 62 y.o.   MRN: [id number]  HPI [name] is a 61 year old black female,single, PM in complaining of vaginal discharge, no itching or burning.  Last pap was negative HPV and NILM 11/11/20.  PCP is Dr Legrand Rams  Review of Systems +vaginal discharge Denies any itching or burning Reviewed past medical,surgical, social and family history. Reviewed medications and allergies.     Objective:   Physical Exam BP 127/75 (BP Location: Left Arm, Patient Position: Sitting, Cuff Size: Normal)   Pulse 83   Ht '5\' 2"'$  (1.575 m)   Wt 212 lb (96.2 kg)   LMP 01/15/2012   BMI 38.78 kg/m  Skin warm and dry.Pelvic: external genitalia is normal in appearance no lesions, vagina: scant white discharge without odor,urethra has no lesions or masses noted, cervix:smooth, uterus: normal size, shape and contour, non tender, no masses felt, adnexa: no masses or tenderness noted. Bladder is non tender and no masses felt. CV swab obtained.   Examination chaperoned by Levy Pupa LPN  Fall risk is low, uses cane  [facility] - 09/28/22 1049       Pregnancy Intention Screening   Does the patient want to become pregnant in the next year? No    Does the patient's partner want to become pregnant in the next year? No    Would the patient like to discuss contraceptive options today? No      Contraception Wrap Up   Current Method Female Sterilization   postmenopausal   Reason for No Current Contraceptive Method at Intake ([facility] Only) Other    End Method Female Sterilization   postmenopausal   Contraception Counseling Provided No             Assessment:     1. Vaginal discharge CV swab sent  2. Screening examination for STD (sexually transmitted disease) CV swab sent for GC/CHL,trich,BV and yeast    Plan:     Return in May for physical

## 2022-09-29 LAB — CERVICOVAGINAL ANCILLARY ONLY
Bacterial Vaginitis (gardnerella): NEGATIVE
Candida Glabrata: NEGATIVE
Candida Vaginitis: POSITIVE — AB
Chlamydia: NEGATIVE
Comment: NEGATIVE
Comment: NEGATIVE
Comment: NEGATIVE
Comment: NEGATIVE
Comment: NEGATIVE
Comment: NORMAL
Neisseria Gonorrhea: NEGATIVE
Trichomonas: NEGATIVE

## 2022-09-30 ENCOUNTER — Other Ambulatory Visit: Payer: Self-pay | Admitting: Adult Health

## 2022-09-30 ENCOUNTER — Telehealth: Payer: Self-pay | Admitting: *Deleted

## 2022-09-30 MED ORDER — FLUCONAZOLE 150 MG PO TABS: ORAL_TABLET | ORAL | 1 refills | Status: DC

## 2022-09-30 NOTE — Progress Notes (Signed)
+  yeast on vaginal swab will rx diflucan

## 2022-09-30 NOTE — Telephone Encounter (Signed)
No answer @ 4:42 pm. JSY

## 2022-09-30 NOTE — Telephone Encounter (Signed)
No answer @ 8:53 am. JSY

## 2022-09-30 NOTE — Telephone Encounter (Signed)
-----  Message from Estill Dooms, NP sent at 09/30/2022  7:39 AM EST ----- Please let her know + yeast rx sent for diflucan, other tests negative. THX

## 2022-10-01 NOTE — Telephone Encounter (Signed)
No answer @ 12:22 pm. JSY

## 2022-10-01 NOTE — Telephone Encounter (Signed)
No answer @ 4:54 pm. JSY

## 2022-10-02 NOTE — Telephone Encounter (Signed)
Pt aware yeast on swab. Med sent to pharmacy. Pt voiced understanding. [place]

## 2022-10-13 DIAGNOSIS — H524 Presbyopia: Secondary | ICD-10-CM | POA: Diagnosis not present

## 2022-10-19 DIAGNOSIS — I10 Essential (primary) hypertension: Secondary | ICD-10-CM | POA: Diagnosis not present

## 2022-10-19 DIAGNOSIS — E1122 Type 2 diabetes mellitus with diabetic chronic kidney disease: Secondary | ICD-10-CM | POA: Diagnosis not present

## 2022-10-28 ENCOUNTER — Ambulatory Visit (INDEPENDENT_AMBULATORY_CARE_PROVIDER_SITE_OTHER): Payer: 59 | Admitting: Orthopaedic Surgery

## 2022-10-28 ENCOUNTER — Encounter: Payer: Self-pay | Admitting: Orthopaedic Surgery

## 2022-10-28 DIAGNOSIS — M25511 Pain in right shoulder: Secondary | ICD-10-CM

## 2022-10-28 DIAGNOSIS — F17210 Nicotine dependence, cigarettes, uncomplicated: Secondary | ICD-10-CM

## 2022-10-28 DIAGNOSIS — G8929 Other chronic pain: Secondary | ICD-10-CM | POA: Diagnosis not present

## 2022-10-28 DIAGNOSIS — M25512 Pain in left shoulder: Secondary | ICD-10-CM

## 2022-10-28 MED ORDER — METHYLPREDNISOLONE ACETATE 40 MG/ML IJ SUSP
40.0000 mg | Freq: Once | INTRAMUSCULAR | Status: AC
  Administered 2022-10-28: 40 mg via INTRA_ARTICULAR

## 2022-10-28 NOTE — Addendum Note (Signed)
Addended by: Elizabeth Sauer on: 10/28/2022 08:49 AM   Modules accepted: Orders

## 2022-10-28 NOTE — Progress Notes (Signed)
PROCEDURE NOTE:  The patient request injection, verbal consent was obtained.  The left shoulder was prepped appropriately after time out was performed.   Sterile technique was observed and injection of 1 cc of DepoMedrol '40mg'$  with several cc's of plain xylocaine. Anesthesia was provided by ethyl chloride and a 20-gauge needle was used to inject the shoulder area. A posterior approach was used.  The injection was tolerated well.  A band aid dressing was applied.  The patient was advised to apply ice later today and tomorrow to the injection sight as needed.  PROCEDURE NOTE:  The patient request injection, verbal consent was obtained.  The right shoulder was prepped appropriately after time out was performed.   Sterile technique was observed and injection of 1 cc of DepoMedrol '40mg'$  with several cc's of plain xylocaine. Anesthesia was provided by ethyl chloride and a 20-gauge needle was used to inject the shoulder area. A posterior approach was used.  The injection was tolerated well.  A band aid dressing was applied.  The patient was advised to apply ice later today and tomorrow to the injection sight as needed.   Encounter Diagnoses  Name Primary?   Chronic pain of both shoulders Yes   Cigarette nicotine dependence without complication    Return in six weeks.  Call if any problem.  Precautions discussed.  Electronically Signed Sanjuana Kava, MD 2/28/20248:45 AM

## 2022-11-13 ENCOUNTER — Other Ambulatory Visit (HOSPITAL_COMMUNITY): Payer: Self-pay | Admitting: Internal Medicine

## 2022-11-13 DIAGNOSIS — E118 Type 2 diabetes mellitus with unspecified complications: Secondary | ICD-10-CM | POA: Diagnosis not present

## 2022-11-13 DIAGNOSIS — M1990 Unspecified osteoarthritis, unspecified site: Secondary | ICD-10-CM | POA: Diagnosis not present

## 2022-11-13 DIAGNOSIS — I10 Essential (primary) hypertension: Secondary | ICD-10-CM | POA: Diagnosis not present

## 2022-11-13 DIAGNOSIS — Z1389 Encounter for screening for other disorder: Secondary | ICD-10-CM | POA: Diagnosis not present

## 2022-11-13 DIAGNOSIS — F17210 Nicotine dependence, cigarettes, uncomplicated: Secondary | ICD-10-CM | POA: Diagnosis not present

## 2022-11-13 DIAGNOSIS — Z1231 Encounter for screening mammogram for malignant neoplasm of breast: Secondary | ICD-10-CM

## 2022-11-13 DIAGNOSIS — M21961 Unspecified acquired deformity of right lower leg: Secondary | ICD-10-CM | POA: Diagnosis not present

## 2022-11-13 DIAGNOSIS — E1122 Type 2 diabetes mellitus with diabetic chronic kidney disease: Secondary | ICD-10-CM | POA: Diagnosis not present

## 2022-11-13 DIAGNOSIS — K219 Gastro-esophageal reflux disease without esophagitis: Secondary | ICD-10-CM | POA: Diagnosis not present

## 2022-11-13 DIAGNOSIS — Z0001 Encounter for general adult medical examination with abnormal findings: Secondary | ICD-10-CM | POA: Diagnosis not present

## 2022-12-08 DIAGNOSIS — H40013 Open angle with borderline findings, low risk, bilateral: Secondary | ICD-10-CM | POA: Diagnosis not present

## 2022-12-09 ENCOUNTER — Encounter: Payer: Self-pay | Admitting: Orthopaedic Surgery

## 2022-12-09 ENCOUNTER — Ambulatory Visit (INDEPENDENT_AMBULATORY_CARE_PROVIDER_SITE_OTHER): Payer: 59 | Admitting: Orthopaedic Surgery

## 2022-12-09 DIAGNOSIS — M25512 Pain in left shoulder: Secondary | ICD-10-CM | POA: Diagnosis not present

## 2022-12-09 DIAGNOSIS — F17210 Nicotine dependence, cigarettes, uncomplicated: Secondary | ICD-10-CM

## 2022-12-09 DIAGNOSIS — G8929 Other chronic pain: Secondary | ICD-10-CM

## 2022-12-09 DIAGNOSIS — M25511 Pain in right shoulder: Secondary | ICD-10-CM | POA: Diagnosis not present

## 2022-12-09 MED ORDER — METHYLPREDNISOLONE ACETATE 40 MG/ML IJ SUSP
40.0000 mg | Freq: Once | INTRAMUSCULAR | Status: AC
  Administered 2022-12-09: 40 mg via INTRA_ARTICULAR

## 2022-12-09 NOTE — Progress Notes (Signed)
PROCEDURE NOTE:  The patient request injection, verbal consent was obtained.  The left shoulder was prepped appropriately after time out was performed.   Sterile technique was observed and injection of 1 cc of DepoMedrol 40mg  with several cc's of plain xylocaine. Anesthesia was provided by ethyl chloride and a 20-gauge needle was used to inject the shoulder area. A posterior approach was used.  The injection was tolerated well.  A band aid dressing was applied.  The patient was advised to apply ice later today and tomorrow to the injection sight as needed.   PROCEDURE NOTE:  The patient request injection, verbal consent was obtained.  The right shoulder was prepped appropriately after time out was performed.   Sterile technique was observed and injection of 1 cc of DepoMedrol 40mg  with several cc's of plain xylocaine. Anesthesia was provided by ethyl chloride and a 20-gauge needle was used to inject the shoulder area. A posterior approach was used.  The injection was tolerated well.  A band aid dressing was applied.  The patient was advised to apply ice later today and tomorrow to the injection sight as needed.   Encounter Diagnoses  Name Primary?   Chronic pain of both shoulders Yes   Cigarette nicotine dependence without complication     Return in six weeks.  Call if any problem.  Precautions discussed.  Electronically Signed Darreld Mclean, MD 4/10/20248:57 AM

## 2022-12-14 DIAGNOSIS — I10 Essential (primary) hypertension: Secondary | ICD-10-CM | POA: Diagnosis not present

## 2022-12-14 DIAGNOSIS — E1122 Type 2 diabetes mellitus with diabetic chronic kidney disease: Secondary | ICD-10-CM | POA: Diagnosis not present

## 2023-01-05 ENCOUNTER — Encounter: Payer: 59 | Admitting: Orthopaedic Surgery

## 2023-01-13 DIAGNOSIS — E1122 Type 2 diabetes mellitus with diabetic chronic kidney disease: Secondary | ICD-10-CM | POA: Diagnosis not present

## 2023-01-13 DIAGNOSIS — I10 Essential (primary) hypertension: Secondary | ICD-10-CM | POA: Diagnosis not present

## 2023-01-18 ENCOUNTER — Encounter: Payer: Self-pay | Admitting: Adult Health

## 2023-01-18 ENCOUNTER — Ambulatory Visit (INDEPENDENT_AMBULATORY_CARE_PROVIDER_SITE_OTHER): Payer: 59 | Admitting: Adult Health

## 2023-01-18 VITALS — BP 133/77 | HR 94 | Ht 62.0 in | Wt 213.0 lb

## 2023-01-18 DIAGNOSIS — Z1211 Encounter for screening for malignant neoplasm of colon: Secondary | ICD-10-CM | POA: Diagnosis not present

## 2023-01-18 DIAGNOSIS — Z01419 Encounter for gynecological examination (general) (routine) without abnormal findings: Secondary | ICD-10-CM | POA: Diagnosis not present

## 2023-01-18 LAB — HEMOCCULT GUIAC POC 1CARD (OFFICE): Fecal Occult Blood, POC: NEGATIVE

## 2023-01-18 NOTE — Progress Notes (Signed)
Patient ID: [name], female   DOB: [birth year], 62 y.o.   MRN: [id number] History of Present Illness: [name] is a 62 year old black female,single, PM in for a well woman gyn exam.  PCP is Dr Felecia Shelling.   Current Medications, Allergies, Past Medical History, Past Surgical History, Family History and Social History were reviewed in [employer] record.     Review of Systems: Patient denies any headaches, hearing loss, fatigue, blurred vision, shortness of breath, chest pain, abdominal pain, problems with bowel movements, urination, or intercourse. No joint pain or mood swings.  Has pain in right leg, uses cane    Physical Exam:BP 133/77 (BP Location: Left Arm, Patient Position: Sitting, Cuff Size: Normal)   Pulse 94   Ht 5\' 2"  (1.575 m)   Wt 213 lb (96.6 kg)   LMP 01/15/2012   BMI 38.96 kg/m   General:  Well developed, well nourished, no acute distress Skin:  Warm and dry Neck:  Midline trachea, normal thyroid, good ROM, no lymphadenopathy, no carotid  Lungs; Clear to auscultation bilaterally Breast:  No dominant palpable mass, retraction, or nipple discharge Cardiovascular: Regular rate and rhythm Abdomen:  Soft, non tender, no hepatosplenomegaly Pelvic:  External genitalia is normal in appearance, no lesions.  The vagina is pale. Urethra has no lesions or masses. The cervix is smooth.  Uterus is felt to be normal size, shape, and contour.  No adnexal masses or tenderness noted.Bladder is non tender, no masses felt. Rectal: Good sphincter tone, no polyps, or hemorrhoids felt.  Hemoccult negative. Extremities/musculoskeletal:  No swelling or varicosities noted, no clubbing or cyanosis, has screw trying to come out RLE Psych:  No mood changes, alert and cooperative,seems happy AA is 2  Fall is risk is low    01/18/2023    1:40 PM 01/14/2022    2:32 PM 11/11/2020    2:24 PM  Depression screen PHQ 2/9  Decreased Interest 0 0 0  Down, Depressed, Hopeless  0 0 0  PHQ - 2 Score 0 0 0  Altered sleeping 0 0 0  Tired, decreased energy 0 0 0  Change in appetite 0 0 0  Feeling bad or failure about yourself  0 0 0  Trouble concentrating 0 0 0  Moving slowly or fidgety/restless 0 0 0  Suicidal thoughts 0 0 0  PHQ-9 Score 0 0 0       01/18/2023    1:53 PM 01/14/2022    2:33 PM 11/11/2020    2:24 PM  GAD 7 : Generalized Anxiety Score  Nervous, Anxious, on Edge 0 0 0  Control/stop worrying 0 0 0  Worry too much - different things 0 0 0  Trouble relaxing 0 0 0  Restless 0 0 0  Easily annoyed or irritable 0 0 0  Afraid - awful might happen 0 0 0  Total GAD 7 Score 0 0 0      [facility] - 01/18/23 1338       Pregnancy Intention Screening   Does the patient want to become pregnant in the next year? N/A    Does the patient's partner want to become pregnant in the next year? N/A    Would the patient like to discuss contraceptive options today? N/A      Contraception Wrap Up   Current Method Female Sterilization    End Method Female Sterilization    Contraception Counseling Provided No  Examination chaperoned by Tish RN   Impression and Plan: 1. Encounter for well woman exam with routine gynecological exam Pap and physical in 1 year  Labs with PCP Mammogram was negative 01/23/23 and she has one scheduled for 01/27/23 Colonoscopy per GI  She is going to call her ortho doctor. See PCP for medicare physical   2. Encounter for screening fecal occult blood testing Hemoccult was negative  - POCT occult blood stool

## 2023-01-20 ENCOUNTER — Encounter: Payer: Self-pay | Admitting: Orthopaedic Surgery

## 2023-01-20 ENCOUNTER — Other Ambulatory Visit (INDEPENDENT_AMBULATORY_CARE_PROVIDER_SITE_OTHER): Payer: 59

## 2023-01-20 ENCOUNTER — Ambulatory Visit (INDEPENDENT_AMBULATORY_CARE_PROVIDER_SITE_OTHER): Payer: 59 | Admitting: Orthopaedic Surgery

## 2023-01-20 DIAGNOSIS — G8929 Other chronic pain: Secondary | ICD-10-CM

## 2023-01-20 DIAGNOSIS — F17210 Nicotine dependence, cigarettes, uncomplicated: Secondary | ICD-10-CM | POA: Diagnosis not present

## 2023-01-20 DIAGNOSIS — M79671 Pain in right foot: Secondary | ICD-10-CM

## 2023-01-20 DIAGNOSIS — M25512 Pain in left shoulder: Secondary | ICD-10-CM

## 2023-01-20 DIAGNOSIS — M25511 Pain in right shoulder: Secondary | ICD-10-CM | POA: Diagnosis not present

## 2023-01-20 MED ORDER — METHYLPREDNISOLONE ACETATE 40 MG/ML IJ SUSP
40.0000 mg | Freq: Once | INTRAMUSCULAR | Status: AC
  Administered 2023-01-20: 40 mg via INTRA_ARTICULAR

## 2023-01-20 MED ORDER — METHYLPREDNISOLONE ACETATE 40 MG/ML IJ SUSP: 40.0000 mg | Freq: Once | INTRAMUSCULAR | Status: DC

## 2023-01-20 NOTE — Progress Notes (Signed)
I dropped a TV on my right foot.  She had a TV drop on her right foot last week.  She still has dorsal pain.  She has some swelling.  Exam of the right foot shows some slight dorsal swelling but no ecchymosis.  ROM of toes and ankle is full.  NV intact.  X-rays were done of the right foot, reported separately.  Both shoulders are tender and will be injected today as scheduled.  Encounter Diagnoses  Name Primary?   Acute foot pain, right Yes   Chronic pain of both shoulders    Cigarette nicotine dependence without complication    PROCEDURE NOTE:  The patient request injection, verbal consent was obtained.  The right shoulder was prepped appropriately after time out was performed.   Sterile technique was observed and injection of 1 cc of DepoMedrol 40mg  with several cc's of plain xylocaine. Anesthesia was provided by ethyl chloride and a 20-gauge needle was used to inject the shoulder area. A posterior approach was used.  The injection was tolerated well.  A band aid dressing was applied.  The patient was advised to apply ice later today and tomorrow to the injection sight as needed.   PROCEDURE NOTE:  The patient request injection, verbal consent was obtained.  The left shoulder was prepped appropriately after time out was performed.   Sterile technique was observed and injection of 1 cc of DepoMedrol 40mg  with several cc's of plain xylocaine. Anesthesia was provided by ethyl chloride and a 20-gauge needle was used to inject the shoulder area. A posterior approach was used.  The injection was tolerated well.  A band aid dressing was applied.  The patient was advised to apply ice later today and tomorrow to the injection sight as needed.  Return in six weeks.  Call if any problem.  Precautions discussed.  Electronically Signed Darreld Mclean, MD 5/22/20249:03 AM

## 2023-01-27 ENCOUNTER — Other Ambulatory Visit (HOSPITAL_COMMUNITY)
Admission: RE | Admit: 2023-01-27 | Discharge: 2023-01-27 | Disposition: A | Discharge status: Home / Self Care | Payer: 59 | Source: Ambulatory Visit | Attending: Internal Medicine | Admitting: Internal Medicine

## 2023-01-27 ENCOUNTER — Ambulatory Visit (HOSPITAL_COMMUNITY)
Admission: RE | Admit: 2023-01-27 | Discharge: 2023-01-27 | Disposition: A | Discharge status: Home / Self Care | Payer: 59 | Source: Ambulatory Visit | Attending: Internal Medicine | Admitting: Internal Medicine

## 2023-01-27 DIAGNOSIS — I10 Essential (primary) hypertension: Secondary | ICD-10-CM | POA: Diagnosis not present

## 2023-01-27 DIAGNOSIS — E119 Type 2 diabetes mellitus without complications: Secondary | ICD-10-CM | POA: Diagnosis not present

## 2023-01-27 DIAGNOSIS — Z1231 Encounter for screening mammogram for malignant neoplasm of breast: Secondary | ICD-10-CM | POA: Diagnosis not present

## 2023-01-27 DIAGNOSIS — Z0001 Encounter for general adult medical examination with abnormal findings: Secondary | ICD-10-CM | POA: Diagnosis not present

## 2023-01-27 LAB — CBC WITH DIFFERENTIAL/PLATELET
Abs Immature Granulocytes: 0.03 10*3/uL (ref 0.00–0.07)
Basophils Absolute: 0.1 10*3/uL (ref 0.0–0.1)
Basophils Relative: 1 %
Eosinophils Absolute: 0.2 10*3/uL (ref 0.0–0.5)
Eosinophils Relative: 2 %
HCT: 40.1 % (ref 36.0–46.0)
Hemoglobin: 12.7 g/dL (ref 12.0–15.0)
Immature Granulocytes: 0 %
Lymphocytes Relative: 23 %
Lymphs Abs: 2.9 10*3/uL (ref 0.7–4.0)
MCH: 27.4 pg (ref 26.0–34.0)
MCHC: 31.7 g/dL (ref 30.0–36.0)
MCV: 86.6 fL (ref 80.0–100.0)
Monocytes Absolute: 0.9 10*3/uL (ref 0.1–1.0)
Monocytes Relative: 7 %
Neutro Abs: 8.7 10*3/uL — ABNORMAL HIGH (ref 1.7–7.7)
Neutrophils Relative %: 67 %
Platelets: 327 10*3/uL (ref 150–400)
RBC: 4.63 MIL/uL (ref 3.87–5.11)
RDW: 15.2 % (ref 11.5–15.5)
WBC: 12.9 10*3/uL — ABNORMAL HIGH (ref 4.0–10.5)
nRBC: 0 % (ref 0.0–0.2)

## 2023-01-27 LAB — HEPATIC FUNCTION PANEL
ALT: 21 U/L (ref 0–44)
AST: 18 U/L (ref 15–41)
Albumin: 3.8 g/dL (ref 3.5–5.0)
Alkaline Phosphatase: 107 U/L (ref 38–126)
Bilirubin, Direct: <0.1 mg/dL (ref 0.0–0.2)
Total Bilirubin: 0.3 mg/dL (ref 0.3–1.2)
Total Protein: 7.6 g/dL (ref 6.5–8.1)

## 2023-01-27 LAB — BASIC METABOLIC PANEL
Anion gap: 13 (ref 5–15)
BUN: 18 mg/dL (ref 8–23)
CO2: 22 mmol/L (ref 22–32)
Calcium: 9.2 mg/dL (ref 8.9–10.3)
Chloride: 102 mmol/L (ref 98–111)
Creatinine, Ser: 0.83 mg/dL (ref 0.44–1.00)
GFR, Estimated: >60 mL/min (ref >60)
Glucose, Bld: 100 mg/dL — ABNORMAL HIGH (ref 70–99)
Potassium: 3.9 mmol/L (ref 3.5–5.1)
Sodium: 137 mmol/L (ref 135–145)

## 2023-01-27 LAB — LIPID PANEL
Cholesterol: 159 mg/dL (ref 0–200)
HDL: 68 mg/dL (ref >40)
LDL Cholesterol: 77 mg/dL (ref 0–99)
Total CHOL/HDL Ratio: 2.3 RATIO
Triglycerides: 69 mg/dL (ref <150)
VLDL: 14 mg/dL (ref 0–40)

## 2023-01-28 LAB — HEMOGLOBIN A1C
Hgb A1c MFr Bld: 7 % — ABNORMAL HIGH (ref 4.8–5.6)
Mean Plasma Glucose: 154 mg/dL

## 2023-01-28 LAB — MICROALBUMIN / CREATININE URINE RATIO
Creatinine, Urine: 82.2 mg/dL
Microalb Creat Ratio: 23 mg/g creat (ref 0–29)
Microalb, Ur: 19.2 ug/mL — ABNORMAL HIGH

## 2023-02-05 ENCOUNTER — Telehealth: Payer: Self-pay | Admitting: Orthopaedic Surgery

## 2023-02-05 NOTE — Telephone Encounter (Signed)
Returned the pt's call, just rang and rang, unable to lvm

## 2023-02-09 ENCOUNTER — Telehealth: Payer: Self-pay | Admitting: Radiology

## 2023-02-09 NOTE — Telephone Encounter (Signed)
Patient called, LM asking for appt with Dr Hilda Lias.  I called, NA.  No VM.

## 2023-02-10 DIAGNOSIS — I10 Essential (primary) hypertension: Secondary | ICD-10-CM | POA: Diagnosis not present

## 2023-02-10 DIAGNOSIS — M25511 Pain in right shoulder: Secondary | ICD-10-CM | POA: Diagnosis not present

## 2023-02-10 DIAGNOSIS — E118 Type 2 diabetes mellitus with unspecified complications: Secondary | ICD-10-CM | POA: Diagnosis not present

## 2023-02-16 ENCOUNTER — Ambulatory Visit (HOSPITAL_COMMUNITY)
Admission: RE | Admit: 2023-02-16 | Discharge: 2023-02-16 | Disposition: A | Discharge status: Home / Self Care | Payer: 59 | Source: Ambulatory Visit | Attending: Gerontology | Admitting: Gerontology

## 2023-02-16 ENCOUNTER — Other Ambulatory Visit (HOSPITAL_COMMUNITY): Payer: Self-pay | Admitting: Gerontology

## 2023-02-16 DIAGNOSIS — M25511 Pain in right shoulder: Secondary | ICD-10-CM | POA: Diagnosis not present

## 2023-02-16 DIAGNOSIS — M19012 Primary osteoarthritis, left shoulder: Secondary | ICD-10-CM | POA: Diagnosis not present

## 2023-03-03 ENCOUNTER — Ambulatory Visit (INDEPENDENT_AMBULATORY_CARE_PROVIDER_SITE_OTHER): Payer: 59 | Admitting: Orthopaedic Surgery

## 2023-03-03 ENCOUNTER — Encounter: Payer: Self-pay | Admitting: Orthopaedic Surgery

## 2023-03-03 DIAGNOSIS — G8929 Other chronic pain: Secondary | ICD-10-CM

## 2023-03-03 DIAGNOSIS — M25512 Pain in left shoulder: Secondary | ICD-10-CM

## 2023-03-03 DIAGNOSIS — M25511 Pain in right shoulder: Secondary | ICD-10-CM | POA: Diagnosis not present

## 2023-03-03 MED ORDER — METHYLPREDNISOLONE ACETATE 40 MG/ML IJ SUSP
40.0000 mg | Freq: Once | INTRAMUSCULAR | Status: AC
  Administered 2023-03-03: 40 mg via INTRA_ARTICULAR

## 2023-03-03 NOTE — Progress Notes (Signed)
She had an episode of the left shoulder locking up on her.  She had X-rays showing severe DJD changes. She does not want any surgery.  I have told her to think about a total shoulder.  She will.  PROCEDURE NOTE:  The patient request injection, verbal consent was obtained.  The right shoulder was prepped appropriately after time out was performed.   Sterile technique was observed and injection of 1 cc of DepoMedrol 40mg  with several cc's of plain xylocaine. Anesthesia was provided by ethyl chloride and a 20-gauge needle was used to inject the shoulder area. A posterior approach was used.  The injection was tolerated well.  A band aid dressing was applied.  The patient was advised to apply ice later today and tomorrow to the injection sight as needed.   PROCEDURE NOTE:  The patient request injection, verbal consent was obtained.  The left shoulder was prepped appropriately after time out was performed.   Sterile technique was observed and injection of 1 cc of DepoMedrol 40mg  with several cc's of plain xylocaine. Anesthesia was provided by ethyl chloride and a 20-gauge needle was used to inject the shoulder area. A posterior approach was used.  The injection was tolerated well.  A band aid dressing was applied.  The patient was advised to apply ice later today and tomorrow to the injection sight as needed.   Encounter Diagnosis  Name Primary?   Chronic pain of both shoulders Yes   Return in six weeks.  Call if any problem.  Precautions discussed.  Electronically Signed Darreld Mclean, MD 7/3/20248:10 AM

## 2023-03-12 DIAGNOSIS — I10 Essential (primary) hypertension: Secondary | ICD-10-CM | POA: Diagnosis not present

## 2023-03-12 DIAGNOSIS — E1122 Type 2 diabetes mellitus with diabetic chronic kidney disease: Secondary | ICD-10-CM | POA: Diagnosis not present

## 2023-04-12 DIAGNOSIS — E1122 Type 2 diabetes mellitus with diabetic chronic kidney disease: Secondary | ICD-10-CM | POA: Diagnosis not present

## 2023-04-12 DIAGNOSIS — I10 Essential (primary) hypertension: Secondary | ICD-10-CM | POA: Diagnosis not present

## 2023-04-14 ENCOUNTER — Encounter: Payer: Self-pay | Admitting: Orthopaedic Surgery

## 2023-04-14 ENCOUNTER — Ambulatory Visit (INDEPENDENT_AMBULATORY_CARE_PROVIDER_SITE_OTHER): Payer: 59 | Admitting: Orthopaedic Surgery

## 2023-04-14 DIAGNOSIS — G8929 Other chronic pain: Secondary | ICD-10-CM | POA: Diagnosis not present

## 2023-04-14 DIAGNOSIS — M25512 Pain in left shoulder: Secondary | ICD-10-CM

## 2023-04-14 DIAGNOSIS — F17210 Nicotine dependence, cigarettes, uncomplicated: Secondary | ICD-10-CM

## 2023-04-14 DIAGNOSIS — M25511 Pain in right shoulder: Secondary | ICD-10-CM

## 2023-04-14 MED ORDER — METHYLPREDNISOLONE ACETATE 40 MG/ML IJ SUSP
40.0000 mg | Freq: Once | INTRAMUSCULAR | Status: AC
  Administered 2023-04-14: 40 mg via INTRA_ARTICULAR

## 2023-04-14 NOTE — Progress Notes (Signed)
.  inj

## 2023-04-14 NOTE — Progress Notes (Signed)
PROCEDURE NOTE:  The patient request injection, verbal consent was obtained.  The right shoulder was prepped appropriately after time out was performed.   Sterile technique was observed and injection of 1 cc of DepoMedrol 40mg  with several cc's of plain xylocaine. Anesthesia was provided by ethyl chloride and a 20-gauge needle was used to inject the shoulder area. A posterior approach was used.  The injection was tolerated well.  A band aid dressing was applied.  The patient was advised to apply ice later today and tomorrow to the injection sight as needed.  PROCEDURE NOTE:  The patient request injection, verbal consent was obtained.  The left shoulder was prepped appropriately after time out was performed.   Sterile technique was observed and injection of 1 cc of DepoMedrol 40mg  with several cc's of plain xylocaine. Anesthesia was provided by ethyl chloride and a 20-gauge needle was used to inject the shoulder area. A posterior approach was used.  The injection was tolerated well.  A band aid dressing was applied.  The patient was advised to apply ice later today and tomorrow to the injection sight as needed.   Encounter Diagnoses  Name Primary?   Chronic pain of both shoulders Yes   Cigarette nicotine dependence without complication    She would like to talk to Dr. Dallas Schimke about total shoulder.  We will arrange this.  Return to see me in six weeks.  Call if any problem.  Precautions discussed.  Electronically Signed Darreld Mclean, MD 8/14/20248:06 AM

## 2023-04-14 NOTE — Addendum Note (Signed)
Addended by: Debby Bud R on: 04/14/2023 05:01 PM   Modules accepted: Orders

## 2023-04-20 ENCOUNTER — Ambulatory Visit (INDEPENDENT_AMBULATORY_CARE_PROVIDER_SITE_OTHER): Payer: 59 | Admitting: Orthopedic Surgery

## 2023-04-20 ENCOUNTER — Encounter: Payer: Self-pay | Admitting: Orthopedic Surgery

## 2023-04-20 VITALS — BP 133/84 | HR 111 | Ht 62.0 in | Wt 204.0 lb

## 2023-04-20 DIAGNOSIS — M19012 Primary osteoarthritis, left shoulder: Secondary | ICD-10-CM | POA: Diagnosis not present

## 2023-04-20 NOTE — Progress Notes (Signed)
New Patient Visit  Assessment: [name] is a 62 y.o. female with the following: 1. Glenohumeral arthritis, left   Plan: [name] has severe left glenohumeral joint arthritis.  She is followed by Dr. Hilda Lias.  She will continue with subacromial injections as needed.  She is taking medicines as needed.  We discussed shoulder replacement in great detail.  We reviewed the radiographs.  I provided her with pamphlets outlining the procedure, as well as the plan moving forward.  If she is interested in proceeding with surgery, she will have to abstain from smoking for at least 30 days.  In addition, she will have to wait at least 3 months from her most recent shoulder injection.  She states her understanding.  She understands the reasoning.  All questions have been answered.  She will let the clinic know if she wishes to pursue left shoulder replacement.  Follow-up: Return if symptoms worsen or fail to improve.  Subjective:  Chief Complaint  Patient presents with   Shoulder Pain    L shoulder pain getting worse and shots don't last as long anymore. States the shoulder locks up on her now.    History of Present Illness: [name] is a 62 y.o. female who presents for evaluation of left shoulder pain.  She is right-hand dominant.  She has had persistent left shoulder pain for several years.  She has been followed by Dr. Hilda Lias, and is getting injections in the left shoulder every 6 weeks.  She is also taking ibuprofen for pain.  She has limited motion.  She is starting to have some pain superior aspect of the left shoulder  Review of Systems: No fevers or chills No numbness or tingling No chest pain No shortness of breath No bowel or bladder dysfunction No GI distress No headaches   Medical History:  Past Medical History:  Diagnosis Date   Arthritis    Complication of anesthesia    Diabetes mellitus (HCC)    Type II   DOE (dyspnea on exertion)    Eczema     GERD (gastroesophageal reflux disease)    Glaucoma    Hypercholesterolemia    Hypertension    Infection    r knee   Muscle spasm of back    Muscle spasms of both lower extremities    PONV (postoperative nausea and vomiting)    Positive ANA (antinuclear antibody)    1:180 homogenous pattern   Trichimoniasis 10/04/2019   Treated 10/02/19 PCO______    Past Surgical History:  Procedure Laterality Date   COLONOSCOPY WITH PROPOFOL N/A 06/14/2018   Five sessile polyps in rectum and sigmoid, 4-6 mm in size. Few small and large mouthed diverticula in recto-sigmoid, sigmoid colon, and descending colon. Internal hemorrhoids. Hyperplastic polyps. Colonoscopy in 2024 as prep in right colon not ideal.    IRRIGATION AND DEBRIDEMENT KNEE Right 05/12/2013   Procedure: IRRIGATION AND DEBRIDEMENT KNEE;  Surgeon: Vickki Hearing, MD;  Location: [facility];  Service: Orthopedics;  Laterality: Right;   JOINT REPLACEMENT     r knee   LIPOMA EXCISION  11/16/2011   Procedure: EXCISION LIPOMA;  Surgeon: Marlane Hatcher, MD;  Location: [facility];  Service: General;  Laterality: Left;  Excision of neoplasm left shoulder   MOUTH SURGERY     MULTIPLE EXTRACTIONS WITH ALVEOLOPLASTY  12/28/2011   Procedure: MULTIPLE EXTRACION WITH ALVEOLOPLASTY;  Surgeon: Georgia Lopes, DDS;  Location: [facility];  Service: Oral Surgery;  Laterality: Bilateral;  PATELLAR TENDON REPAIR Right 05/12/2013   Procedure: PATELLA TENDON REPAIR AND ALLOGRAFT RECONSTRUCTION;  Surgeon: Vickki Hearing, MD;  Location: [facility];  Service: Orthopedics;  Laterality: Right;   POLYPECTOMY  06/14/2018   Procedure: POLYPECTOMY;  Surgeon: West Bali, MD;  Location: [facility];  Service: Endoscopy;;   resection arthroplasty right total knee  01/28/16   Dr Rolley Sims, [facility]; antibiotic spacer placed   SHOULDER SURGERY Left    TOOTH EXTRACTION N/A 12/08/2019   Procedure: DENTAL RESTORATION/EXTRACTIONS OF TEETH # SIX, ELEVEN, AND TWENTY-SEVEN;   Surgeon: Ocie Doyne, DDS;  Location: [facility];  Service: Oral Surgery;  Laterality: N/A;   TOTAL KNEE ARTHROPLASTY Right 04/17/2013   Procedure: RIGHT TOTAL KNEE ARTHROPLASTY;  Surgeon: Vickki Hearing, MD;  Location: [facility];  Service: Orthopedics;  Laterality: Right;   TOTAL KNEE ARTHROPLASTY Right 05/12/2013   Procedure: POLY EXCHANGE;  Surgeon: Vickki Hearing, MD;  Location: [facility];  Service: Orthopedics;  Laterality: Right;   TUBAL LIGATION     and burned per patient.     Family History  Problem Relation Age of Onset   Diabetes Mother    Diabetes Brother    Multiple sclerosis Sister    Arthritis Neg Hx    Anesthesia problems Neg Hx    Hypotension Neg Hx    Malignant hyperthermia Neg Hx    Pseudochol deficiency Neg Hx    Cancer Neg Hx    Heart disease Neg Hx    Stroke Neg Hx    Colon cancer Neg Hx    Colon polyps Neg Hx    Social History   Tobacco Use   Smoking status: Some Days    Current packs/day: 0.00    Average packs/day: 0.1 packs/day for 1 year (0.1 ttl pk-yrs)    Types: Cigarettes    Start date: 01/28/2015    Last attempt to quit: 01/28/2016    Years since quitting: 7.2   Smokeless tobacco: Never   Tobacco comments:    rare   Vaping Use   Vaping status: Some Days  Substance Use Topics   Alcohol use: Yes    Alcohol/week: 2.0 standard drinks of alcohol    Types: 2 Cans of beer per week    Comment: "once every blue moon"    Drug use: No    Allergies  Allergen Reactions   Amoxicillin Swelling and Other (See Comments)    Shaking, stomach upset, felt like face was swelling   Chocolate Hives   Nystatin Anxiety    Current Meds  Medication Sig   Accu-Chek FastClix Lancets MISC Apply topically.   ACCU-CHEK GUIDE test strip    amLODipine (NORVASC) 10 MG tablet Take 10 mg by mouth daily.   aspirin 81 MG tablet Take 81 mg by mouth daily.    atorvastatin (LIPITOR) 20 MG tablet Take 20 mg by mouth daily.   bisacodyl (DULCOLAX) 5 MG EC tablet Take 5 mg  daily as needed by mouth for moderate constipation.   Blood Glucose Monitoring Suppl (ACCU-CHEK GUIDE ME) w/Device KIT    cyclobenzaprine (FLEXERIL) 10 MG tablet Take 10 mg by mouth as needed for muscle spasms.    Fluocinolone Acetonide Scalp 0.01 % OIL Apply 1 application topically at bedtime as needed (Eczema in hair).    gabapentin (NEURONTIN) 300 MG capsule Take 300 mg by mouth daily.   hydrochlorothiazide (HYDRODIURIL) 12.5 MG tablet Take 12.5 mg by mouth daily.   IBU 800 MG tablet Take 800 mg  by mouth daily as needed for mild pain or moderate pain.    ketoconazole (NIZORAL) 2 % cream Apply 1 application topically daily.   LINZESS 145 MCG CAPS capsule TAKE ONE CAPSULE BY MOUTH ONCE DAILY BEFORE BREAKFAST.   metFORMIN (GLUCOPHAGE) 500 MG tablet Take 1,000 mg by mouth 2 (two) times daily.    pantoprazole (PROTONIX) 40 MG tablet TAKE 1 TABLET BY MOUTH 30 MINUTES BEFORE BREAKFAST DAILY.   TRADJENTA 5 MG TABS tablet Take 5 mg by mouth daily.   traMADol (ULTRAM) 50 MG tablet Take by mouth in the morning and at bedtime.   travoprost, benzalkonium, (TRAVATAN) 0.004 % ophthalmic solution Place 1 drop into both eyes at bedtime.    zolpidem (AMBIEN) 10 MG tablet Take 1 tablet (10 mg total) by mouth at bedtime as needed for sleep.   Current Facility-Administered Medications for the 04/20/23 encounter (Office Visit) with Oliver Barre, MD  Medication   methylPREDNISolone acetate (DEPO-MEDROL) injection 40 mg    Objective: BP 133/84   Pulse (!) 111   Ht 5\' 2"  (1.575 m)   Wt 204 lb (92.5 kg)   LMP 01/15/2012   BMI 37.31 kg/m   Physical Exam:  General: Alert and oriented. and No acute distress. Gait: Right sided antalgic gait.  She is using a cane in her left hand.  Evaluation left shoulder demonstrates no obvious deformity.  She has limited forward flexion.  Internal rotation to lumbar spine.  Limited external rotation, consistent with the contralateral side.  She has intact sensation in the  axillary nerve distribution.  Sensation is intact throughout the left hand.    IMAGING: I personally reviewed images previously obtained in clinic  X-rays of the left shoulder were previously obtained.  Complete loss of glenohumeral joint space.  Large osteophyte off the inferior humeral head.   New Medications:  No orders of the defined types were placed in this encounter.     Oliver Barre, MD  04/20/2023 11:01 AM

## 2023-04-29 ENCOUNTER — Ambulatory Visit (INDEPENDENT_AMBULATORY_CARE_PROVIDER_SITE_OTHER): Payer: 59 | Admitting: Adult Health

## 2023-04-29 ENCOUNTER — Encounter: Payer: Self-pay | Admitting: Adult Health

## 2023-04-29 ENCOUNTER — Other Ambulatory Visit (HOSPITAL_COMMUNITY)
Admission: RE | Admit: 2023-04-29 | Discharge: 2023-04-29 | Disposition: A | Discharge status: Home / Self Care | Payer: 59 | Source: Ambulatory Visit | Attending: Adult Health | Admitting: Adult Health

## 2023-04-29 VITALS — BP 137/85 | HR 76 | Ht 62.0 in | Wt 206.0 lb

## 2023-04-29 DIAGNOSIS — N898 Other specified noninflammatory disorders of vagina: Secondary | ICD-10-CM | POA: Insufficient documentation

## 2023-04-29 DIAGNOSIS — Z113 Encounter for screening for infections with a predominantly sexual mode of transmission: Secondary | ICD-10-CM | POA: Insufficient documentation

## 2023-04-29 NOTE — Progress Notes (Signed)
  Subjective:     Patient ID: [name], female   DOB: 1961/08/30, 62 y.o.   MRN: [id number]  HPI [name] is a 62 year old black female, single, PM in complaining of heavy vaginal discharge, has had some itching, no odor.     Component Value Date/Time   DIAGPAP  11/11/2020 1431    - Negative for intraepithelial lesion or malignancy (NILM)   DIAGPAP  01/13/2018 0000    NEGATIVE FOR INTRAEPITHELIAL LESIONS OR MALIGNANCY.   DIAGPAP  01/12/2017 0000    NEGATIVE FOR INTRAEPITHELIAL LESIONS OR MALIGNANCY.   HPVHIGH Negative 11/11/2020 1431   ADEQPAP  11/11/2020 1431    Satisfactory for evaluation; transformation zone component ABSENT.   ADEQPAP  01/13/2018 0000    Satisfactory for evaluation  endocervical/transformation zone component ABSENT.   ADEQPAP  01/12/2017 0000    Satisfactory for evaluation  endocervical/transformation zone component PRESENT.    PCP is Dr Felecia Shelling  Review of Systems +vaginal discharge Denies any odor Has had some itching    No new sex partners  Reviewed past medical,surgical, social and family history. Reviewed medications and allergies.  Objective:   Physical Exam BP 137/85 (BP Location: Right Arm, Patient Position: Sitting, Cuff Size: Normal)   Pulse 76   Ht 5\' 2"  (1.575 m)   Wt 206 lb (93.4 kg)   LMP 01/15/2012   BMI 37.68 kg/m     Skin warm and dry.Pelvic: external genitalia is normal in appearance no lesions, vagina: white discharge without odor,urethra has no lesions or masses noted, cervix:smooth, uterus: normal size, shape and contour, non tender, no masses felt, adnexa: no masses or tenderness noted. Bladder is non tender and no masses felt. CV swab obtained. Fall risk is low,she has cane  [facility] - 04/29/23 1541       Pregnancy Intention Screening   Does the patient want to become pregnant in the next year? No    Does the patient's partner want to become pregnant in the next year? No    Would the patient like to discuss  contraceptive options today? No      Contraception Wrap Up   Current Method Female Sterilization    End Method Female Sterilization    Contraception Counseling Provided No            Examination chaperoned by Malachy Mood LPN  IMPRESSION: 1. Vaginal discharge +white discharge, no odor CV Swab sent  - Cervicovaginal ancillary only( [place])  2. Screening examination for STD (sexually transmitted disease) CV swab sent for GC/CHL,trich,BV and yeast  - Cervicovaginal ancillary only( [place])          Plan:     Follow up prn

## 2023-05-05 ENCOUNTER — Telehealth: Payer: Self-pay | Admitting: *Deleted

## 2023-05-05 ENCOUNTER — Other Ambulatory Visit: Payer: Self-pay | Admitting: Adult Health

## 2023-05-05 DIAGNOSIS — A5901 Trichomonal vulvovaginitis: Secondary | ICD-10-CM

## 2023-05-05 MED ORDER — METRONIDAZOLE 500 MG PO TABS: 500.0000 mg | ORAL_TABLET | Freq: Two times a day (BID) | ORAL | 0 refills | Status: DC

## 2023-05-05 NOTE — Telephone Encounter (Signed)
Pt aware swab was + for trich. Will need POC in 2 weeks. Partner needs to be treated. JSY

## 2023-05-05 NOTE — Telephone Encounter (Signed)
+  trich on CV swab rx flagyl, no sex or alcohol POT in 2 weeks

## 2023-05-06 LAB — CERVICOVAGINAL ANCILLARY ONLY
Bacterial Vaginitis (gardnerella): NEGATIVE
Candida Glabrata: NEGATIVE
Candida Vaginitis: NEGATIVE
Chlamydia: NEGATIVE
Comment: NEGATIVE
Comment: NEGATIVE
Comment: NEGATIVE
Comment: NEGATIVE
Comment: NEGATIVE
Comment: NORMAL
Neisseria Gonorrhea: NEGATIVE
Trichomonas: POSITIVE — AB

## 2023-05-12 ENCOUNTER — Encounter: Payer: Self-pay | Admitting: *Deleted

## 2023-05-17 ENCOUNTER — Ambulatory Visit: Payer: 59 | Admitting: Adult Health

## 2023-05-18 ENCOUNTER — Other Ambulatory Visit (HOSPITAL_COMMUNITY)
Admission: RE | Admit: 2023-05-18 | Discharge: 2023-05-18 | Disposition: A | Discharge status: Home / Self Care | Payer: 59 | Source: Ambulatory Visit | Attending: Adult Health | Admitting: Adult Health

## 2023-05-18 ENCOUNTER — Ambulatory Visit: Payer: 59 | Admitting: Adult Health

## 2023-05-18 ENCOUNTER — Encounter: Payer: Self-pay | Admitting: Adult Health

## 2023-05-18 VITALS — BP 122/80 | HR 93 | Ht 62.0 in | Wt 206.0 lb

## 2023-05-18 DIAGNOSIS — Z09 Encounter for follow-up examination after completed treatment for conditions other than malignant neoplasm: Secondary | ICD-10-CM

## 2023-05-18 DIAGNOSIS — A5901 Trichomonal vulvovaginitis: Secondary | ICD-10-CM | POA: Diagnosis not present

## 2023-05-18 NOTE — Progress Notes (Signed)
Subjective:     Patient ID: [name], female   DOB: 05/18/1961, 62 y.o.   MRN: [id number]  HPI [name] is a 62 year old black female,single, PM in for proof of treatment on + trich on vaginal swab, has taken meds and not had sex.     Component Value Date/Time   DIAGPAP  11/11/2020 1431    - Negative for intraepithelial lesion or malignancy (NILM)   DIAGPAP  01/13/2018 0000    NEGATIVE FOR INTRAEPITHELIAL LESIONS OR MALIGNANCY.   DIAGPAP  01/12/2017 0000    NEGATIVE FOR INTRAEPITHELIAL LESIONS OR MALIGNANCY.   HPVHIGH Negative 11/11/2020 1431   ADEQPAP  11/11/2020 1431    Satisfactory for evaluation; transformation zone component ABSENT.   ADEQPAP  01/13/2018 0000    Satisfactory for evaluation  endocervical/transformation zone component ABSENT.   ADEQPAP  01/12/2017 0000    Satisfactory for evaluation  endocervical/transformation zone component PRESENT.    PCP is Dr Felecia Shelling   Review of Systems Denies any discharge Reviewed past medical,surgical, social and family history. Reviewed medications and allergies.     Objective:   Physical Exam BP 122/80 (BP Location: Left Arm, Patient Position: Sitting, Cuff Size: Normal)   Pulse 93   Ht 5\' 2"  (1.575 m)   Wt 206 lb (93.4 kg)   LMP 01/15/2012   BMI 37.68 kg/m      Skin warm and dry.Pelvic: external genitalia is normal in appearance no lesions, vagina: pink, no discharge ,urethra has no lesions or masses noted, cervix:smooth, uterus: normal size, shape and contour, non tender, no masses felt, adnexa: no masses or tenderness noted. Bladder is non tender and no masses felt. CV swab obtained. Examination chaperoned by Malachy Mood LPN  [facility] - 05/18/23 1150       Pregnancy Intention Screening   Does the patient want to become pregnant in the next year? N/A    Does the patient's partner want to become pregnant in the next year? N/A    Would the patient like to discuss contraceptive options today? N/A       Contraception Wrap Up   Current Method Female Sterilization    End Method Female Sterilization    Contraception Counseling Provided No             Assessment:     1. Follow-up exam after treatment CV swab sent for trich  - Cervicovaginal ancillary only( [place])  2. Trichomonas vaginitis CV swab sent for POT Trich  - Cervicovaginal ancillary only( [place])     Plan:     Follow up prn

## 2023-05-19 ENCOUNTER — Telehealth: Payer: Self-pay | Admitting: *Deleted

## 2023-05-19 NOTE — Telephone Encounter (Signed)
-----   Message from Cyril Mourning sent at 05/19/2023  1:31 PM EDT ----- Let her know NO trich on swab. THX

## 2023-05-19 NOTE — Telephone Encounter (Signed)
No answer @ 1:33 pm. JSY

## 2023-05-20 DIAGNOSIS — Z23 Encounter for immunization: Secondary | ICD-10-CM | POA: Diagnosis not present

## 2023-05-20 DIAGNOSIS — M1990 Unspecified osteoarthritis, unspecified site: Secondary | ICD-10-CM | POA: Diagnosis not present

## 2023-05-20 DIAGNOSIS — I10 Essential (primary) hypertension: Secondary | ICD-10-CM | POA: Diagnosis not present

## 2023-05-20 DIAGNOSIS — E118 Type 2 diabetes mellitus with unspecified complications: Secondary | ICD-10-CM | POA: Diagnosis not present

## 2023-05-20 DIAGNOSIS — K219 Gastro-esophageal reflux disease without esophagitis: Secondary | ICD-10-CM | POA: Diagnosis not present

## 2023-05-20 NOTE — Telephone Encounter (Signed)
No answer @ 4:43 pm. JSY

## 2023-05-21 NOTE — Telephone Encounter (Signed)
Pt aware no trich showed up on swab. Pt voiced understanding. JSY

## 2023-05-21 NOTE — Telephone Encounter (Signed)
No answer @ 9:00 am. JSY

## 2023-05-26 ENCOUNTER — Encounter: Payer: Self-pay | Admitting: Orthopaedic Surgery

## 2023-05-26 ENCOUNTER — Ambulatory Visit: Payer: 59 | Admitting: Orthopaedic Surgery

## 2023-05-26 DIAGNOSIS — M25511 Pain in right shoulder: Secondary | ICD-10-CM | POA: Diagnosis not present

## 2023-05-26 DIAGNOSIS — G8929 Other chronic pain: Secondary | ICD-10-CM | POA: Diagnosis not present

## 2023-05-26 DIAGNOSIS — M25512 Pain in left shoulder: Secondary | ICD-10-CM | POA: Diagnosis not present

## 2023-05-26 NOTE — Progress Notes (Signed)
PROCEDURE NOTE:  The patient request injection, verbal consent was obtained.  The right shoulder was prepped appropriately after time out was performed.   Sterile technique was observed and injection of 1 cc of DepoMedrol 40mg  with several cc's of plain xylocaine. Anesthesia was provided by ethyl chloride and a 20-gauge needle was used to inject the shoulder area. A posterior approach was used.  The injection was tolerated well.  A band aid dressing was applied.  The patient was advised to apply ice later today and tomorrow to the injection sight as needed.   PROCEDURE NOTE:  The patient request injection, verbal consent was obtained.  The left shoulder was prepped appropriately after time out was performed.   Sterile technique was observed and injection of 1 cc of DepoMedrol 40mg  with several cc's of plain xylocaine. Anesthesia was provided by ethyl chloride and a 20-gauge needle was used to inject the shoulder area. A posterior approach was used.  The injection was tolerated well.  A band aid dressing was applied.  The patient was advised to apply ice later today and tomorrow to the injection sight as needed.   Encounter Diagnosis  Name Primary?   Chronic pain of both shoulders Yes   Return in six weeks.  Call if any problem.  Precautions discussed.  Electronically Signed Darreld Mclean, MD 9/25/20248:51 AM

## 2023-05-27 DIAGNOSIS — M25511 Pain in right shoulder: Secondary | ICD-10-CM | POA: Diagnosis not present

## 2023-05-27 DIAGNOSIS — G8929 Other chronic pain: Secondary | ICD-10-CM | POA: Diagnosis not present

## 2023-05-27 DIAGNOSIS — M25512 Pain in left shoulder: Secondary | ICD-10-CM | POA: Diagnosis not present

## 2023-05-27 MED ORDER — METHYLPREDNISOLONE ACETATE 40 MG/ML IJ SUSP
40.0000 mg | Freq: Once | INTRAMUSCULAR | Status: AC
  Administered 2023-05-27: 40 mg via INTRA_ARTICULAR

## 2023-05-27 NOTE — Addendum Note (Signed)
Addended by: Michaele Offer on: 05/27/2023 04:47 PM   Modules accepted: Orders

## 2023-05-31 DIAGNOSIS — Z96651 Presence of right artificial knee joint: Secondary | ICD-10-CM | POA: Diagnosis not present

## 2023-05-31 DIAGNOSIS — M25562 Pain in left knee: Secondary | ICD-10-CM | POA: Diagnosis not present

## 2023-05-31 DIAGNOSIS — Z471 Aftercare following joint replacement surgery: Secondary | ICD-10-CM | POA: Diagnosis not present

## 2023-05-31 DIAGNOSIS — M25462 Effusion, left knee: Secondary | ICD-10-CM | POA: Diagnosis not present

## 2023-05-31 DIAGNOSIS — M1712 Unilateral primary osteoarthritis, left knee: Secondary | ICD-10-CM | POA: Diagnosis not present

## 2023-05-31 DIAGNOSIS — T84092A Other mechanical complication of internal right knee prosthesis, initial encounter: Secondary | ICD-10-CM | POA: Diagnosis not present

## 2023-05-31 DIAGNOSIS — G8929 Other chronic pain: Secondary | ICD-10-CM | POA: Diagnosis not present

## 2023-06-02 MED ORDER — METHYLPREDNISOLONE ACETATE 40 MG/ML IJ SUSP
40.0000 mg | Freq: Once | INTRAMUSCULAR | Status: AC
  Administered 2023-06-02: 40 mg via INTRA_ARTICULAR

## 2023-06-02 NOTE — Addendum Note (Signed)
Addended by: Michaele Offer on: 06/02/2023 03:24 PM   Modules accepted: Orders

## 2023-06-19 DIAGNOSIS — I10 Essential (primary) hypertension: Secondary | ICD-10-CM | POA: Diagnosis not present

## 2023-06-19 DIAGNOSIS — E1122 Type 2 diabetes mellitus with diabetic chronic kidney disease: Secondary | ICD-10-CM | POA: Diagnosis not present

## 2023-06-21 DIAGNOSIS — M84361A Stress fracture, right tibia, initial encounter for fracture: Secondary | ICD-10-CM | POA: Diagnosis not present

## 2023-06-28 DIAGNOSIS — H401131 Primary open-angle glaucoma, bilateral, mild stage: Secondary | ICD-10-CM | POA: Diagnosis not present

## 2023-06-28 DIAGNOSIS — E109 Type 1 diabetes mellitus without complications: Secondary | ICD-10-CM | POA: Diagnosis not present

## 2023-06-28 DIAGNOSIS — H04123 Dry eye syndrome of bilateral lacrimal glands: Secondary | ICD-10-CM | POA: Diagnosis not present

## 2023-06-28 DIAGNOSIS — H5213 Myopia, bilateral: Secondary | ICD-10-CM | POA: Diagnosis not present

## 2023-07-01 DIAGNOSIS — Z91018 Allergy to other foods: Secondary | ICD-10-CM | POA: Diagnosis not present

## 2023-07-01 DIAGNOSIS — Z79899 Other long term (current) drug therapy: Secondary | ICD-10-CM | POA: Diagnosis not present

## 2023-07-01 DIAGNOSIS — E119 Type 2 diabetes mellitus without complications: Secondary | ICD-10-CM | POA: Diagnosis not present

## 2023-07-01 DIAGNOSIS — M84361A Stress fracture, right tibia, initial encounter for fracture: Secondary | ICD-10-CM | POA: Diagnosis not present

## 2023-07-01 DIAGNOSIS — Z880 Allergy status to penicillin: Secondary | ICD-10-CM | POA: Diagnosis not present

## 2023-07-01 DIAGNOSIS — Z888 Allergy status to other drugs, medicaments and biological substances status: Secondary | ICD-10-CM | POA: Diagnosis not present

## 2023-07-01 DIAGNOSIS — M9711XA Periprosthetic fracture around internal prosthetic right knee joint, initial encounter: Secondary | ICD-10-CM | POA: Diagnosis not present

## 2023-07-01 DIAGNOSIS — Z7982 Long term (current) use of aspirin: Secondary | ICD-10-CM | POA: Diagnosis not present

## 2023-07-01 DIAGNOSIS — K219 Gastro-esophageal reflux disease without esophagitis: Secondary | ICD-10-CM | POA: Diagnosis not present

## 2023-07-01 DIAGNOSIS — Z7984 Long term (current) use of oral hypoglycemic drugs: Secondary | ICD-10-CM | POA: Diagnosis not present

## 2023-07-07 ENCOUNTER — Ambulatory Visit: Payer: 59 | Admitting: Orthopaedic Surgery

## 2023-07-14 ENCOUNTER — Encounter: Payer: Self-pay | Admitting: Orthopaedic Surgery

## 2023-07-14 ENCOUNTER — Ambulatory Visit (INDEPENDENT_AMBULATORY_CARE_PROVIDER_SITE_OTHER): Payer: 59 | Admitting: Orthopaedic Surgery

## 2023-07-14 VITALS — BP 142/93 | HR 82

## 2023-07-14 DIAGNOSIS — G8929 Other chronic pain: Secondary | ICD-10-CM

## 2023-07-14 DIAGNOSIS — M25512 Pain in left shoulder: Secondary | ICD-10-CM

## 2023-07-14 DIAGNOSIS — M25511 Pain in right shoulder: Secondary | ICD-10-CM

## 2023-07-14 MED ORDER — METHYLPREDNISOLONE ACETATE 40 MG/ML IJ SUSP
40.0000 mg | Freq: Once | INTRAMUSCULAR | Status: AC
  Administered 2023-07-14: 40 mg via INTRA_ARTICULAR

## 2023-07-14 NOTE — Progress Notes (Signed)
PROCEDURE NOTE:  The patient request injection, verbal consent was obtained.  The right shoulder was prepped appropriately after time out was performed.   Sterile technique was observed and injection of 1 cc of DepoMedrol 40mg  with several cc's of plain xylocaine. Anesthesia was provided by ethyl chloride and a 20-gauge needle was used to inject the shoulder area. A posterior approach was used.  The injection was tolerated well.  A band aid dressing was applied.  The patient was advised to apply ice later today and tomorrow to the injection sight as needed.  PROCEDURE NOTE:  The patient request injection, verbal consent was obtained.  The left shoulder was prepped appropriately after time out was performed.   Sterile technique was observed and injection of 1 cc of DepoMedrol 40mg  with several cc's of plain xylocaine. Anesthesia was provided by ethyl chloride and a 20-gauge needle was used to inject the shoulder area. A posterior approach was used.  The injection was tolerated well.  A band aid dressing was applied.  The patient was advised to apply ice later today and tomorrow to the injection sight as needed.  Encounter Diagnosis  Name Primary?   Chronic pain of both shoulders Yes   Return in six weeks.  Call if any problem.  Precautions discussed.  Electronically Signed Darreld Mclean, MD 11/13/20248:39 AM

## 2023-07-14 NOTE — Addendum Note (Signed)
Addended by: Michaele Offer on: 07/14/2023 03:14 PM   Modules accepted: Orders

## 2023-07-20 DIAGNOSIS — E1122 Type 2 diabetes mellitus with diabetic chronic kidney disease: Secondary | ICD-10-CM | POA: Diagnosis not present

## 2023-07-20 DIAGNOSIS — I10 Essential (primary) hypertension: Secondary | ICD-10-CM | POA: Diagnosis not present

## 2023-07-21 DIAGNOSIS — M84361A Stress fracture, right tibia, initial encounter for fracture: Secondary | ICD-10-CM | POA: Diagnosis not present

## 2023-07-26 ENCOUNTER — Other Ambulatory Visit (HOSPITAL_COMMUNITY): Payer: Self-pay | Admitting: Gerontology

## 2023-07-26 ENCOUNTER — Ambulatory Visit (HOSPITAL_COMMUNITY)
Admission: RE | Admit: 2023-07-26 | Discharge: 2023-07-26 | Disposition: A | Discharge status: Home / Self Care | Payer: 59 | Source: Ambulatory Visit | Attending: Gerontology | Admitting: Gerontology

## 2023-07-26 DIAGNOSIS — R059 Cough, unspecified: Secondary | ICD-10-CM

## 2023-07-26 DIAGNOSIS — J929 Pleural plaque without asbestos: Secondary | ICD-10-CM | POA: Diagnosis not present

## 2023-07-27 DIAGNOSIS — R052 Subacute cough: Secondary | ICD-10-CM | POA: Diagnosis not present

## 2023-07-27 DIAGNOSIS — I10 Essential (primary) hypertension: Secondary | ICD-10-CM | POA: Diagnosis not present

## 2023-07-27 DIAGNOSIS — J029 Acute pharyngitis, unspecified: Secondary | ICD-10-CM | POA: Diagnosis not present

## 2023-08-20 ENCOUNTER — Other Ambulatory Visit (HOSPITAL_COMMUNITY): Payer: Self-pay | Admitting: Gerontology

## 2023-08-20 ENCOUNTER — Encounter (HOSPITAL_COMMUNITY): Payer: Self-pay | Admitting: Gerontology

## 2023-08-20 DIAGNOSIS — R52 Pain, unspecified: Secondary | ICD-10-CM

## 2023-08-20 DIAGNOSIS — I10 Essential (primary) hypertension: Secondary | ICD-10-CM | POA: Diagnosis not present

## 2023-08-20 DIAGNOSIS — W19XXXA Unspecified fall, initial encounter: Secondary | ICD-10-CM | POA: Diagnosis not present

## 2023-08-20 DIAGNOSIS — M25511 Pain in right shoulder: Secondary | ICD-10-CM | POA: Diagnosis not present

## 2023-09-02 ENCOUNTER — Ambulatory Visit: Payer: 59 | Admitting: Orthopaedic Surgery

## 2023-09-08 ENCOUNTER — Ambulatory Visit: Payer: 59 | Admitting: Orthopaedic Surgery

## 2023-09-10 DIAGNOSIS — M25562 Pain in left knee: Secondary | ICD-10-CM | POA: Diagnosis not present

## 2023-09-10 DIAGNOSIS — M84361A Stress fracture, right tibia, initial encounter for fracture: Secondary | ICD-10-CM | POA: Diagnosis not present

## 2023-09-16 ENCOUNTER — Ambulatory Visit: Payer: 59 | Admitting: Orthopaedic Surgery

## 2023-09-19 DIAGNOSIS — I10 Essential (primary) hypertension: Secondary | ICD-10-CM | POA: Diagnosis not present

## 2023-09-19 DIAGNOSIS — E1122 Type 2 diabetes mellitus with diabetic chronic kidney disease: Secondary | ICD-10-CM | POA: Diagnosis not present

## 2023-09-23 ENCOUNTER — Ambulatory Visit: Payer: 59 | Admitting: Orthopaedic Surgery

## 2023-09-23 ENCOUNTER — Encounter: Payer: Self-pay | Admitting: Orthopaedic Surgery

## 2023-09-23 DIAGNOSIS — M25512 Pain in left shoulder: Secondary | ICD-10-CM

## 2023-09-23 DIAGNOSIS — M25511 Pain in right shoulder: Secondary | ICD-10-CM

## 2023-09-23 DIAGNOSIS — G8929 Other chronic pain: Secondary | ICD-10-CM | POA: Diagnosis not present

## 2023-09-23 MED ORDER — METHYLPREDNISOLONE ACETATE 40 MG/ML IJ SUSP
40.0000 mg | Freq: Once | INTRAMUSCULAR | Status: AC
  Administered 2023-09-23: 40 mg via INTRA_ARTICULAR

## 2023-09-23 NOTE — Progress Notes (Signed)
PROCEDURE NOTE:  The patient request injection, verbal consent was obtained.  The right shoulder was prepped appropriately after time out was performed.   Sterile technique was observed and injection of 1 cc of DepoMedrol 40mg  with several cc's of plain xylocaine. Anesthesia was provided by ethyl chloride and a 20-gauge needle was used to inject the shoulder area. A posterior approach was used.  The injection was tolerated well.  A band aid dressing was applied.  The patient was advised to apply ice later today and tomorrow to the injection sight as needed.  PROCEDURE NOTE:  The patient request injection, verbal consent was obtained.  The left shoulder was prepped appropriately after time out was performed.   Sterile technique was observed and injection of 1 cc of DepoMedrol 40mg  with several cc's of plain xylocaine. Anesthesia was provided by ethyl chloride and a 20-gauge needle was used to inject the shoulder area. A posterior approach was used.  The injection was tolerated well.  A band aid dressing was applied.  The patient was advised to apply ice later today and tomorrow to the injection sight as needed.   Encounter Diagnosis  Name Primary?   Chronic pain of both shoulders Yes   Return in six weeks.  Call if any problem.  Precautions discussed.  Electronically Signed Darreld Mclean, MD 1/23/20258:06 AM

## 2023-09-24 DIAGNOSIS — H401131 Primary open-angle glaucoma, bilateral, mild stage: Secondary | ICD-10-CM | POA: Diagnosis not present

## 2023-09-24 DIAGNOSIS — H2513 Age-related nuclear cataract, bilateral: Secondary | ICD-10-CM | POA: Diagnosis not present

## 2023-10-11 ENCOUNTER — Ambulatory Visit
Admission: EM | Admit: 2023-10-11 | Discharge: 2023-10-11 | Disposition: A | Discharge status: Home / Self Care | Payer: 59 | Attending: Nurse Practitioner | Admitting: Nurse Practitioner

## 2023-10-11 DIAGNOSIS — J069 Acute upper respiratory infection, unspecified: Secondary | ICD-10-CM | POA: Diagnosis not present

## 2023-10-11 LAB — POCT INFLUENZA A/B
Influenza A, POC: NEGATIVE
Influenza B, POC: NEGATIVE

## 2023-10-11 MED ORDER — ALBUTEROL SULFATE HFA 108 (90 BASE) MCG/ACT IN AERS: 1.0000 | INHALATION_SPRAY | Freq: Four times a day (QID) | RESPIRATORY_TRACT | 0 refills | Status: AC | PRN

## 2023-10-11 MED ORDER — PROMETHAZINE-DM 6.25-15 MG/5ML PO SYRP: 5.0000 mL | ORAL_SOLUTION | Freq: Four times a day (QID) | ORAL | 0 refills | Status: DC | PRN

## 2023-10-11 MED ORDER — AZITHROMYCIN 250 MG PO TABS: 250.0000 mg | ORAL_TABLET | Freq: Every day | ORAL | 0 refills | Status: DC

## 2023-10-11 NOTE — ED Provider Notes (Signed)
 [facility]    CSN: [id number] Arrival date & time: 10/11/23  1125      History   Chief Complaint No chief complaint on file.   HPI [name] is a 63 y.o. female.   HPI  She is in today for evaluation of multiple symptoms.  She reports that she has been exposed to nieces and nephews.  She endorses that her current symptoms are cough, chills, shortness of breath and bodyaches.  She reports that she is having some cough and sputum which makes her nauseated.  She denies history of fever, nasal congestion, headache, dizziness. Past Medical History:  Diagnosis Date   Arthritis    Complication of anesthesia    Diabetes mellitus (HCC)    Type II   DOE (dyspnea on exertion)    Eczema    GERD (gastroesophageal reflux disease)    Glaucoma    Hypercholesterolemia    Hypertension    Infection    r knee   Muscle spasm of back    Muscle spasms of both lower extremities    PONV (postoperative nausea and vomiting)    Positive ANA (antinuclear antibody)    1:180 homogenous pattern   Trichimoniasis 10/04/2019   Treated 10/02/19 PCO______    Patient Active Problem List   Diagnosis Date Noted   Follow-up exam after treatment 05/18/2023   Prolapsed internal hemorrhoids, grade 3 03/26/2022   Encounter for gynecological examination with Papanicolaou smear of cervix 11/11/2020   Screening mammogram for breast cancer 11/11/2020   Encounter for screening fecal occult blood testing 11/11/2020   Routine cervical smear 11/11/2020   Routine medical exam 11/11/2020   Internal hemorrhoids 10/10/2020   Rectal bleeding 10/10/2020   Abdominal pain 08/15/2020   Constipation 08/15/2020   Screening examination for STD (sexually transmitted disease) 04/30/2020   History of trichomoniasis 10/13/2019   Trichomoniasis 10/04/2019   Breast pain, right 10/02/2019   Mass of upper outer quadrant of right breast 10/02/2019   BV (bacterial vaginosis) 10/02/2019   Vaginal odor 10/02/2019    Vaginal discharge 10/02/2019   GERD (gastroesophageal reflux disease) 01/21/2018   Encounter for screening colonoscopy 01/21/2018   Trichomonas vaginitis 06/25/2016   Allergic urticaria 03/04/2016   Rash and nonspecific skin eruption 03/03/2016   Positive ANA (antinuclear antibody) 02/27/2016   Eczema 02/27/2016   Dysmenorrhea 02/19/2016   Tobacco use  02/06/2016   DM2 (diabetes mellitus, type 2) (HCC) 02/06/2016   Infected orthopedic implant (HCC) 11/23/2013   History of knee replacement procedure of right knee 10/10/2013   Rupture patellar tendon 10/10/2013   Postoperative infection of knee (HCC) 10/10/2013   Arthritis of knee, right 03/30/2013   Shoulder mass 08/13/2011    Past Surgical History:  Procedure Laterality Date   COLONOSCOPY WITH PROPOFOL  N/A 06/14/2018   Five sessile polyps in rectum and sigmoid, 4-6 mm in size. Few small and large mouthed diverticula in recto-sigmoid, sigmoid colon, and descending colon. Internal hemorrhoids. Hyperplastic polyps. Colonoscopy in 2024 as prep in right colon not ideal.    IRRIGATION AND DEBRIDEMENT KNEE Right 05/12/2013   Procedure: IRRIGATION AND DEBRIDEMENT KNEE;  Surgeon: Darrin Emerald, MD;  Location: [facility];  Service: Orthopedics;  Laterality: Right;   JOINT REPLACEMENT     r knee   LIPOMA EXCISION  11/16/2011   Procedure: EXCISION LIPOMA;  Surgeon: Myrl Askew, MD;  Location: [facility];  Service: General;  Laterality: Left;  Excision of neoplasm left shoulder   MOUTH SURGERY  MULTIPLE EXTRACTIONS WITH ALVEOLOPLASTY  12/28/2011   Procedure: MULTIPLE EXTRACION WITH ALVEOLOPLASTY;  Surgeon: Cornelia Dieter, DDS;  Location: [facility];  Service: Oral Surgery;  Laterality: Bilateral;   PATELLAR TENDON REPAIR Right 05/12/2013   Procedure: PATELLA TENDON REPAIR AND ALLOGRAFT RECONSTRUCTION;  Surgeon: Darrin Emerald, MD;  Location: [facility];  Service: Orthopedics;  Laterality: Right;   POLYPECTOMY  06/14/2018   Procedure:  POLYPECTOMY;  Surgeon: Alyce Jubilee, MD;  Location: [facility];  Service: Endoscopy;;   resection arthroplasty right total knee  01/28/16   Dr Otila Blizzard, [facility]; antibiotic spacer placed   SHOULDER SURGERY Left    TOOTH EXTRACTION N/A 12/08/2019   Procedure: DENTAL RESTORATION/EXTRACTIONS OF TEETH # SIX, ELEVEN, AND TWENTY-SEVEN;  Surgeon: Ascencion Lava, DDS;  Location: [facility];  Service: Oral Surgery;  Laterality: N/A;   TOTAL KNEE ARTHROPLASTY Right 04/17/2013   Procedure: RIGHT TOTAL KNEE ARTHROPLASTY;  Surgeon: Darrin Emerald, MD;  Location: [facility];  Service: Orthopedics;  Laterality: Right;   TOTAL KNEE ARTHROPLASTY Right 05/12/2013   Procedure: POLY EXCHANGE;  Surgeon: Darrin Emerald, MD;  Location: [facility];  Service: Orthopedics;  Laterality: Right;   TUBAL LIGATION     and burned per patient.     OB History     Gravida  5   Para  5   Term  5   Preterm      AB      Living  6      SAB      IAB      Ectopic      Multiple  1   Live Births               Home Medications    Prior to Admission medications   Medication Sig Start Date End Date Taking? Authorizing Provider  albuterol  (VENTOLIN  HFA) 108 (90 Base) MCG/ACT inhaler Inhale 1-2 puffs into the lungs every 6 (six) hours as needed for wheezing or shortness of breath. 10/11/23  Yes Gregoria Leas, NP  azithromycin  (ZITHROMAX ) 250 MG tablet Take 1 tablet (250 mg total) by mouth daily. Take first 2 tablets together, then 1 every day until finished. 10/11/23  Yes Gregoria Leas, NP  promethazine -dextromethorphan (PROMETHAZINE -DM) 6.25-15 MG/5ML syrup Take 5 mLs by mouth 4 (four) times daily as needed for cough. 10/11/23  Yes Gregoria Leas, NP  Accu-Chek FastClix Lancets MISC Apply topically. 12/03/22   [provider]  ACCU-CHEK GUIDE test strip  10/06/22   [provider]  amLODipine (NORVASC) 10 MG tablet Take 10 mg by mouth daily. 01/14/23   [provider]  aspirin  81 MG  tablet Take 81 mg by mouth daily.     [provider]  atorvastatin (LIPITOR) 20 MG tablet Take 20 mg by mouth daily.    [provider]  bisacodyl  (DULCOLAX) 5 MG EC tablet Take 5 mg daily as needed by mouth for moderate constipation.    [provider]  Blood Glucose Monitoring Suppl (ACCU-CHEK GUIDE ME) w/Device KIT  12/02/22   [provider]  cyclobenzaprine  (FLEXERIL ) 10 MG tablet Take 10 mg by mouth as needed for muscle spasms.     [provider]  Fluocinolone Acetonide Scalp 0.01 % OIL Apply 1 application topically at bedtime as needed (Eczema in hair).  11/16/19   [provider]  gabapentin (NEURONTIN) 300 MG capsule Take 300 mg by mouth daily. 08/14/20   [provider]  hydrochlorothiazide (HYDRODIURIL)  12.5 MG tablet Take 12.5 mg by mouth daily.    [provider]  IBU 800 MG tablet Take 800 mg by mouth daily as needed for mild pain or moderate pain.  05/28/17   [provider]  ketoconazole (NIZORAL) 2 % cream Apply 1 application topically daily.    [provider]  LINZESS  145 MCG CAPS capsule TAKE ONE CAPSULE BY MOUTH ONCE DAILY BEFORE BREAKFAST. 06/30/22   Delman Ferns, NP  metFORMIN (GLUCOPHAGE) 500 MG tablet Take 1,000 mg by mouth 2 (two) times daily.     [provider]  pantoprazole  (PROTONIX ) 40 MG tablet TAKE 1 TABLET BY MOUTH 30 MINUTES BEFORE BREAKFAST DAILY. 04/28/22   Delman Ferns, NP  TRADJENTA 5 MG TABS tablet Take 5 mg by mouth daily. 11/17/22   [provider]  traMADol (ULTRAM) 50 MG tablet Take by mouth in the morning and at bedtime.    [provider]  travoprost , benzalkonium, (TRAVATAN ) 0.004 % ophthalmic solution Place 1 drop into both eyes at bedtime.     [provider]  zolpidem  (AMBIEN ) 10 MG tablet Take 1 tablet (10 mg total) by mouth at bedtime as needed for sleep. 05/25/16   Sarrah Cure, DO    Family History Family History   Problem Relation Age of Onset   Diabetes Mother    Diabetes Brother    Multiple sclerosis Sister    Arthritis Neg Hx    Anesthesia problems Neg Hx    Hypotension Neg Hx    Malignant hyperthermia Neg Hx    Pseudochol deficiency Neg Hx    Cancer Neg Hx    Heart disease Neg Hx    Stroke Neg Hx    Colon cancer Neg Hx    Colon polyps Neg Hx     Social History Social History   Tobacco Use   Smoking status: Some Days    Current packs/day: 0.00    Average packs/day: 0.1 packs/day for 1 year (0.1 ttl pk-yrs)    Types: Cigarettes    Start date: 01/28/2015    Last attempt to quit: 01/28/2016    Years since quitting: 7.7   Smokeless tobacco: Never   Tobacco comments:    rare   Vaping Use   Vaping status: Some Days  Substance Use Topics   Alcohol use: Yes    Alcohol/week: 2.0 standard drinks of alcohol    Types: 2 Cans of beer per week    Comment: "once every blue moon"    Drug use: No     Allergies   Amoxicillin, Chocolate, and Nystatin    Review of Systems Review of Systems   Physical Exam Triage Vital Signs ED Triage Vitals [10/11/23 1351]  Encounter Vitals Group     BP (!) 147/83     Systolic BP Percentile      Diastolic BP Percentile      Pulse Rate 95     Resp 16     Temp 98.1 F (36.7 C)     Temp Source Oral     SpO2 96 %     Weight      Height      Head Circumference      Peak Flow      Pain Score 5     Pain Loc      Pain Education      Exclude from Growth Chart    No data found.  Updated Vital Signs BP (!) 147/83 (BP  Location: Right Arm)   Pulse 95   Temp 98.1 F (36.7 C) (Oral)   Resp 16   LMP 01/15/2012   SpO2 96%   Visual Acuity Right Eye Distance:   Left Eye Distance:   Bilateral Distance:    Right Eye Near:   Left Eye Near:    Bilateral Near:     Physical Exam Constitutional:      General: She is in acute distress.     Appearance: She is obese. She is not ill-appearing, toxic-appearing or diaphoretic.  HENT:     Nose:  Nose normal.     Mouth/Throat:     Mouth: Mucous membranes are moist.  Cardiovascular:     Rate and Rhythm: Normal rate and regular rhythm.     Pulses: Normal pulses.  Pulmonary:     Breath sounds: Wheezing present.     Comments: Coughing   Musculoskeletal:        General: Normal range of motion.     Comments: Cane to assist with ambulation Right lower extremity rod  Skin:    General: Skin is warm.     Capillary Refill: Capillary refill takes less than 2 seconds.  Neurological:     General: No focal deficit present.     Mental Status: She is alert and oriented to person, place, and time.      UC Treatments / Results  Labs (all labs ordered are listed, but only abnormal results are displayed) Labs Reviewed  POCT INFLUENZA A/B    EKG   Radiology No results found.  Procedures Procedures (including critical care time)  Medications Ordered in UC Medications - No data to display  Initial Impression / Assessment and Plan / UC Course  I have reviewed the triage vital signs and the nursing notes.  Pertinent labs & imaging results that were available during my care of the patient were reviewed by me and considered in my medical decision making (see chart for details).     Cough and congestion Final Clinical Impressions(s) / UC Diagnoses   Final diagnoses:  Upper respiratory tract infection, unspecified type     Discharge Instructions      Your rapid flu test was negative.  You have been diagnosed with upper respiratory infection.  You have been prescribed azithromycin  250 mg 2 tablets on day 1 and 1 tablet daily x 4.  You have been prescribed albuterol  inhaler 1 to 2 puffs every 6 hours as needed for wheezing.  You have also been prescribed promethazine  dextromethorphan mL every 4 hours as needed for cough.   If the shortness of breath, or wheezing worsens you are encouraged to go to the emergency room. This is effective to help with nausea associated with cough.   You are encouraged to follow-up with your primary care provider if your symptoms persist or get worse for further evaluation.     ED Prescriptions     Medication Sig Dispense Auth. Provider   albuterol  (VENTOLIN  HFA) 108 (90 Base) MCG/ACT inhaler Inhale 1-2 puffs into the lungs every 6 (six) hours as needed for wheezing or shortness of breath. 6.7 g Gregoria Leas, NP   azithromycin  (ZITHROMAX ) 250 MG tablet Take 1 tablet (250 mg total) by mouth daily. Take first 2 tablets together, then 1 every day until finished. 6 tablet Gregoria Leas, NP   promethazine -dextromethorphan (PROMETHAZINE -DM) 6.25-15 MG/5ML syrup Take 5 mLs by mouth 4 (four) times daily as needed for cough. 180 mL Eleanore Grey Mercer,  NP      PDMP not reviewed this encounter.   Eleanore Grey [place], [place] 10/11/23 [phone]

## 2023-10-11 NOTE — ED Triage Notes (Signed)
 Pt reports she has some coughing, sob, chills, body aches, chest discomfort, sweats, no appetite, and fatigue x 3 days

## 2023-10-11 NOTE — Discharge Instructions (Addendum)
 Your rapid flu test was negative.  You have been diagnosed with upper respiratory infection.  You have been prescribed azithromycin  250 mg 2 tablets on day 1 and 1 tablet daily x 4.  You have been prescribed albuterol  inhaler 1 to 2 puffs every 6 hours as needed for wheezing.  You have also been prescribed promethazine  dextromethorphan mL every 4 hours as needed for cough.   If the shortness of breath, or wheezing worsens you are encouraged to go to the emergency room. This is effective to help with nausea associated with cough.  You are encouraged to follow-up with your primary care provider if your symptoms persist or get worse for further evaluation.

## 2023-10-20 DIAGNOSIS — I10 Essential (primary) hypertension: Secondary | ICD-10-CM | POA: Diagnosis not present

## 2023-10-20 DIAGNOSIS — E1122 Type 2 diabetes mellitus with diabetic chronic kidney disease: Secondary | ICD-10-CM | POA: Diagnosis not present

## 2023-11-03 DIAGNOSIS — E1122 Type 2 diabetes mellitus with diabetic chronic kidney disease: Secondary | ICD-10-CM | POA: Diagnosis not present

## 2023-11-03 DIAGNOSIS — Z0001 Encounter for general adult medical examination with abnormal findings: Secondary | ICD-10-CM | POA: Diagnosis not present

## 2023-11-03 DIAGNOSIS — I10 Essential (primary) hypertension: Secondary | ICD-10-CM | POA: Diagnosis not present

## 2023-11-03 DIAGNOSIS — F17210 Nicotine dependence, cigarettes, uncomplicated: Secondary | ICD-10-CM | POA: Diagnosis not present

## 2023-11-03 DIAGNOSIS — M1990 Unspecified osteoarthritis, unspecified site: Secondary | ICD-10-CM | POA: Diagnosis not present

## 2023-11-03 DIAGNOSIS — Z1389 Encounter for screening for other disorder: Secondary | ICD-10-CM | POA: Diagnosis not present

## 2023-11-03 DIAGNOSIS — E118 Type 2 diabetes mellitus with unspecified complications: Secondary | ICD-10-CM | POA: Diagnosis not present

## 2023-11-03 DIAGNOSIS — K219 Gastro-esophageal reflux disease without esophagitis: Secondary | ICD-10-CM | POA: Diagnosis not present

## 2023-11-03 DIAGNOSIS — M21961 Unspecified acquired deformity of right lower leg: Secondary | ICD-10-CM | POA: Diagnosis not present

## 2023-11-04 ENCOUNTER — Ambulatory Visit: Payer: 59 | Admitting: Orthopaedic Surgery

## 2023-11-10 ENCOUNTER — Ambulatory Visit (INDEPENDENT_AMBULATORY_CARE_PROVIDER_SITE_OTHER): Admitting: Orthopaedic Surgery

## 2023-11-10 ENCOUNTER — Encounter: Payer: Self-pay | Admitting: Orthopaedic Surgery

## 2023-11-10 DIAGNOSIS — M25511 Pain in right shoulder: Secondary | ICD-10-CM

## 2023-11-10 DIAGNOSIS — M25512 Pain in left shoulder: Secondary | ICD-10-CM

## 2023-11-10 DIAGNOSIS — G8929 Other chronic pain: Secondary | ICD-10-CM | POA: Diagnosis not present

## 2023-11-10 DIAGNOSIS — F17210 Nicotine dependence, cigarettes, uncomplicated: Secondary | ICD-10-CM

## 2023-11-10 NOTE — Progress Notes (Signed)
 PROCEDURE NOTE:  The patient request injection, verbal consent was obtained.  The right shoulder was prepped appropriately after time out was performed.   Sterile technique was observed and injection of 1 cc of DepoMedrol 40mg  with several cc's of plain xylocaine. Anesthesia was provided by ethyl chloride and a 20-gauge needle was used to inject the shoulder area. A posterior approach was used.  The injection was tolerated well.  A band aid dressing was applied.  The patient was advised to apply ice later today and tomorrow to the injection sight as needed.  PROCEDURE NOTE:  The patient request injection, verbal consent was obtained.  The left shoulder was prepped appropriately after time out was performed.   Sterile technique was observed and injection of 1 cc of DepoMedrol 40mg  with several cc's of plain xylocaine. Anesthesia was provided by ethyl chloride and a 20-gauge needle was used to inject the shoulder area. A posterior approach was used.  The injection was tolerated well.  A band aid dressing was applied.  The patient was advised to apply ice later today and tomorrow to the injection sight as needed.  Encounter Diagnoses  Name Primary?   Chronic pain of both shoulders Yes   Cigarette nicotine dependence without complication    Return in six weeks.  Call if any problem.  Precautions discussed.  Electronically Signed Darreld Mclean, MD 3/12/20258:04 AM

## 2023-11-25 ENCOUNTER — Other Ambulatory Visit (HOSPITAL_COMMUNITY)
Admission: RE | Admit: 2023-11-25 | Discharge: 2023-11-25 | Disposition: A | Discharge status: Home / Self Care | Source: Ambulatory Visit | Attending: Internal Medicine | Admitting: Internal Medicine

## 2023-11-25 DIAGNOSIS — E119 Type 2 diabetes mellitus without complications: Secondary | ICD-10-CM | POA: Diagnosis not present

## 2023-11-25 DIAGNOSIS — Z0001 Encounter for general adult medical examination with abnormal findings: Secondary | ICD-10-CM | POA: Diagnosis present

## 2023-11-25 DIAGNOSIS — I10 Essential (primary) hypertension: Secondary | ICD-10-CM | POA: Diagnosis not present

## 2023-11-25 DIAGNOSIS — Z79899 Other long term (current) drug therapy: Secondary | ICD-10-CM | POA: Diagnosis not present

## 2023-11-25 DIAGNOSIS — Z1159 Encounter for screening for other viral diseases: Secondary | ICD-10-CM | POA: Insufficient documentation

## 2023-11-25 LAB — CBC WITH DIFFERENTIAL/PLATELET
Abs Immature Granulocytes: 0.02 10*3/uL (ref 0.00–0.07)
Basophils Absolute: 0.1 10*3/uL (ref 0.0–0.1)
Basophils Relative: 1 %
Eosinophils Absolute: 0.3 10*3/uL (ref 0.0–0.5)
Eosinophils Relative: 3 %
HCT: 41.1 % (ref 36.0–46.0)
Hemoglobin: 13 g/dL (ref 12.0–15.0)
Immature Granulocytes: 0 %
Lymphocytes Relative: 25 %
Lymphs Abs: 2.1 10*3/uL (ref 0.7–4.0)
MCH: 27 pg (ref 26.0–34.0)
MCHC: 31.6 g/dL (ref 30.0–36.0)
MCV: 85.3 fL (ref 80.0–100.0)
Monocytes Absolute: 0.7 10*3/uL (ref 0.1–1.0)
Monocytes Relative: 9 %
Neutro Abs: 5.1 10*3/uL (ref 1.7–7.7)
Neutrophils Relative %: 62 %
Platelets: 343 10*3/uL (ref 150–400)
RBC: 4.82 MIL/uL (ref 3.87–5.11)
RDW: 17.1 % — ABNORMAL HIGH (ref 11.5–15.5)
WBC: 8.2 10*3/uL (ref 4.0–10.5)
nRBC: 0 % (ref 0.0–0.2)

## 2023-11-25 LAB — HEPATIC FUNCTION PANEL
ALT: 18 U/L (ref 0–44)
AST: 17 U/L (ref 15–41)
Albumin: 3.7 g/dL (ref 3.5–5.0)
Alkaline Phosphatase: 124 U/L (ref 38–126)
Bilirubin, Direct: <0.1 mg/dL (ref 0.0–0.2)
Total Bilirubin: 0.2 mg/dL (ref 0.0–1.2)
Total Protein: 8.1 g/dL (ref 6.5–8.1)

## 2023-11-25 LAB — LIPID PANEL
Cholesterol: 191 mg/dL (ref 0–200)
HDL: 74 mg/dL (ref >40)
LDL Cholesterol: 106 mg/dL — ABNORMAL HIGH (ref 0–99)
Total CHOL/HDL Ratio: 2.6 ratio
Triglycerides: 55 mg/dL (ref <150)
VLDL: 11 mg/dL (ref 0–40)

## 2023-11-25 LAB — BASIC METABOLIC PANEL WITH GFR
Anion gap: 11 (ref 5–15)
BUN: 16 mg/dL (ref 8–23)
CO2: 21 mmol/L — ABNORMAL LOW (ref 22–32)
Calcium: 9.3 mg/dL (ref 8.9–10.3)
Chloride: 107 mmol/L (ref 98–111)
Creatinine, Ser: 0.84 mg/dL (ref 0.44–1.00)
GFR, Estimated: >60 mL/min (ref >60)
Glucose, Bld: 96 mg/dL (ref 70–99)
Potassium: 3.5 mmol/L (ref 3.5–5.1)
Sodium: 139 mmol/L (ref 135–145)

## 2023-11-25 LAB — HEMOGLOBIN A1C
Hgb A1c MFr Bld: 6.5 % — ABNORMAL HIGH (ref 4.8–5.6)
Mean Plasma Glucose: 139.85 mg/dL

## 2023-11-25 LAB — HEPATITIS B SURFACE ANTIGEN: Hepatitis B Surface Ag: NONREACTIVE

## 2023-11-26 LAB — MICROALBUMIN / CREATININE URINE RATIO
Creatinine, Urine: 198.5 mg/dL
Microalb Creat Ratio: 3 mg/g{creat} (ref 0–29)
Microalb, Ur: 5 ug/mL — ABNORMAL HIGH

## 2023-11-26 LAB — HCV AB W REFLEX TO QUANT PCR: HCV Ab: NONREACTIVE

## 2023-11-26 LAB — HCV INTERPRETATION

## 2023-12-04 DIAGNOSIS — E1122 Type 2 diabetes mellitus with diabetic chronic kidney disease: Secondary | ICD-10-CM | POA: Diagnosis not present

## 2023-12-04 DIAGNOSIS — I10 Essential (primary) hypertension: Secondary | ICD-10-CM | POA: Diagnosis not present

## 2023-12-09 ENCOUNTER — Ambulatory Visit (INDEPENDENT_AMBULATORY_CARE_PROVIDER_SITE_OTHER): Admitting: Adult Health

## 2023-12-09 ENCOUNTER — Other Ambulatory Visit (HOSPITAL_COMMUNITY)
Admission: RE | Admit: 2023-12-09 | Discharge: 2023-12-09 | Disposition: A | Discharge status: Home / Self Care | Source: Ambulatory Visit | Attending: Adult Health | Admitting: Adult Health

## 2023-12-09 ENCOUNTER — Encounter: Payer: Self-pay | Admitting: Adult Health

## 2023-12-09 VITALS — BP 130/73 | HR 81 | Ht 62.0 in | Wt 202.0 lb

## 2023-12-09 DIAGNOSIS — N898 Other specified noninflammatory disorders of vagina: Secondary | ICD-10-CM | POA: Diagnosis not present

## 2023-12-09 NOTE — Progress Notes (Signed)
  Subjective:     Patient ID: [name], female   DOB: 1960/12/08, 63 y.o.   MRN: [id number]  HPI [name] is a 63 year old black female,single, PM in complaining of vaginal discharge, no itching or burning. black female,single, PM in complaining of vaginal discharge, no itching or burning. Had sex last week.    Component Value Date/Time   DIAGPAP  11/11/2020 1431    - Negative for intraepithelial lesion or malignancy (NILM)   DIAGPAP  01/13/2018 0000    NEGATIVE FOR INTRAEPITHELIAL LESIONS OR MALIGNANCY.   DIAGPAP  01/12/2017 0000    NEGATIVE FOR INTRAEPITHELIAL LESIONS OR MALIGNANCY.   HPVHIGH Negative 11/11/2020 1431   ADEQPAP  11/11/2020 1431    Satisfactory for evaluation; transformation zone component ABSENT.   ADEQPAP  01/13/2018 0000    Satisfactory for evaluation  endocervical/transformation zone component ABSENT.   ADEQPAP  01/12/2017 0000    Satisfactory for evaluation  endocervical/transformation zone component PRESENT.   PCP is Dr Felecia Shelling  Review of Systems +vaginal discharge, no itching or burning. Had sex last week.   Reviewed past medical,surgical, social and family history. Reviewed medications and allergies.  Objective:   Physical Exam BP 130/73 (BP Location: Right Arm, Patient Position: Sitting, Cuff Size: Normal)   Pulse 81   Ht 5\' 2"  (1.575 m)   Wt 202 lb (91.6 kg)   LMP 01/15/2012   BMI 36.95 kg/m     Skin warm and dry.Pelvic: external genitalia is normal in appearance no lesions, vagina: white discharge without odor,urethra has no lesions or masses noted, cervix:smooth, uterus: normal size, shape and contour, non tender, no masses felt, adnexa: no masses or tenderness noted. Bladder is non tender and no masses felt. CV swab obtained  Upstream - 12/09/23 1021       Pregnancy Intention Screening   Does the patient want to become pregnant in the next year? N/A    Does the patient's partner want to become pregnant in the next year? N/A    Would the patient like to discuss contraceptive options today? N/A      Contraception Wrap  Up   Current Method Female Sterilization   PM   End Method Female Sterilization   PM   Contraception Counseling Provided No            Examination chaperoned by Malachy Mood LPN  Assessment:     1. Vaginal discharge (Primary) +vaginal discharge CV swab sent for GC/CHL,trich,BV and yeast  - Cervicovaginal ancillary only( [place])     Plan:     Return in about 7 weeks for pap and physical

## 2023-12-13 ENCOUNTER — Telehealth: Payer: Self-pay | Admitting: Adult Health

## 2023-12-13 NOTE — Telephone Encounter (Signed)
 Pt aware CV swab is not back yet. Hopefully we will get results today or tomorrow. Will be in touch once we get results. Pt voiced understanding. JSY

## 2023-12-13 NOTE — Telephone Encounter (Signed)
 Patient called concerning her lab results from 4/10. I let patient know that they haven't been released yet. She wants somebody to call her. Please advise.

## 2023-12-14 ENCOUNTER — Other Ambulatory Visit: Payer: Self-pay | Admitting: Women's Health

## 2023-12-14 ENCOUNTER — Encounter: Payer: Self-pay | Admitting: Women's Health

## 2023-12-14 LAB — CERVICOVAGINAL ANCILLARY ONLY
Bacterial Vaginitis (gardnerella): NEGATIVE
Candida Glabrata: NEGATIVE
Candida Vaginitis: NEGATIVE
Chlamydia: NEGATIVE
Comment: NEGATIVE
Comment: NEGATIVE
Comment: NEGATIVE
Comment: NEGATIVE
Comment: NEGATIVE
Comment: NORMAL
Neisseria Gonorrhea: NEGATIVE
Trichomonas: POSITIVE — AB

## 2023-12-14 MED ORDER — METRONIDAZOLE 500 MG PO TABS: 500.0000 mg | ORAL_TABLET | Freq: Two times a day (BID) | ORAL | 0 refills | Status: DC

## 2023-12-20 DIAGNOSIS — H401131 Primary open-angle glaucoma, bilateral, mild stage: Secondary | ICD-10-CM | POA: Diagnosis not present

## 2023-12-20 DIAGNOSIS — H04123 Dry eye syndrome of bilateral lacrimal glands: Secondary | ICD-10-CM | POA: Diagnosis not present

## 2023-12-22 ENCOUNTER — Encounter: Payer: Self-pay | Admitting: Orthopaedic Surgery

## 2023-12-22 ENCOUNTER — Ambulatory Visit: Admitting: Orthopaedic Surgery

## 2023-12-22 DIAGNOSIS — G8929 Other chronic pain: Secondary | ICD-10-CM

## 2023-12-22 DIAGNOSIS — M25511 Pain in right shoulder: Secondary | ICD-10-CM | POA: Diagnosis not present

## 2023-12-22 DIAGNOSIS — M25512 Pain in left shoulder: Secondary | ICD-10-CM | POA: Diagnosis not present

## 2023-12-22 MED ORDER — METHYLPREDNISOLONE ACETATE 40 MG/ML IJ SUSP
40.0000 mg | Freq: Once | INTRAMUSCULAR | Status: AC
  Administered 2023-12-22: 40 mg via INTRA_ARTICULAR

## 2023-12-22 NOTE — Addendum Note (Signed)
 Addended by: Maryland Snow T on: 12/22/2023 03:25 PM   Modules accepted: Orders

## 2023-12-22 NOTE — Progress Notes (Signed)
 PROCEDURE NOTE:  The patient request injection, verbal consent was obtained.  The right shoulder was prepped appropriately after time out was performed.   Sterile technique was observed and injection of 1 cc of DepoMedrol 40mg  with several cc's of plain xylocaine . Anesthesia was provided by ethyl chloride and a 20-gauge needle was used to inject the shoulder area. A posterior approach was used.  The injection was tolerated well.  A band aid dressing was applied.  The patient was advised to apply ice later today and tomorrow to the injection sight as needed.   PROCEDURE NOTE:  The patient request injection, verbal consent was obtained.  The left shoulder was prepped appropriately after time out was performed.   Sterile technique was observed and injection of 1 cc of DepoMedrol 40mg  with several cc's of plain xylocaine . Anesthesia was provided by ethyl chloride and a 20-gauge needle was used to inject the shoulder area. A posterior approach was used.  The injection was tolerated well.  A band aid dressing was applied.  The patient was advised to apply ice later today and tomorrow to the injection sight as needed.   Encounter Diagnosis  Name Primary?   Chronic pain of both shoulders Yes   Return in six weeks.  Call if any problem.  Precautions discussed.  Electronically Signed Pleasant Brilliant, MD 4/23/20252:53 PM

## 2024-01-03 DIAGNOSIS — E1122 Type 2 diabetes mellitus with diabetic chronic kidney disease: Secondary | ICD-10-CM | POA: Diagnosis not present

## 2024-01-03 DIAGNOSIS — I10 Essential (primary) hypertension: Secondary | ICD-10-CM | POA: Diagnosis not present

## 2024-01-14 ENCOUNTER — Ambulatory Visit: Admitting: *Deleted

## 2024-01-14 DIAGNOSIS — Z09 Encounter for follow-up examination after completed treatment for conditions other than malignant neoplasm: Secondary | ICD-10-CM

## 2024-01-14 DIAGNOSIS — Z8619 Personal history of other infectious and parasitic diseases: Secondary | ICD-10-CM | POA: Diagnosis not present

## 2024-01-14 NOTE — Progress Notes (Signed)
   NURSE VISIT- POC  SUBJECTIVE:  [name] is a 63 y.o. [id number] GYN patientfemale here for a vaginal swab for proof of cure after treatment for Trichomonas.  She reports the following symptoms: none Denies abnormal vaginal bleeding, significant pelvic pain, fever, or UTI symptoms.  OBJECTIVE:  LMP 01/15/2012   Appears well, in no apparent distress  ASSESSMENT: Vaginal swab for proof of cure after treatment for trichomonas  PLAN: Urine sent for Trichomonas  Treatment: to be determined once results are received Follow-up as needed if symptoms persist/worsen, or new symptoms develop  Laverne Potter  01/14/2024 9:59 AM

## 2024-01-18 ENCOUNTER — Ambulatory Visit: Payer: Self-pay | Admitting: Adult Health

## 2024-01-18 LAB — TRICHOMONAS VAGINALIS, PROBE AMP: Trich vag by NAA: NEGATIVE

## 2024-01-27 ENCOUNTER — Ambulatory Visit: Admitting: Adult Health

## 2024-01-27 ENCOUNTER — Encounter: Payer: Self-pay | Admitting: Adult Health

## 2024-01-27 ENCOUNTER — Other Ambulatory Visit (HOSPITAL_COMMUNITY)
Admission: RE | Admit: 2024-01-27 | Discharge: 2024-01-27 | Disposition: A | Discharge status: Home / Self Care | Source: Ambulatory Visit | Attending: Adult Health | Admitting: Adult Health

## 2024-01-27 VITALS — BP 125/79 | HR 93 | Ht 62.0 in | Wt 197.0 lb

## 2024-01-27 DIAGNOSIS — Z1231 Encounter for screening mammogram for malignant neoplasm of breast: Secondary | ICD-10-CM | POA: Diagnosis not present

## 2024-01-27 DIAGNOSIS — Z01419 Encounter for gynecological examination (general) (routine) without abnormal findings: Secondary | ICD-10-CM | POA: Insufficient documentation

## 2024-01-27 DIAGNOSIS — Z1151 Encounter for screening for human papillomavirus (HPV): Secondary | ICD-10-CM | POA: Diagnosis not present

## 2024-01-27 DIAGNOSIS — Z0000 Encounter for general adult medical examination without abnormal findings: Secondary | ICD-10-CM

## 2024-01-27 DIAGNOSIS — Z1211 Encounter for screening for malignant neoplasm of colon: Secondary | ICD-10-CM

## 2024-01-27 DIAGNOSIS — Z1331 Encounter for screening for depression: Secondary | ICD-10-CM | POA: Diagnosis not present

## 2024-01-27 LAB — HEMOCCULT GUIAC POC 1CARD (OFFICE): Fecal Occult Blood, POC: NEGATIVE

## 2024-01-27 NOTE — Progress Notes (Signed)
 Patient ID: [name], female   DOB: [birth year], 63 y.o.   MRN: [id number] History of Present Illness: Onalee is a 63 year old black female,single, PM in for a well woman gyn exam and pap.  PCP is Dr Eldon Greenland    Current Medications, Allergies, Past Medical History, Past Surgical History, Family History and Social History were reviewed in [facility] electronic medical record.     Review of Systems: Patient denies any headaches, hearing loss, fatigue, blurred vision, shortness of breath, chest pain, abdominal pain, problems with bowel movements, urination, or intercourse. No joint pain or mood swings.  May have discharge, feels wet   Physical Exam:BP 125/79 (BP Location: Right Arm, Patient Position: Sitting, Cuff Size: Normal)   Pulse 93   Ht 5\' 2"  (1.575 m)   Wt 197 lb (89.4 kg)   LMP 01/15/2012   BMI 36.03 kg/m   General:  Well developed, well nourished, no acute distress Skin:  Warm and dry Neck:  Midline trachea, normal thyroid, good ROM, no lymphadenopathy,no carotid bruits heard  Lungs; Clear to auscultation bilaterally Breast:  No dominant palpable mass, retraction, or nipple discharge Cardiovascular: Regular rate and rhythm Abdomen:  Soft, non tender, no hepatosplenomegaly Pelvic:  External genitalia is normal in appearance, no lesions.  The vagina is pale, no discharge noted. Urethra has no lesions or masses. The cervix is smooth, pap with GC/CHL, trich and  HR HPV genotyping performed.  Uterus is felt to be normal size, shape, and contour.  No adnexal masses or tenderness noted.Bladder is non tender, no masses felt. Rectal: Good sphincter tone, no polyps, or hemorrhoids felt.  Hemoccult negative. Extremities/musculoskeletal:  No swelling or varicosities noted, no clubbing or cyanosis, right knee does not bend, uses a cane  Psych:  No mood changes, alert and cooperative,seems happy AA is 2 Fall risk is low    01/27/2024    1:37 PM 01/18/2023    1:40 PM 01/14/2022     2:32 PM  Depression screen PHQ 2/9  Decreased Interest 0 0 0  Down, Depressed, Hopeless 0 0 0  PHQ - 2 Score 0 0 0  Altered sleeping 0 0 0  Tired, decreased energy 0 0 0  Change in appetite 0 0 0  Feeling bad or failure about yourself  0 0 0  Trouble concentrating 0 0 0  Moving slowly or fidgety/restless 0 0 0  Suicidal thoughts 0 0 0  PHQ-9 Score 0 0 0       01/27/2024    1:38 PM 01/18/2023    1:53 PM 01/14/2022    2:33 PM 11/11/2020    2:24 PM  GAD 7 : Generalized Anxiety Score  Nervous, Anxious, on Edge 0 0 0 0  Control/stop worrying 0 0 0 0  Worry too much - different things 0 0 0 0  Trouble relaxing 0 0 0 0  Restless 0 0 0 0  Easily annoyed or irritable 0 0 0 0  Afraid - awful might happen 0 0 0 0  Total GAD 7 Score 0 0 0 0    [facility] - 01/27/24 1333       Pregnancy Intention Screening   Does the patient want to become pregnant in the next year? N/A    Does the patient's partner want to become pregnant in the next year? N/A    Would the patient like to discuss contraceptive options today? N/A      Contraception Wrap Up   Current Method  Female Sterilization   PM   End Method Female Sterilization   PM   Contraception Counseling Provided No            Examination chaperoned by Alphonso Aschoff LPN     Impression and Plan: 1. Encounter for gynecological examination with Papanicolaou smear of cervix (Primary) Pap sent Pap in 3 years if normal Physical in 1 year Labs with PCP Mammogram 01/27/23 negative  Colonoscopy per GI  - Cytology - PAP( [facility])  2. Encounter for screening fecal occult blood testing Hemoccult was negative  - POCT occult blood stool  3. Screening mammogram for breast cancer Mammogram scheduled for her 02/09/24 at 10:30 am at [facility]  - MM 3D SCREENING MAMMOGRAM BILATERAL BREAST; Future

## 2024-02-02 ENCOUNTER — Ambulatory Visit: Payer: Self-pay | Admitting: Adult Health

## 2024-02-02 ENCOUNTER — Ambulatory Visit: Admitting: Orthopaedic Surgery

## 2024-02-02 ENCOUNTER — Encounter: Payer: Self-pay | Admitting: Orthopaedic Surgery

## 2024-02-02 DIAGNOSIS — M25511 Pain in right shoulder: Secondary | ICD-10-CM

## 2024-02-02 DIAGNOSIS — M25512 Pain in left shoulder: Secondary | ICD-10-CM | POA: Diagnosis not present

## 2024-02-02 DIAGNOSIS — G8929 Other chronic pain: Secondary | ICD-10-CM | POA: Diagnosis not present

## 2024-02-02 LAB — CYTOLOGY - PAP
Adequacy: ABSENT
Chlamydia: NEGATIVE
Comment: NEGATIVE
Comment: NEGATIVE
Comment: NEGATIVE
Comment: NORMAL
Diagnosis: NEGATIVE
High risk HPV: NEGATIVE
Neisseria Gonorrhea: NEGATIVE
Trichomonas: NEGATIVE

## 2024-02-02 MED ORDER — METHYLPREDNISOLONE ACETATE 40 MG/ML IJ SUSP
40.0000 mg | Freq: Once | INTRAMUSCULAR | Status: AC
  Administered 2024-02-02: 40 mg via INTRA_ARTICULAR

## 2024-02-02 NOTE — Addendum Note (Signed)
 Addended by: Maryland Snow T on: 02/02/2024 11:49 AM   Modules accepted: Orders

## 2024-02-02 NOTE — Telephone Encounter (Signed)
 Pt aware of normal pap and voiced understanding. Next pap due in 3 years. Pt voiced understanding. JSY

## 2024-02-02 NOTE — Telephone Encounter (Signed)
-----   Message from Lendia Quay sent at 02/02/2024  1:38 PM EDT ----- Will you call her about pap, all negative THX

## 2024-02-02 NOTE — Telephone Encounter (Signed)
 Mailbox full @ 2:05 pm. JSY

## 2024-02-02 NOTE — Progress Notes (Signed)
 PROCEDURE NOTE:  The patient request injection, verbal consent was obtained.  The right shoulder was prepped appropriately after time out was performed.   Sterile technique was observed and injection of 1 cc of DepoMedrol 40mg  with several cc's of plain xylocaine . Anesthesia was provided by ethyl chloride and a 20-gauge needle was used to inject the shoulder area. A posterior approach was used.  The injection was tolerated well.  A band aid dressing was applied.  The patient was advised to apply ice later today and tomorrow to the injection sight as needed.  PROCEDURE NOTE:  The patient request injection, verbal consent was obtained.  The left shoulder was prepped appropriately after time out was performed.   Sterile technique was observed and injection of 1 cc of DepoMedrol 40mg  with several cc's of plain xylocaine . Anesthesia was provided by ethyl chloride and a 20-gauge needle was used to inject the shoulder area. A posterior approach was used.  The injection was tolerated well.  A band aid dressing was applied.  The patient was advised to apply ice later today and tomorrow to the injection sight as needed.  Encounter Diagnosis  Name Primary?   Chronic pain of both shoulders Yes   Return in six weeks.  Call if any problem.  Precautions discussed.  Electronically Signed Pleasant Brilliant, MD 6/4/20259:16 AM

## 2024-02-03 DIAGNOSIS — E1122 Type 2 diabetes mellitus with diabetic chronic kidney disease: Secondary | ICD-10-CM | POA: Diagnosis not present

## 2024-02-03 DIAGNOSIS — I10 Essential (primary) hypertension: Secondary | ICD-10-CM | POA: Diagnosis not present

## 2024-02-09 ENCOUNTER — Ambulatory Visit (HOSPITAL_COMMUNITY)
Admission: RE | Admit: 2024-02-09 | Discharge: 2024-02-09 | Disposition: A | Discharge status: Home / Self Care | Source: Ambulatory Visit | Attending: Adult Health | Admitting: Adult Health

## 2024-02-09 DIAGNOSIS — Z1231 Encounter for screening mammogram for malignant neoplasm of breast: Secondary | ICD-10-CM | POA: Insufficient documentation

## 2024-03-04 DIAGNOSIS — E1122 Type 2 diabetes mellitus with diabetic chronic kidney disease: Secondary | ICD-10-CM | POA: Diagnosis not present

## 2024-03-04 DIAGNOSIS — I10 Essential (primary) hypertension: Secondary | ICD-10-CM | POA: Diagnosis not present

## 2024-03-15 ENCOUNTER — Other Ambulatory Visit (HOSPITAL_COMMUNITY)
Admission: RE | Admit: 2024-03-15 | Discharge: 2024-03-15 | Disposition: A | Discharge status: Home / Self Care | Source: Ambulatory Visit | Attending: Obstetrics & Gynecology | Admitting: Obstetrics & Gynecology

## 2024-03-15 ENCOUNTER — Ambulatory Visit (INDEPENDENT_AMBULATORY_CARE_PROVIDER_SITE_OTHER): Admitting: *Deleted

## 2024-03-15 ENCOUNTER — Ambulatory Visit: Admitting: Orthopaedic Surgery

## 2024-03-15 ENCOUNTER — Encounter: Payer: Self-pay | Admitting: Orthopaedic Surgery

## 2024-03-15 ENCOUNTER — Ambulatory Visit: Admitting: Adult Health

## 2024-03-15 DIAGNOSIS — M25511 Pain in right shoulder: Secondary | ICD-10-CM

## 2024-03-15 DIAGNOSIS — G8929 Other chronic pain: Secondary | ICD-10-CM | POA: Diagnosis not present

## 2024-03-15 DIAGNOSIS — M25512 Pain in left shoulder: Secondary | ICD-10-CM

## 2024-03-15 DIAGNOSIS — N898 Other specified noninflammatory disorders of vagina: Secondary | ICD-10-CM | POA: Diagnosis present

## 2024-03-15 DIAGNOSIS — F17210 Nicotine dependence, cigarettes, uncomplicated: Secondary | ICD-10-CM

## 2024-03-15 MED ORDER — METHYLPREDNISOLONE ACETATE 40 MG/ML IJ SUSP
40.0000 mg | Freq: Once | INTRAMUSCULAR | Status: AC
  Administered 2024-03-15: 40 mg via INTRA_ARTICULAR

## 2024-03-15 NOTE — Addendum Note (Signed)
 Addended by: DARRA MILLING R on: 03/15/2024 10:47 AM   Modules accepted: Orders

## 2024-03-15 NOTE — Progress Notes (Signed)
   NURSE VISIT- VAGINITIS/STD  SUBJECTIVE:  [name] is a 63 y.o. [id number] GYN patientfemale here for a vaginal swab for vaginitis screening, STD screen.  She reports the following symptoms: vaginal discharge for few days. No odor.  Denies abnormal vaginal bleeding, significant pelvic pain, fever, or UTI symptoms.  OBJECTIVE:  LMP 01/15/2012   Appears well, in no apparent distress  ASSESSMENT: Vaginal swab for vaginitis screening & STD screening.   PLAN: Self-collected vaginal probe for Gonorrhea, Chlamydia, Trichomonas, Bacterial Vaginosis, Yeast sent to lab Treatment: to be determined once results are received Follow-up as needed if symptoms persist/worsen, or new symptoms develop  Clarita Salt  03/15/2024 3:12 PM

## 2024-03-15 NOTE — Progress Notes (Signed)
 PROCEDURE NOTE:  The patient request injection, verbal consent was obtained.  The right shoulder was prepped appropriately after time out was performed.   Sterile technique was observed and injection of 1 cc of DepoMedrol 40mg  with several cc's of plain xylocaine . Anesthesia was provided by ethyl chloride and a 20-gauge needle was used to inject the shoulder area. A posterior approach was used.  The injection was tolerated well.  A band aid dressing was applied.  The patient was advised to apply ice later today and tomorrow to the injection sight as needed.  PROCEDURE NOTE:  The patient request injection, verbal consent was obtained.  The left shoulder was prepped appropriately after time out was performed.   Sterile technique was observed and injection of 1 cc of DepoMedrol 40mg  with several cc's of plain xylocaine . Anesthesia was provided by ethyl chloride and a 20-gauge needle was used to inject the shoulder area. A posterior approach was used.  The injection was tolerated well.  A band aid dressing was applied.  The patient was advised to apply ice later today and tomorrow to the injection sight as needed.  Encounter Diagnoses  Name Primary?   Chronic pain of both shoulders Yes   Cigarette nicotine dependence without complication    Return in six weeks.  Call if any problem.  Precautions discussed.  Electronically Signed Lemond Stable, MD 7/16/20258:24 AM

## 2024-03-17 ENCOUNTER — Ambulatory Visit: Payer: Self-pay | Admitting: Adult Health

## 2024-03-17 LAB — CERVICOVAGINAL ANCILLARY ONLY
Bacterial Vaginitis (gardnerella): NEGATIVE
Candida Glabrata: NEGATIVE
Candida Vaginitis: POSITIVE — AB
Chlamydia: NEGATIVE
Comment: NEGATIVE
Comment: NEGATIVE
Comment: NEGATIVE
Comment: NEGATIVE
Comment: NEGATIVE
Comment: NORMAL
Neisseria Gonorrhea: NEGATIVE
Trichomonas: NEGATIVE

## 2024-03-17 MED ORDER — FLUCONAZOLE 150 MG PO TABS: ORAL_TABLET | ORAL | 1 refills | Status: DC

## 2024-03-17 NOTE — Telephone Encounter (Signed)
Mail box full @ 1:23 pm. JSY

## 2024-03-17 NOTE — Telephone Encounter (Signed)
-----   Message from [place] sent at 03/17/2024  1:13 PM EDT ----- Let her know about vaginal swab and rx for diflucan  but do not take Lipitor when taking diflucan    THX

## 2024-03-20 DIAGNOSIS — Z96651 Presence of right artificial knee joint: Secondary | ICD-10-CM | POA: Diagnosis not present

## 2024-03-20 DIAGNOSIS — M25561 Pain in right knee: Secondary | ICD-10-CM | POA: Diagnosis not present

## 2024-03-21 ENCOUNTER — Other Ambulatory Visit: Payer: Self-pay | Admitting: Adult Health

## 2024-03-21 NOTE — Telephone Encounter (Signed)
 Pt aware yeast showed up on swab. Diflucan  was sent to pharmacy. Don't take Lipitor when taking Diflucan . Pt voiced understanding. JSY

## 2024-03-21 NOTE — Telephone Encounter (Signed)
-----   Message from [place] sent at 03/17/2024  1:13 PM EDT ----- Let her know about vaginal swab and rx for diflucan  but do not take Lipitor when taking diflucan    THX

## 2024-03-21 NOTE — Telephone Encounter (Signed)
 Mailbox full @ 2:42 pm. JSY

## 2024-03-27 ENCOUNTER — Encounter: Payer: Self-pay | Admitting: Orthopaedic Surgery

## 2024-03-28 DIAGNOSIS — H04123 Dry eye syndrome of bilateral lacrimal glands: Secondary | ICD-10-CM | POA: Diagnosis not present

## 2024-03-28 DIAGNOSIS — H401131 Primary open-angle glaucoma, bilateral, mild stage: Secondary | ICD-10-CM | POA: Diagnosis not present

## 2024-04-04 DIAGNOSIS — E1122 Type 2 diabetes mellitus with diabetic chronic kidney disease: Secondary | ICD-10-CM | POA: Diagnosis not present

## 2024-04-04 DIAGNOSIS — I10 Essential (primary) hypertension: Secondary | ICD-10-CM | POA: Diagnosis not present

## 2024-04-26 ENCOUNTER — Ambulatory Visit (INDEPENDENT_AMBULATORY_CARE_PROVIDER_SITE_OTHER): Admitting: Orthopaedic Surgery

## 2024-04-26 ENCOUNTER — Encounter: Payer: Self-pay | Admitting: Orthopaedic Surgery

## 2024-04-26 DIAGNOSIS — G8929 Other chronic pain: Secondary | ICD-10-CM | POA: Diagnosis not present

## 2024-04-26 DIAGNOSIS — M25511 Pain in right shoulder: Secondary | ICD-10-CM

## 2024-04-26 DIAGNOSIS — M25512 Pain in left shoulder: Secondary | ICD-10-CM | POA: Diagnosis not present

## 2024-04-26 DIAGNOSIS — F17210 Nicotine dependence, cigarettes, uncomplicated: Secondary | ICD-10-CM

## 2024-04-26 MED ORDER — METHYLPREDNISOLONE ACETATE 40 MG/ML IJ SUSP
40.0000 mg | Freq: Once | INTRAMUSCULAR | Status: AC
  Administered 2024-04-26: 40 mg via INTRA_ARTICULAR

## 2024-04-26 NOTE — Addendum Note (Signed)
 Addended by: MARCINE HUSBAND T on: 04/26/2024 01:27 PM   Modules accepted: Orders

## 2024-04-26 NOTE — Progress Notes (Signed)
 PROCEDURE NOTE:  The patient request injection, verbal consent was obtained.  The right shoulder was prepped appropriately after time out was performed.   Sterile technique was observed and injection of 1 cc of DepoMedrol 40mg  with several cc's of plain xylocaine . Anesthesia was provided by ethyl chloride and a 20-gauge needle was used to inject the shoulder area. A posterior approach was used.  The injection was tolerated well.  A band aid dressing was applied.  The patient was advised to apply ice later today and tomorrow to the injection sight as needed.   PROCEDURE NOTE:  The patient request injection, verbal consent was obtained.  The left shoulder was prepped appropriately after time out was performed.   Sterile technique was observed and injection of 1 cc of DepoMedrol 40mg  with several cc's of plain xylocaine . Anesthesia was provided by ethyl chloride and a 20-gauge needle was used to inject the shoulder area. A posterior approach was used.  The injection was tolerated well.  A band aid dressing was applied.  The patient was advised to apply ice later today and tomorrow to the injection sight as needed.   Encounter Diagnoses  Name Primary?   Chronic pain of both shoulders Yes   Cigarette nicotine dependence without complication     Return in two months.  I have informed the patient I will be retiring from medical practice and from this office on June 01, 2024.  The patient has been offered continuing care with Dr. Margrette or Dr. Onesimo of this office.  The patient may choose another provider and the records will be forwarded after proper signature and notification.  Patient understands and agrees.  Call if any problem.  Precautions discussed.  Electronically Signed Lemond Stable, MD 8/27/20259:22 AM

## 2024-05-16 DIAGNOSIS — M1990 Unspecified osteoarthritis, unspecified site: Secondary | ICD-10-CM | POA: Diagnosis not present

## 2024-05-16 DIAGNOSIS — E1142 Type 2 diabetes mellitus with diabetic polyneuropathy: Secondary | ICD-10-CM | POA: Diagnosis not present

## 2024-05-16 DIAGNOSIS — K219 Gastro-esophageal reflux disease without esophagitis: Secondary | ICD-10-CM | POA: Diagnosis not present

## 2024-05-16 DIAGNOSIS — I10 Essential (primary) hypertension: Secondary | ICD-10-CM | POA: Diagnosis not present

## 2024-05-16 DIAGNOSIS — Z23 Encounter for immunization: Secondary | ICD-10-CM | POA: Diagnosis not present

## 2024-06-15 DIAGNOSIS — I10 Essential (primary) hypertension: Secondary | ICD-10-CM | POA: Diagnosis not present

## 2024-06-15 DIAGNOSIS — E1122 Type 2 diabetes mellitus with diabetic chronic kidney disease: Secondary | ICD-10-CM | POA: Diagnosis not present

## 2024-06-27 ENCOUNTER — Encounter: Payer: Self-pay | Admitting: Orthopedic Surgery

## 2024-06-27 ENCOUNTER — Other Ambulatory Visit: Payer: Self-pay

## 2024-06-27 ENCOUNTER — Other Ambulatory Visit (INDEPENDENT_AMBULATORY_CARE_PROVIDER_SITE_OTHER): Payer: Self-pay

## 2024-06-27 ENCOUNTER — Ambulatory Visit: Admitting: Orthopedic Surgery

## 2024-06-27 VITALS — BP 147/85 | HR 94 | Ht 62.0 in | Wt 202.0 lb

## 2024-06-27 DIAGNOSIS — G8929 Other chronic pain: Secondary | ICD-10-CM

## 2024-06-27 DIAGNOSIS — M19011 Primary osteoarthritis, right shoulder: Secondary | ICD-10-CM

## 2024-06-27 DIAGNOSIS — M19012 Primary osteoarthritis, left shoulder: Secondary | ICD-10-CM

## 2024-06-27 NOTE — Patient Instructions (Signed)

## 2024-06-27 NOTE — Progress Notes (Addendum)
 Return patient Visit  Assessment: [name] is a 63 y.o. female with the following: 1. Glenohumeral arthritis, left 2.  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  Left is bothering her more than the right.  We briefly discussed surgery, but she is not interested.  She would prefer to continue with injections.  This was completed in clinic today.  She will follow-up in clinic as needed.  Procedure note injection - Right shoulder    Verbal consent was obtained to inject the right shoulder, subacromial space Timeout was completed to confirm the site of injection.   The skin was prepped with alcohol and ethyl chloride was sprayed at the injection site.  A 21-gauge needle was used to inject 40 mg of Depo-Medrol  and 1% lidocaine  (4 cc) into the subacromial space of the right shoulder using a posterolateral approach.  There were no complications.  A sterile bandage was applied.    Procedure note injection Left shoulder    Verbal consent was obtained to inject the left shoulder, subacromial space Timeout was completed to confirm the site of injection.  The skin was prepped with alcohol and ethyl chloride was sprayed at the injection site.  A 21-gauge needle was used to inject 40 mg of Depo-Medrol  and 1% lidocaine  (4 cc) into the subacromial space of the left shoulder using a posterolateral approach.  There were no complications. A sterile bandage was applied.   Follow-up: Return if symptoms worsen or fail to improve.  Subjective:  Chief Complaint  Patient presents with   Shoulder Pain    Bilat     History of Present Illness: [name] is a 63 y.o. female who returns for evaluation of bilateral shoulder pain.  She is right-hand dominant.  She has been followed by Dr. Brenna for both shoulders.  She has been getting injections of both shoulders every 6 weeks.  Currently, the left is  bothering her more than the right.  She states the left locks up on her.  No recent injuries.  She is not interested in surgery.  Review of Systems: No fevers or chills No numbness or tingling No chest pain No shortness of breath No bowel or bladder dysfunction No GI distress No headaches   Objective: BP (!) 147/85   Pulse 94   Ht 5' 2 (1.575 m)   Wt 202 lb (91.6 kg)   LMP 01/15/2012   BMI 36.95 kg/m   Physical Exam:  General: Alert and oriented. and No acute distress. Gait: Right sided antalgic gait.  She is using a cane in her left hand.  Evaluation left shoulder demonstrates no obvious deformity.  She has limited forward flexion.  Internal rotation to lumbar spine.  Limited external rotation, consistent with the contralateral side.  She has intact sensation in the axillary nerve distribution.  Sensation is intact throughout the left hand.  Well healed surgical incision over the posterior shoulder.   Right shoulder without deformity.  Limited motion.  Sensation intact throughout the right upper extremity.  Fingers are warm and well-perfused.    IMAGING: I personally reviewed images previously obtained in clinic  X-rays of the right shoulder were obtained in clinic today.  No acute injuries.  Severe right glenohumeral joint arthritis, with bone-on-bone articulation.  There is subchondral sclerosis.  There are cysts within the humeral head, as well as the glenoid.  There are inferior osteophytes.  No bony lesions.  Impression:  Severe right glenohumeral joint arthritis  X-rays of the left shoulder were obtained in clinic today.  These are compared to prior x-rays.  There are no acute injuries.  Severe left glenohumeral joint arthritis with bone-on-bone articulation, and subchondral cysts within the humeral head, as well as the glenoid.  There is a large inferior osteophyte off the humeral head.  Impression: Severe left glenohumeral joint   New Medications:  No orders of  the defined types were placed in this encounter.     Oneil DELENA Horde, MD  06/27/2024 9:38 AM

## 2024-08-08 ENCOUNTER — Ambulatory Visit: Admitting: Orthopedic Surgery

## 2024-08-08 ENCOUNTER — Encounter: Payer: Self-pay | Admitting: Orthopedic Surgery

## 2024-08-08 DIAGNOSIS — M19012 Primary osteoarthritis, left shoulder: Secondary | ICD-10-CM

## 2024-08-08 DIAGNOSIS — M19011 Primary osteoarthritis, right shoulder: Secondary | ICD-10-CM

## 2024-08-08 NOTE — Progress Notes (Signed)
 Return patient Visit  Assessment: XIAMARA HULET is a 63 y.o. female with the following: 1. Glenohumeral arthritis, left 2.  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  Left is bothering her more than the right.  She is not interested in surgery.  Injections have been effective for a week or 2.  She would like to proceed with repeat injections today.   Procedure note injection - Right shoulder    Verbal consent was obtained to inject the right shoulder, subacromial space Timeout was completed to confirm the site of injection.   The skin was prepped with alcohol and ethyl chloride was sprayed at the injection site.  A 21-gauge needle was used to inject 40 mg of Depo-Medrol  and 1% lidocaine  (4 cc) into the subacromial space of the right shoulder using a posterolateral approach.  There were no complications.  A sterile bandage was applied.    Procedure note injection Left shoulder    Verbal consent was obtained to inject the left shoulder, subacromial space Timeout was completed to confirm the site of injection.  The skin was prepped with alcohol and ethyl chloride was sprayed at the injection site.  A 21-gauge needle was used to inject 40 mg of Depo-Medrol  and 1% lidocaine  (4 cc) into the subacromial space of the left shoulder using a posterolateral approach.  There were no complications. A sterile bandage was applied.   Follow-up: Return if symptoms worsen or fail to improve.  Subjective:  Chief Complaint  Patient presents with   Shoulder Pain    Bilat   NDC:[address]-1    History of Present Illness: DEANNE BEDGOOD is a 63 y.o. female who returns for evaluation of bilateral shoulder pain.  She is right-hand dominant.  She remains uninterested in surgery.  She has advanced degenerative changes in bilateral shoulders.  Injections provide some improvement in his symptoms for a couple of  weeks.  She denies numbness and tingling.  Review of Systems: No fevers or chills No numbness or tingling No chest pain No shortness of breath No bowel or bladder dysfunction No GI distress No headaches   Objective: LMP 01/15/2012   Physical Exam:  General: Alert and oriented. and No acute distress. Gait: Right sided antalgic gait.  She is using a cane in her left hand.  Evaluation left shoulder demonstrates no obvious deformity.  She has limited forward flexion.  Internal rotation to lumbar spine.  Limited external rotation, consistent with the contralateral side.  She has intact sensation in the axillary nerve distribution.  Sensation is intact throughout the left hand.  Well healed surgical incision over the posterior shoulder.   Right shoulder without deformity.  Limited motion.  Sensation intact throughout the right upper extremity.  Fingers are warm and well-perfused.    IMAGING: I personally reviewed images previously obtained in clinic  No new imaging obtained today.   New Medications:  No orders of the defined types were placed in this encounter.     Oneil DELENA Horde, MD  08/08/2024 1:18 PM

## 2024-08-10 ENCOUNTER — Ambulatory Visit (HOSPITAL_COMMUNITY)
Admission: RE | Admit: 2024-08-10 | Discharge: 2024-08-10 | Disposition: A | Discharge status: Home / Self Care | Source: Ambulatory Visit | Attending: Gerontology | Admitting: Gerontology

## 2024-08-10 ENCOUNTER — Other Ambulatory Visit (HOSPITAL_COMMUNITY): Payer: Self-pay | Admitting: Gerontology

## 2024-08-10 DIAGNOSIS — R059 Cough, unspecified: Secondary | ICD-10-CM | POA: Insufficient documentation

## 2024-09-05 ENCOUNTER — Encounter: Payer: Self-pay | Admitting: Adult Health

## 2024-09-05 ENCOUNTER — Ambulatory Visit: Admitting: Adult Health

## 2024-09-05 ENCOUNTER — Other Ambulatory Visit (HOSPITAL_COMMUNITY)
Admission: RE | Admit: 2024-09-05 | Discharge: 2024-09-05 | Disposition: A | Discharge status: Home / Self Care | Source: Ambulatory Visit | Attending: Adult Health | Admitting: Adult Health

## 2024-09-05 VITALS — BP 148/79 | HR 92 | Ht 62.0 in | Wt 210.5 lb

## 2024-09-05 DIAGNOSIS — N898 Other specified noninflammatory disorders of vagina: Secondary | ICD-10-CM | POA: Diagnosis present

## 2024-09-05 DIAGNOSIS — I10 Essential (primary) hypertension: Secondary | ICD-10-CM

## 2024-09-05 NOTE — Progress Notes (Signed)
" °  Subjective:     Patient ID: [name], female   DOB: November 17, 1960, 64 y.o.   MRN: [id number]  HPI [name] is a 64 year old black female, single, PM in complaining of vaginal discharge, noticed after Christmas. Denies any odor, itching or burning. And no new sex partners.     Component Value Date/Time   DIAGPAP  01/27/2024 1334    - Negative for intraepithelial lesion or malignancy (NILM)   DIAGPAP  11/11/2020 1431    - Negative for intraepithelial lesion or malignancy (NILM)   DIAGPAP  01/13/2018 0000    NEGATIVE FOR INTRAEPITHELIAL LESIONS OR MALIGNANCY.   HPVHIGH Negative 01/27/2024 1334   HPVHIGH Negative 11/11/2020 1431   ADEQPAP  01/27/2024 1334    Satisfactory for evaluation; transformation zone component ABSENT.   ADEQPAP  11/11/2020 1431    Satisfactory for evaluation; transformation zone component ABSENT.   ADEQPAP  01/13/2018 0000    Satisfactory for evaluation  endocervical/transformation zone component ABSENT.    PCP is Dr Carlette Review of Systems +vaginal discharge, noticed after Christmas.  Denies any odor, itching or burning. And no new sex partners.   Reviewed past medical,surgical, social and family history. Reviewed medications and allergies.  Objective:   Physical Exam BP (!) 148/79 (BP Location: Left Arm, Patient Position: Sitting, Cuff Size: Normal)   Pulse 92   Ht 5' 2 (1.575 m)   Wt 210 lb 8 oz (95.5 kg)   LMP 01/15/2012   BMI 38.50 kg/m     Skin warm and dry.Pelvic: external genitalia is normal in appearance no lesions, vagina:scant white discharge without odor,urethra has no lesions or masses noted, cervix:smooth, uterus: normal size, shape and contour, non tender, no masses felt, adnexa: no masses or tenderness noted. Bladder is non tender and no masses felt. CV swab obtained. Fall risk is low  Upstream - 09/05/24 1418       Pregnancy Intention Screening   Does the patient want to become pregnant in the next year? N/A    Does the  patient's partner want to become pregnant in the next year? N/A    Would the patient like to discuss contraceptive options today? N/A      Contraception Wrap Up   Current Method Female Sterilization;Post-Menopause    End Method Female Sterilization;Post-Menopause    Contraception Counseling Provided No         Examination chaperoned by Clarita Salt LPN  Assessment:     1. Vaginal discharge (Primary) +vaginal discharge, noticed after Christmas. Denies any odor, itching or burning. CV swab sent for BV, yeast and trich - Cervicovaginal ancillary only( [place])  2. Hypertension, unspecified type Continue meds and follow up with PCP    Plan:     Follow up prn     "

## 2024-09-07 ENCOUNTER — Ambulatory Visit: Payer: Self-pay | Admitting: Adult Health

## 2024-09-07 LAB — CERVICOVAGINAL ANCILLARY ONLY
Bacterial Vaginitis (gardnerella): NEGATIVE
Candida Glabrata: NEGATIVE
Candida Vaginitis: POSITIVE — AB
Comment: NEGATIVE
Comment: NEGATIVE
Comment: NEGATIVE
Comment: NEGATIVE
Trichomonas: NEGATIVE

## 2024-09-07 MED ORDER — FLUCONAZOLE 150 MG PO TABS: ORAL_TABLET | ORAL | 1 refills | Status: DC

## 2024-09-07 NOTE — Telephone Encounter (Signed)
 Pt aware swab was + for yeast and Diflucan  was sent in. Pt voiced understanding. JSY

## 2024-09-07 NOTE — Telephone Encounter (Signed)
-----   Message from Delon Lewis, NP sent at 09/07/2024 12:17 PM EST ----- Call Wanda and let her know + yeast and rx sent in for diflucan . THX

## 2024-09-19 ENCOUNTER — Encounter: Payer: Self-pay | Admitting: Orthopedic Surgery

## 2024-09-19 ENCOUNTER — Ambulatory Visit: Admitting: Orthopedic Surgery

## 2024-09-19 DIAGNOSIS — M19012 Primary osteoarthritis, left shoulder: Secondary | ICD-10-CM

## 2024-09-19 DIAGNOSIS — M19011 Primary osteoarthritis, right shoulder: Secondary | ICD-10-CM | POA: Diagnosis not present

## 2024-09-19 NOTE — Progress Notes (Signed)
 Return patient Visit  Assessment: [name] is a 64 y.o. female with the following: 1. Glenohumeral arthritis, left 2.  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  She has had several injections in bilateral shoulders.  Currently, she states injections are only helping for a few days.  We have previously discussed surgery.  After discussing this in clinic today, she is interested in proceeding with right shoulder arthroplasty.  Procedure, risks and benefits were all discussed in clinic today.  We also briefly discussed the possibility proceeding with repeat injections today.  After informing her that surgery would have to wait for at least 3 months following an injection, she has stated that she would like to proceed with surgery on her right shoulder.  Neck step is to obtain medical clearance.  Our office will reach out to her PCP.  We would then need to schedule a CT scan.  Once we have all of this taken care of, we will work toward scheduling a surgery date.  All questions have been answered.  Chronic Right shoulder pain and dysfunction with imaging-confirmed glenohumeral osteoarthritis. Failed nonoperative management. Surgical intervention indicated due to persistent symptoms and functional limitation. Choice between anatomic and reverse shoulder arthroplasty depends on CT and intraoperative findings. Anticipated outcome: improved pain and function. Risks: infection, blood loss, neurovascular injury, implant loosening, stiffness, instability, fracture, anesthesia complications. - Initiate medical clearance for surgery with primary care provider. - Provided educational materials on CT imaging and shoulder arthroplasty. - Will order Rightshoulder CT for anatomical assessment and preoperative planning. - Will determine type of shoulder arthroplasty based on CT and intraoperative findings. - Plan for shoulder  arthroplasty post-medical clearance and CT evaluation. - Discussed perioperative pain management, including postoperative analgesics tapering by six months unless complications. - Advised on postoperative rehabilitation: sling immobilization for one month, early physical therapy for range of motion, progressive strengthening over four to six months. - Discussed need for postoperative home assistance during early recovery.     Follow-up: Return for After surgery.  Subjective:  Chief Complaint  Patient presents with   Injections    Bilat shoulders     History of Present Illness: [name] is a 64 y.o. female who returns for evaluation of bilateral shoulder pain.  She is right-hand dominant.  Injections did not provide sustained relief.  Most recent injections helped with her pain for only a couple of days.  These injections were approximately 6 weeks ago.  She states that she is considering surgery.  Review of Systems: No fevers or chills No numbness or tingling No chest pain No shortness of breath No bowel or bladder dysfunction No GI distress No headaches   Objective: LMP 01/15/2012   Physical Exam:  General: Alert and oriented. and No acute distress. Gait: Right sided antalgic gait.  She is using a cane in her left hand.  Evaluation of the right shoulder demonstrates no deformity.  Limited motion due to pain.  Sensation intact throughout the right upper extremity.  Incision intact the axillary nerve distribution.  Fingers are warm and well-perfused.    IMAGING: I personally reviewed images previously obtained in clinic  No new imaging obtained today.   New Medications:  No orders of the defined types were placed in this encounter.     Oneil DELENA Horde, MD  09/19/2024 3:45 PM

## 2024-09-19 NOTE — Patient Instructions (Signed)
 Preoperative Instructions  Your surgery will be at [facility], scheduled with Dr Oneil Horde   The hospital will contact you with a preoperative appointment to discuss Anesthesia. The phone number is [phone]   Please bring your medications with you for the appointment.  They will tell you the arrival time and medication instructions when you have your preoperative evaluation.  Do not wear nail polish the day of your surgery and if you take Phentermine you need to stop this medication ONE WEEK prior to your surgery.    If you take an blood thinning medication, we will need to stop this prior to surgery.  Typically, we stop this medicine at least 5 days prior to surgery.  We will need to confirm this with the doctor who prescribes this medication.  If you are taking medications or an injection for diabetes, or for weight management, this medicine will need to be stopped at least 7 days prior to surgery.     Surgery will be scheduled for TO BE DETERMINED pending authorization by your insurance company.   Pain medicine policy:  Per [facility] clinic policy, our goal is ensure optimal postoperative pain control with a multimodal pain management strategy.   For all [facility] patients, our goal is to wean post-operative narcotic medications by 6 weeks post-operatively.   If this is not possible due to utilization of pain medication prior to surgery, your [facility] doctor will support your acute post-operative pain control for the first 6 weeks postoperatively, with a plan to transition you back to your primary pain team following that.   [facility] will work to ensure a therapist, occupational.

## 2024-09-20 ENCOUNTER — Other Ambulatory Visit: Payer: Self-pay | Admitting: Adult Health

## 2024-09-26 ENCOUNTER — Telehealth: Payer: Self-pay | Admitting: Orthopedic Surgery

## 2024-09-26 NOTE — Telephone Encounter (Signed)
 Dr. Onesimo pt - pt lvm stating that she was not going to be do the surgery, her kitchen is getting remodeled and she has a lot to pack up.  [phone]
